# Patient Record
Sex: Female | Born: 1960 | Race: White | Hispanic: No | Marital: Married | Smoking: Never smoker
Health system: Southern US, Community
[De-identification: ages and names within clinical notes are randomized; demographics above are authoritative.]

## PROBLEM LIST (undated history)

## (undated) DIAGNOSIS — R112 Nausea with vomiting, unspecified: Secondary | ICD-10-CM

## (undated) DIAGNOSIS — Z8489 Family history of other specified conditions: Secondary | ICD-10-CM

## (undated) DIAGNOSIS — G4730 Sleep apnea, unspecified: Secondary | ICD-10-CM

## (undated) DIAGNOSIS — Z8639 Personal history of other endocrine, nutritional and metabolic disease: Secondary | ICD-10-CM

## (undated) DIAGNOSIS — F329 Major depressive disorder, single episode, unspecified: Secondary | ICD-10-CM

## (undated) DIAGNOSIS — M1990 Unspecified osteoarthritis, unspecified site: Secondary | ICD-10-CM

## (undated) DIAGNOSIS — K219 Gastro-esophageal reflux disease without esophagitis: Secondary | ICD-10-CM

## (undated) DIAGNOSIS — E119 Type 2 diabetes mellitus without complications: Secondary | ICD-10-CM

## (undated) DIAGNOSIS — S83209A Unspecified tear of unspecified meniscus, current injury, unspecified knee, initial encounter: Secondary | ICD-10-CM

## (undated) DIAGNOSIS — Z8669 Personal history of other diseases of the nervous system and sense organs: Secondary | ICD-10-CM

## (undated) DIAGNOSIS — M94262 Chondromalacia, left knee: Secondary | ICD-10-CM

## (undated) DIAGNOSIS — G709 Myoneural disorder, unspecified: Secondary | ICD-10-CM

## (undated) DIAGNOSIS — E039 Hypothyroidism, unspecified: Secondary | ICD-10-CM

## (undated) DIAGNOSIS — Z9889 Other specified postprocedural states: Secondary | ICD-10-CM

## (undated) DIAGNOSIS — F32A Depression, unspecified: Secondary | ICD-10-CM

## (undated) DIAGNOSIS — F419 Anxiety disorder, unspecified: Secondary | ICD-10-CM

## (undated) HISTORY — DX: Hypothyroidism, unspecified: E03.9

## (undated) HISTORY — PX: ABDOMINAL HYSTERECTOMY: SHX81

## (undated) HISTORY — DX: Anxiety disorder, unspecified: F41.9

## (undated) HISTORY — DX: Unspecified osteoarthritis, unspecified site: M19.90

## (undated) HISTORY — PX: HYSTEROSCOPY WITH D & C: SHX1775

## (undated) HISTORY — PX: NASAL SINUS SURGERY: SHX719

---
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
## 1982-12-08 HISTORY — PX: TUBAL LIGATION: SHX77

## 1985-12-08 HISTORY — PX: SEPTOPLASTY: SUR1290

## 1990-12-08 HISTORY — PX: OTHER SURGICAL HISTORY: SHX169

## 1992-12-08 HISTORY — PX: OTHER SURGICAL HISTORY: SHX169

## 1999-03-15 ENCOUNTER — Other Ambulatory Visit: Admission: RE | Admit: 1999-03-15 | Discharge: 1999-03-15 | Payer: Self-pay | Admitting: Obstetrics and Gynecology

## 2000-07-02 ENCOUNTER — Encounter: Payer: Self-pay | Admitting: Emergency Medicine

## 2000-07-02 ENCOUNTER — Emergency Department (HOSPITAL_COMMUNITY): Admission: EM | Admit: 2000-07-02 | Discharge: 2000-07-02 | Payer: Self-pay | Admitting: Emergency Medicine

## 2000-09-01 ENCOUNTER — Emergency Department (HOSPITAL_COMMUNITY): Admission: EM | Admit: 2000-09-01 | Discharge: 2000-09-02 | Payer: Self-pay | Admitting: Emergency Medicine

## 2000-09-22 ENCOUNTER — Encounter: Admission: RE | Admit: 2000-09-22 | Discharge: 2000-09-22 | Payer: Self-pay | Admitting: Gastroenterology

## 2000-09-22 ENCOUNTER — Encounter: Payer: Self-pay | Admitting: Gastroenterology

## 2000-12-08 HISTORY — PX: NEUROPLASTY / TRANSPOSITION MEDIAN NERVE AT CARPAL TUNNEL BILATERAL: SUR894

## 2001-09-06 ENCOUNTER — Encounter: Admission: RE | Admit: 2001-09-06 | Discharge: 2001-09-06 | Payer: Self-pay | Admitting: Family Medicine

## 2001-09-06 ENCOUNTER — Encounter: Payer: Self-pay | Admitting: Family Medicine

## 2004-02-14 ENCOUNTER — Encounter: Admission: RE | Admit: 2004-02-14 | Discharge: 2004-02-14 | Payer: Self-pay | Admitting: Family Medicine

## 2004-12-20 ENCOUNTER — Encounter: Admission: RE | Admit: 2004-12-20 | Discharge: 2004-12-20 | Payer: Self-pay | Admitting: Family Medicine

## 2005-04-11 ENCOUNTER — Observation Stay (HOSPITAL_COMMUNITY): Admission: EM | Admit: 2005-04-11 | Discharge: 2005-04-12 | Payer: Self-pay | Admitting: Emergency Medicine

## 2005-04-17 ENCOUNTER — Encounter: Admission: RE | Admit: 2005-04-17 | Discharge: 2005-04-17 | Payer: Self-pay | Admitting: Family Medicine

## 2005-04-21 ENCOUNTER — Ambulatory Visit (HOSPITAL_COMMUNITY): Admission: RE | Admit: 2005-04-21 | Discharge: 2005-04-21 | Payer: Self-pay | Admitting: Obstetrics and Gynecology

## 2005-05-16 ENCOUNTER — Encounter (INDEPENDENT_AMBULATORY_CARE_PROVIDER_SITE_OTHER): Payer: Self-pay | Admitting: *Deleted

## 2005-05-16 ENCOUNTER — Ambulatory Visit (HOSPITAL_COMMUNITY): Admission: RE | Admit: 2005-05-16 | Discharge: 2005-05-16 | Payer: Self-pay | Admitting: Obstetrics and Gynecology

## 2005-05-29 ENCOUNTER — Encounter: Admission: RE | Admit: 2005-05-29 | Discharge: 2005-05-29 | Payer: Self-pay | Admitting: Family Medicine

## 2005-06-12 ENCOUNTER — Encounter: Admission: RE | Admit: 2005-06-12 | Discharge: 2005-06-12 | Payer: Self-pay | Admitting: Gastroenterology

## 2005-07-02 ENCOUNTER — Ambulatory Visit (HOSPITAL_COMMUNITY): Admission: RE | Admit: 2005-07-02 | Discharge: 2005-07-02 | Payer: Self-pay | Admitting: Gastroenterology

## 2005-07-20 ENCOUNTER — Encounter: Admission: RE | Admit: 2005-07-20 | Discharge: 2005-07-20 | Payer: Self-pay | Admitting: Family Medicine

## 2005-11-06 ENCOUNTER — Inpatient Hospital Stay (HOSPITAL_COMMUNITY): Admission: RE | Admit: 2005-11-06 | Discharge: 2005-11-08 | Payer: Self-pay | Admitting: Obstetrics and Gynecology

## 2005-11-06 ENCOUNTER — Encounter (INDEPENDENT_AMBULATORY_CARE_PROVIDER_SITE_OTHER): Payer: Self-pay | Admitting: Specialist

## 2006-12-08 HISTORY — PX: GASTRIC BYPASS: SHX52

## 2007-06-07 ENCOUNTER — Encounter: Admission: RE | Admit: 2007-06-07 | Discharge: 2007-06-07 | Payer: Self-pay | Admitting: Family Medicine

## 2009-01-15 ENCOUNTER — Encounter
Admission: RE | Admit: 2009-01-15 | Discharge: 2009-01-15 | Payer: Self-pay | Admitting: Physical Medicine and Rehabilitation

## 2009-12-08 HISTORY — PX: OTHER SURGICAL HISTORY: SHX169

## 2010-12-29 ENCOUNTER — Encounter: Payer: Self-pay | Admitting: Gastroenterology

## 2011-04-25 NOTE — H&P (Signed)
Traci Bird, Traci Bird              ACCOUNT NO.:  [id number]   MEDICAL RECORD NO.:  [id number]          PATIENT TYPE:  INP   LOCATION:  1827                         FACILITY:  [facility]   PHYSICIAN:  Jonna L. Robb Matar, M.D.DATE OF BIRTH:  10/19/1961   DATE OF ADMISSION:  04/10/2005  DATE OF DISCHARGE:                                HISTORY & PHYSICAL   PHYSICIANS:  1.  Teena Irani. Arlyce Dice, M.D., primary care physician.  2.  [facility], cardiologist.   CHIEF COMPLAINT:  Chest pain.   HISTORY OF PRESENT ILLNESS:  This is a 50 year old diabetic white female who  has been having intermittent chest pain and pressure for the past month.  Usually it last for a half an hour and subsides; however, this episode  started around 1 o'clock in the morning, lasted for about five to six hours.  At 6 a.m., she began to get nauseous as well. She continued with this  midsternal chest pressure which she describes as a squeezing and then a  heaviness. It began to radiate down her arm. She had a shortness of breath  feeling and a feeling like she had a lump in her throat. When she tried to  exert herself she noted she got very short of breath. She finally came to  the emergency room at 5 p.m. about 16 hours after the pain started and it  improved with nitroglycerin.   PAST MEDICAL HISTORY:  1.  Type 2 diabetes. She has had this for about six years. Her glucoses      usually stay around 120.  2.  Hypothyroidism: In 1990, she had surgery for a tumor or enlargement. She      has been on Synthroid since then.  3.  Mild hearing loss.  4.  Probable early peripheral neuropathy:  She says her feet are not numb      but they feel like they are about to go numb and they just do not feel      quite right.   ALLERGIES:  None.   MEDICATIONS:  1.  Synthroid 88 mcg.  2.  Avandia 4 mg daily.  3.  Accupril 20 mg daily.  4.  Hydrochlorothiazide 25 mg daily.   PAST SURGICAL HISTORY:  1.  Tubal  ligation.  2.  Thyroid surgery.  3.  Bilateral breast reduction.  4.  Bilateral carpal tunnel releases.  5.  Surgery for deviated septum.   SOCIAL HISTORY:  Nonsmoker and nondrinker. She works as a Pensions consultant at  [employer]. She is married. She has two children and five  grandchildren.   FAMILY HISTORY:  Diabetes, coronary artery disease.   REVIEW OF SYMPTOMS:  She has problems with allergy and pollen. A couple of  weeks ago she had a lot of sinus headache and sinus drainage. She is having  problems with fibroids and metrorrhagia and is due to see Dr. Malachi Pro.  Henley for this. 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 She has had problems with fibrocystic disease in her  breast. She had  childhood asthma but she has not had any problems with it as  an adult. All other systems were reviewed and are negative.   PHYSICAL EXAMINATION:  VITAL SIGNS:  Initial temperature was read as 100.6  and has come down to 98.4, pulse 107, respirations 18, blood pressure  131/68. Saturating 96%.  GENERAL:  A well-developed, well-nourished morbidly overweight white female  who is complaining of a headache.  HEENT:  Conjunctivae and lids are normal. Pupils are reactive. Extraocular  movements are full. She has some slight decrease in hearing. Normal mucosa  and pharynx.  NECK:  Thyroidectomy scar. No masses or tenderness.  LUNGS:  Respiratory effort is normal. Lungs are clear to A&P without  wheezing, rales, or rhonchi, or dullness.  HEART:  Regular rate and rhythm. Normal S1 and S2 without murmurs, gallops,  or rubs. There is no cyanosis or clubbing. There may be some trace edema.  BREASTS:  Scars of reduction. There are no masses, tenderness, or discharge.  ABDOMEN:  Nontender. Normal bowel sounds. No hepatosplenomegaly or hernia.  GENITALIA:  Normal.  LYMPH NODES:  There is no cervical adenopathy.  MUSCULOSKELETAL:  Muscle strength is 5/5 with full range  of motion in all  four extremities.  SKIN:  Shows no rash, lesions or nodules.  NEUROLOGICAL:  Cranial nerves are intact. DTRs are 2+ in the knees. She can  feel touch in her feet but she says this does not feel quite right. Alert  and oriented. Normal memory, judgment, and affect.   LABORATORY DATA:  EKG shows sinus tachycardia. No ischemic changes. Cardiac  enzymes are negative x2.   Glucose is 239, BUN 15, creatinine 0.7, chest x-ray is negative. D-dimer is  pending.   IMPRESSION:  1.  Chest pain:  I will rule her out for pulmonary embolus and we will get a      stress test later. I spoke with Dr. Nicki Guadalajara this evening and he      agrees that [facility] will do her stress test      later today.  2.  Type 2 diabetes:  I will check her blood sugars and A1c. She may need      tighter control.  3.  Hypothyroidism postsurgery:  I will check her thyroid levels.  4.  Fever:  We will follow this along. I will check a urinalysis and culture      to try to see if we can find if there is a source for this low grade      fever.  5.  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      JLB/MEDQ  D:  04/11/2005  T:  04/11/2005  Job:  [id number]   cc:   Teena Irani. Arlyce Dice, M.D.  [address]  [place]  [place] [address]  Fax: [phone]

## 2011-04-25 NOTE — H&P (Signed)
Traci Bird, Traci Bird              ACCOUNT NO.:  [id number]   MEDICAL RECORD NO.:  [id number]          PATIENT TYPE:  INP   LOCATION:  NA                           FACILITY:  [facility]   PHYSICIAN:  Malachi Pro. Ambrose Mantle, M.D. DATE OF BIRTH:  09/15/1961   DATE OF ADMISSION:  11/06/2005  DATE OF DISCHARGE:                                HISTORY & PHYSICAL   HISTORY OF PRESENT ILLNESS:  This is a 50 year old white female, para 2-0-0-  2 who is admitted to the hospital for abdominal hysterectomy, bilateral  salpingo-oophorectomy because of severe menorrhagia and dysmenorrhea,  abnormal uterine bleeding and complex endometrial hyperplasia. Last  menstrual period October 23, 2005, bleeding last 9 or 10 days. The patient  has reported abnormal bleeding before in 2003. She reported that her periods  had been increased in flow and increased in pain for 3-5 years. She was  treated with Anaprox DS and was advised to call back in 30 days; however,  the next time I saw her was in May of 2006. At that time, she reported her  flow was still markedly increased using 14-15 tampons a day and 7-8 pads a  day. She would also miss work and she claimed dysmenorrhea was at a 7-8/10  level. She underwent an ultrasound of the pelvis and it showed a somewhat  asymmetric endometrial stripe with an echogenic area extending to the right  of the midline in the fundal region. Because the patient became sick with  venipuncture, she was scheduled to have a D&C hysteroscopy which she  underwent with pathological findings of benign endocervical tissue and  complex hyperplasia without atypia on the endometrial curetting's. She was  noted at hysteroscopy to have multiple endometrial polyps. The patient was  then placed on progesterone for 30 days on May 30, 2005. She called on July  12 complaining that she had been bleeding since the 24th of June. She came  for a hemoglobin. She was noted to have a hemoglobin of 10.8,  hematocrit of  33.2 with indices suggestive of iron deficiency. She was advised to take  iron sulfate. She then reported in October 01, 2005 that her menses were no  better. She continued to spot in between periods, have heavy flow, pass  clots the size of her hand, use 36 tampons in 2-3 days and use 9 pads per  day and still have bleeding around the pad. She wanted to proceed with  hysterectomy.   PAST MEDICAL HISTORY:  Revealed no known allergies.   MEDICATIONS:  Synthroid, Accupril, Avandia, hydrochlorothiazide, iron,  Prilosec.   PAST SURGICAL HISTORY:  Sinus surgery, T&A, thyroid surgery, carpal tunnel  syndrome, tubal ligation, D&C, breast reduction.   ILLNESSES:  The patient has had thyroid dysfunction, diabetes, acid reflux.  She works as a Pensions consultant at [employer]. She has no known heart  problems.   SOCIAL HISTORY:  Does not smoke or drink.   FAMILY HISTORY:  Mother is 85 with arthritis and high blood pressure. Father  37 with heart problems and diabetes. Two sisters, 1 is living and well and  1  has obesity. She has no brothers.   PHYSICAL EXAMINATION:  GENERAL:  Well-developed, obese, white female  weighing 264 pounds.  VITAL SIGNS:  Blood pressure 126/82 and pulse of 80.  HEENT:  Reveal no cranial abnormalities, extraocular movements intact. Nose  and pharynx are clear.  NECK:  Supple. No thyromegaly.  BREASTS:  Soft without masses sitting up or lying down. Bilateral reduction  scars are noted.  ABDOMEN:  Soft, markedly obese, not tender, no masses are palpable. Vulva  and vagina are clean. The cervix is clean. The uterus is hard to feel,  impossible to determine its size. The adnexa are free of masses.  RECTAL:  Negative in May of 2006.   IMPRESSION:  Persistent menorrhagia, dysmenorrhea, abnormal uterine  bleeding, history of complex hyperplasia without atypia.   PLAN:  The patient is admitted for abdominal hysterectomy, bilateral   salpingo-oophorectomy. She has chosen to have her ovaries removed after a  long discussion about the pros and cons of leaving the ovaries and she  prefers to have an abdominal incision. She has been informed of the risks of  surgery including but not limited to heart attack, stroke, pulmonary  embolus, wound disruption, hemorrhage with need for reoperation and/or  transfusion, fistula formation, nerve injury, intestinal obstruction. She  also has been informed that the surgery can have an unpredictable impact on  sex drive.      Malachi Pro. Ambrose Mantle, M.D.  Electronically Signed     TFH/MEDQ  D:  11/05/2005  T:  11/05/2005  Job:  [id number]

## 2011-04-25 NOTE — Op Note (Signed)
NAMEELLANORE, VANHOOK              ACCOUNT NO.:  [id number]   MEDICAL RECORD NO.:  [id number]          PATIENT TYPE:  AMB   LOCATION:  ENDO                         FACILITY:  [facility]   PHYSICIAN:  Danise Edge, M.D.   DATE OF BIRTH:  Oct 13, 1961   DATE OF PROCEDURE:  07/02/2005  DATE OF DISCHARGE:                                 OPERATIVE REPORT   PROCEDURE:  Esophagogastroduodenoscopy   PROCEDURE INDICATIONS:  Ms. Brittish Bolinger is a 50 year old female, born  [birth year].  Ms. Seliga underwent a barium tablet/liquid barium  swallow with upper GI x-ray series to evaluate intermittent solid food  dysphagia.  The barium tablet traversed the esophagus without obstruction.  The barium upper GI x-ray series was normal except for mucosal irregularity  in the cervical esophagus.   ENDOSCOPIST:  Reece Agar, M.D.   PREMEDICATION:  Versed 7.5 mg, Demerol 50 mg.   PROCEDURE:  After obtaining informed consent, Ms. Blizzard was placed in the  left lateral decubitus position.  I administered intravenous Demerol and  intravenous Versed to achieve conscious sedation for the procedure.  The  patient's blood pressure, oxygen saturation and cardiac rhythm were  monitored throughout the procedure and documented in the medical record.   The Olympus gastroscope was passed through the posterior hypopharynx into  the proximal esophagus without difficulty.  The hypopharynx, larynx and  vocal cords appeared normal.   ESOPHAGOSCOPY:  The proximal, mid and lower segments of the esophageal  mucosa appear completely normal.  There is no endoscopic evidence for  Barrett's esophagus, erosive esophagitis or esophageal mucosal  scarring/stricture formation.  The squamocolumnar junction and  esophagogastric junction are noted at 35 cm from the incisor teeth.   GASTROSCOPY:  Retroflexed view of the gastric cardia and fundus was normal.  The gastric body appears normal.  There are 3 superficial linear  erosions in  the proximal gastric antrum.  The pylorus appears normal.   DUODENOSCOPY:  The duodenal bulb and descending duodenum appeared normal.   ASSESSMENT:  Normal esophagogastroduodenoscopy except for 3 superficial  linear erosions in the gastric antrum.  The esophageal mucosa appears  completely normal throughout its length.  There is no mucosal inflammation,  stricture formation or signs of Barrett's esophagus.       MJ/MEDQ  D:  07/02/2005  T:  07/02/2005  Job:  [id number]   cc:   Teena Irani. Arlyce Dice, M.D.  [address]  [place]  [place] [address]  Fax: [phone]

## 2011-04-25 NOTE — Op Note (Signed)
Traci Bird, Traci Bird              ACCOUNT NO.:  [id number]   MEDICAL RECORD NO.:  [id number]          PATIENT TYPE:  AMB   LOCATION:  DAY                          FACILITY:  [facility]   PHYSICIAN:  Malachi Pro. Ambrose Mantle, M.D. DATE OF BIRTH:  Jul 14, 1961   DATE OF PROCEDURE:  05/16/2005  DATE OF DISCHARGE:                                 OPERATIVE REPORT   PREOPERATIVE DIAGNOSES:  Menorrhagia, dysmenorrhea, abnormal uterine  bleeding.   POSTOPERATIVE DIAGNOSES:  Menorrhagia, dysmenorrhea, abnormal uterine  bleeding with endometrial polyps.   OPERATION:  D&C hysteroscopy, removal of polyps.   OPERATOR:  Malachi Pro. Ambrose Mantle, M.D.   ANESTHESIA:  General anesthesia.   The patient was brought to the operating room and placed under satisfactory  general anesthesia and placed in lithotomy position. The patient's obesity  markedly compromised the exam. As far as could be determined, the uterus was  hard to determine its size, the adnexa were free of masses. The vulva,  vagina, perineum and medial thighs were prepped with Betadine solution. The  cervix was exposed, drawn into the operative field and sounded to 11 cm  slightly anteriorly. The cervical canal was dilated with Shawnie Pons dilators to a  #33 Shawnie Pons allowing introduction of the resectoscope if needed. I did an  endocervical followed by an endometrial curettage after looking with the  hysteroscope seeing no suggestion of fibroids with multiple polypoid  appearing structures. After the endocervical and endometrial curettage were  done, I used the polyp forceps to remove a significant amount of polypoid  tissue. I then relooked with the hysteroscope, there was no significant  tissue left and the procedure was terminated. The sorbitol deficit was 20  mL. The  procedure was terminated and the patient was returned to recovery  in satisfactory condition.       TFH/MEDQ  D:  05/16/2005  T:  05/16/2005  Job:  [id number]

## 2011-04-25 NOTE — Op Note (Signed)
NAMEPRECILLA, PURNELL              ACCOUNT NO.:  [id number]   MEDICAL RECORD NO.:  [id number]          PATIENT TYPE:  INP   LOCATION:  1606                         FACILITY:  [facility]   PHYSICIAN:  Malachi Pro. Ambrose Mantle, M.D. DATE OF BIRTH:  February 10, 1961   DATE OF PROCEDURE:  11/06/2005  DATE OF DISCHARGE:                                 OPERATIVE REPORT   PREOPERATIVE DIAGNOSES:  1.  Menorrhagia, dysmenorrhea, abnormal uterine bleeding and anemia.  2.  Complex endometrial hyperplasia without atypia.   POSTOPERATIVE DIAGNOSES:  1.  Menorrhagia, dysmenorrhea, abnormal uterine bleeding and anemia.  2.  Complex endometrial hyperplasia without atypia.   OPERATION:  Abdominal hysterectomy, bilateral salpingo-oophorectomy.   SURGEON:  Malachi Pro. Ambrose Mantle, M.D.   Threasa HeadsSenaida Ores.   ANESTHESIA:  General.   DESCRIPTION OF PROCEDURE:  The patient was brought to the operating room and  placed under satisfactory general anesthesia.  She was placed in all supine  on the table in frog-leg position.  The abdomen, vulva, vagina and perineum  were prepped with Betadine solution. The urethra was prepped, and a Foley  catheter was inserted to straight drain.  The patient was then taken out of  frog-leg position, placed supine.  The abdomen was draped as a sterile  field.  She had quite a panniculus.  We chose to make the incision above the  panniculus in a transverse manner and carried in layers through the skin and  subcutaneous tissue and the fascia.  The rectus muscle was already split in  the midline.  The peritoneum was opened vertically.  I could not do a good  job of examining the upper abdomen because of a fairly small incision and  did not feel the liver.  I did not feel any major abnormalities but I was  unable to palpate the kidneys for sure.  Exploration of the pelvis revealed  the uterus to have clear blebs on its surface. Initially I thought there was  some nodularity in the cul-de-sac  but I could not confirm this later.  There  was a possibility of endometriosis on the right ovary surface.  Both tubes  showed evidence of previous tubal ligation.  There were some adhesions  around the left tube and ovary attached to the sigmoid colon.  I used the  wound retractor and confirmed that there was no bowel adherent in the  retractor.  I then clamped across the upper pedicles, elevated the uterus  into the operative field, divided the right round ligament, the right  infundibulopelvic ligament and doubly suture ligated it.  The left side was  harder to access because of the adhesions.  These adhesions were divided.  The round left round ligament was still hard to get to.  I divided it  between clamps and then suture ligated it.  I then divided the left  infundibulopelvic ligament and doubly suture ligated it.  I clamped cut and  suture ligated the uterine vessels bilaterally and then worked my way down  to the uterosacral ligaments, clamped, cut and suture ligated them, and  entered the right vaginal  angle.  I then removed the uterus, by transecting  the upper vagina.  I had already removed the tubes and ovaries from the  operative field.  Vaginal angle sutures were placed in the central portion.  The cuff was closed with interrupted figure-of-eight sutures of 0 Vicryl.  Liberal irrigation confirmed hemostasis with some difficulty we were able to  palpate both ureters and felt that they were well free of the operative  field and normal in size.  I then sutured the uterosacral ligaments together  in the midline including the posterior cul-de-sac peritoneum in the suture.  Hemostasis was adequate.  Packs and retractors were removed. The peritoneum  was  closed with a running suture of 0 Vicryl. The fascia was closed with two  running sutures of 0 Vicryl, subcu with a running 3-0 Vicryl and staples  were used for the skin.  The patient seemed to tolerate the procedure well.  Blood  loss about 300 cc.  Sponge and needle counts were correct.  She was  returned to recovery in satisfactory condition.      Malachi Pro. Ambrose Mantle, M.D.  Electronically Signed     TFH/MEDQ  D:  11/06/2005  T:  11/07/2005  Job:  [id number]

## 2011-04-25 NOTE — Discharge Summary (Signed)
NAMEAROHI, SALVATIERRA              ACCOUNT NO.:  [id number]   MEDICAL RECORD NO.:  [id number]          PATIENT TYPE:  INP   LOCATION:  4714                         FACILITY:  [facility]   PHYSICIAN:  Elliot Cousin, M.D.    DATE OF BIRTH:  1961-05-19   DATE OF ADMISSION:  04/10/2005  DATE OF DISCHARGE:  04/12/2005                                 DISCHARGE SUMMARY   DISCHARGE DIAGNOSES:  1.  Chest pain, myocardial infarction ruled out. Cardiolite stress test      negative.  2.  Gastroesophageal reflux disease.  3.  Hyperlipidemia.  4.  Hypertension.  5.  Hypothyroidism.  6.  Type 2 diabetes mellitus.  7.  Obesity.  8.  Mild hearing loss.  9.  Status post tubal ligation in the past.  10. Status post thyroid surgery in the past.  11. Status post bilateral breast reduction in the past.  12. Status post bilateral carpal tunnel releases in the past.   DISCHARGE MEDICATIONS:  1.  Lipitor  20 mg q.h.s.  2.  Accupril 20 mg q.d.  3.  HCTZ 25 mg q.d.  4.  Avandia 4 mg q.d.  5.  Synthroid 88 mcg q.d.  6.  Aspirin 81 mg q.d.  7.  Prilosec OTC 20 mg q.d.   DISCHARGE DISPOSITION:  The patient was discharged home in improved and  stable condition on May6,2006. The patient was advised to follow up with her  primary care physician, Dr. Arlyce Dice, in one week.   CONSULTATIONS:  [facility] (only for Cardiolite stress  test).   PROCEDURES PERFORMED:  CARDIOLITE STRESS TEST. The results revealed probable  attenuation involving the anterior wall. No evidence for ischemia. Ejection  fraction is 66%.   HISTORY OF PRESENT ILLNESS:  The patient is a 50 year old lady with a past  medical history significant for type 2 diabetes mellitus, who presented to  the emergency department on [id number] with the chief complaint of chest  pain. Given the patient's risk factors, she was admitted for further  evaluation and management.   HOSPITAL COURSE:  PROBLEM #1 - CHEST PAIN.  The patient  was started  empirically on intravenous heparin and intravenous nitroglycerin for what  was thought to be angina. The patient was continued on her antihypertensive  medications: Accupril and HCTZ. She was treated with an aspirin 325 mg  daily. The patient's EKG on admission revealed sinus tachycardia with a  heart rate of 105 beats per minute and a low voltage QRS complex in the  lateral leads. There were no ST-T wave abnormalities. Cardiac enzymes were  ordered to rule out myocardial infarction. The cardiac enzymes were  completely negative. The patient's thyroid function tests were also  evaluated. The TSH was within normal limits at 1.366 and the total T4 was  within normal limits at 9.9. The patient's fasting lipid profile was  assessed and revealed a total cholesterol of 202, triglycerides of 82, HDL  cholesterol of 49, and LDL cholesterol of 137. The patient was therefore  started on Lipitor 20 mg q.h.s.  For further evaluation, a Cardiolite stress  test was ordered. [facility]ology was consulted  primarily for providing the stress test. The Cardiolite stress test was  negative for ischemia. The ejection fraction was 66%.   The patient's chest pain resolved during the hospital course. The patient  mentioned that she did have a history of heartburn, which has been  intermittent over the past few months. A possible explanation for the  patient's chest pain is gastroesophageal reflux disease symptoms. The  patient was therefore advised to start Prilosec OTC 20 mg daily. Of note,  the patient's D-dimer was mildly elevated at 0.73. The patient had no  evidence of hypoxemia on exam and she had no evidence of bilateral lower  extremity edema or unilateral lower extremity edema pain or erythema.  Therefore, a CT scan of the chest and venous Doppler dopplers were not  ordered.   PROBLEM #2 - TYPE 2 DIABETES MELLITUS . The  patient's capillary blood  sugars were  well-controlled during the hospital course on Avandia. Her  hemoglobin A1C was 6.8.   DISCHARGE LABORATORY DATA:  Sodium 142, potassium 4.1, chloride 105, CO2 28,  glucose 104, BUN 8, creatinine 0.8, calcium 9.1, WBC 7.3, hemoglobin 11.1,  hematocrit 33.6, platelets 229.      DF/MEDQ  D:  04/12/2005  T:  04/13/2005  Job:  [id number]   cc:   Teena Irani. Arlyce Dice, M.D.  [address]  [place]  [place] [address]  Fax: [phone]

## 2011-04-25 NOTE — Discharge Summary (Signed)
Traci Bird, Traci Bird              ACCOUNT NO.:  [id number]   MEDICAL RECORD NO.:  [id number]          PATIENT TYPE:  INP   LOCATION:  1606                         FACILITY:  [facility]   PHYSICIAN:  Malachi Pro. Ambrose Mantle, M.D. DATE OF BIRTH:  18-Aug-1961   DATE OF ADMISSION:  11/06/2005  DATE OF DISCHARGE:  11/08/2005                                 DISCHARGE SUMMARY   This is a 50 year old white female admitted for abdominal hysterectomy and  bilateral salpingo-oophorectomy because of persistent menorrhagia,  dysmenorrhea abnormal uterine bleeding and anemia after a D&C and  hysteroscopy and findings of complex endometrial hyperplasia without atypia  at the [facility]. The patient underwent abdominal hysterectomy, bilateral salpingo-  oophorectomy on November 06, 2005 and did well postoperatively.  She did  have a temperature elevation of 100.8 at 6:00 a.m. on the second  postoperative day. Also her pulse was charted as 26 which made me suspect  that they had switched her vital signs with another patient, so I kept her  throughout the day and took vital signs throughout the day, and she never  had a fever higher than 98.7. She has passed flatus, her abdomen is soft and  nontender. She is ambulating well without difficulty, voiding well and is  ready for discharge. Staples were left in at the time of discharge. Initial  hemoglobin 11.7, hematocrit 36.8, white count 6000, platelet count 288,000,  66 segs, 27 lymphs, 5 mono's, one eosinophil and one basophil.  Comprehensive metabolic profile normal except for a glucose of 149.  Pregnancy test was negative. Urinalysis was negative. Follow-up hematocrits  32.4 and 31.2. Chest x-ray showed low volumes without acute pulmonary  process. EKG showed a sinus tachycardia, low voltage QRS, borderline EKG. No  old tracings to compare.  Pathology report showed uterus, cervix, both tubes  and ovaries. Cervix had slight cervicitis and squamous metaplasia.  Endometrium with focal simple and complex hyperplasia without atypia. No  evidence of atypia or carcinoma, focal adenomyosis, leiomyomata, intramural  focal uterine serosal, endosalpingiosis, no evidence of malignancy right and  left ovaries. Focal serosal endosalpingiosis corpus luteum and benign  follicular cyst, no evidence of malignancy. Right and left fallopian tubes -  findings consistent with previous tubal ligation.   FINAL DIAGNOSES:  Menorrhagia, dysmenorrhea, abnormal uterine bleeding,  complex endometrial hyperplasia without atypia.  1.  Diabetes mellitus.  2.  Obesity.   OPERATION:  Abdominal hysterectomy, bilateral salpingo-oophorectomy.   FINAL CONDITION:  Improved.   INSTRUCTIONS:  No vaginal entrance, no heavy lifting or strenuous activity.  Call with any temperature elevation greater than 100.4 degrees. Call with  any heavy vaginal bleeding or any unusual problems. The patient is advised  to return to the office in 3 days for follow-up examination.   DISCHARGE MEDICATIONS:  She is given a prescription for Demerol 50  milligrams 24 tablets one every 4-6 hours as needed for pain and she is  advised to continue the medications that she came to the hospital taking.  These include Synthroid, hydrochlorothiazide, Avandia, quinapril, ferrous  sulfate, tizanidine and Prilosec.      Malachi Pro.  Ambrose Mantle, M.D.  Electronically Signed     TFH/MEDQ  D:  11/08/2005  T:  11/08/2005  Job:  [id number]

## 2011-07-14 ENCOUNTER — Other Ambulatory Visit: Payer: Self-pay | Admitting: Family Medicine

## 2011-07-14 DIAGNOSIS — Z1231 Encounter for screening mammogram for malignant neoplasm of breast: Secondary | ICD-10-CM

## 2011-08-01 ENCOUNTER — Ambulatory Visit
Admission: RE | Admit: 2011-08-01 | Discharge: 2011-08-01 | Disposition: A | Discharge status: Home / Self Care | Payer: 59 | Source: Ambulatory Visit | Attending: Family Medicine | Admitting: Family Medicine

## 2011-08-01 DIAGNOSIS — Z1231 Encounter for screening mammogram for malignant neoplasm of breast: Secondary | ICD-10-CM

## 2011-10-15 ENCOUNTER — Ambulatory Visit (AMBULATORY_SURGERY_CENTER): Payer: 59 | Admitting: *Deleted

## 2011-10-15 ENCOUNTER — Encounter: Payer: Self-pay | Admitting: Gastroenterology

## 2011-10-15 VITALS — Ht 61.0 in | Wt 176.9 lb

## 2011-10-15 DIAGNOSIS — Z1211 Encounter for screening for malignant neoplasm of colon: Secondary | ICD-10-CM

## 2011-10-15 MED ORDER — PEG-KCL-NACL-NASULF-NA ASC-C 100 G PO SOLR: ORAL | Status: DC

## 2011-10-29 ENCOUNTER — Ambulatory Visit (AMBULATORY_SURGERY_CENTER): Payer: 59 | Admitting: Gastroenterology

## 2011-10-29 ENCOUNTER — Encounter: Payer: Self-pay | Admitting: Gastroenterology

## 2011-10-29 VITALS — BP 131/83 | HR 89 | Temp 97.3°F | Resp 20 | Ht 61.0 in | Wt 176.0 lb

## 2011-10-29 DIAGNOSIS — D126 Benign neoplasm of colon, unspecified: Secondary | ICD-10-CM

## 2011-10-29 DIAGNOSIS — Z1211 Encounter for screening for malignant neoplasm of colon: Secondary | ICD-10-CM

## 2011-10-29 DIAGNOSIS — K5730 Diverticulosis of large intestine without perforation or abscess without bleeding: Secondary | ICD-10-CM

## 2011-10-29 MED ORDER — SODIUM CHLORIDE 0.9 % IV SOLN: 500.0000 mL | INTRAVENOUS | Status: DC

## 2011-10-29 NOTE — Progress Notes (Signed)
Patient did not experience any of the following events: a burn prior to discharge; a fall within the facility; wrong site/side/patient/procedure/implant event; or a hospital transfer or hospital admission upon discharge from the facility. (G8907) Patient did not have preoperative order for IV antibiotic SSI prophylaxis. (G8918)  

## 2011-10-29 NOTE — Patient Instructions (Signed)
Please refer to your blue and neon green sheets for instructions regarding diet and activity for the rest of today.  You may resume your medications as you would normally take them.   You will receive a letter in the mail in about two weeks regarding the results of the biopsies taken today.  Diverticulosis Diverticulosis is a common condition that develops when small pouches (diverticula) form in the wall of the colon. The risk of diverticulosis increases with age. It happens more often in people who eat a low-fiber diet. Most individuals with diverticulosis have no symptoms. Those individuals with symptoms usually experience abdominal pain, constipation, or loose stools (diarrhea). HOME CARE INSTRUCTIONS   Increase the amount of fiber in your diet as directed by your caregiver or dietician. This may reduce symptoms of diverticulosis.   Your caregiver may recommend taking a dietary fiber supplement.   Drink at least 6 to 8 glasses of water each day to prevent constipation.   Try not to strain when you have a bowel movement.   Your caregiver may recommend avoiding nuts and seeds to prevent complications, although this is still an uncertain benefit.   Only take over-the-counter or prescription medicines for pain, discomfort, or fever as directed by your caregiver.  FOODS WITH HIGH FIBER CONTENT INCLUDE:  Fruits. Apple, peach, pear, tangerine, raisins, prunes.   Vegetables. Brussels sprouts, asparagus, broccoli, cabbage, carrot, cauliflower, romaine lettuce, spinach, summer squash, tomato, winter squash, zucchini.   Starchy Vegetables. Baked beans, kidney beans, lima beans, split peas, lentils, potatoes (with skin).   Grains. Whole wheat bread, brown rice, bran flake cereal, plain oatmeal, white rice, shredded wheat, bran muffins.  SEEK IMMEDIATE MEDICAL CARE IF:   You develop increasing pain or severe bloating.   You have an oral temperature above 102 F (38.9 C), not controlled by  medicine.   You develop vomiting or bowel movements that are bloody or black.  Document Released: 08/21/2004 Document Revised: 08/06/2011 Document Reviewed: 04/24/2010 Exit[employer] Patient Information 2012 [employer].  Colon Polyps A polyp is extra tissue that grows inside your body. Colon polyps grow in the large intestine. The large intestine, also called the colon, is part of your digestive system. It is a long, hollow tube at the end of your digestive tract where your body makes and stores stool. Most polyps are not dangerous. They are benign. This means they are not cancerous. But over time, some types of polyps can turn into cancer. Polyps that are smaller than a pea are usually not harmful. But larger polyps could someday become or may already be cancerous. To be safe, doctors remove all polyps and test them.  WHO GETS POLYPS? Anyone can get polyps, but certain people are more likely than others. You may have a greater chance of getting polyps if:  You are over 50.   You have had polyps before.   Someone in your family has had polyps.   Someone in your family has had cancer of the large intestine.   Find out if someone in your family has had polyps. You may also be more likely to get polyps if you:   Eat a lot of fatty foods.   Smoke.   Drink alcohol.   Do not exercise.   Eat too much.  SYMPTOMS  Most small polyps do not cause symptoms. People often do not know they have one until their caregiver finds it during a regular checkup or while testing them for something else. Some people do   have symptoms like these:  Bleeding from the anus. You might notice blood on your underwear or on toilet paper after you have had a bowel movement.   Constipation or diarrhea that lasts more than a week.   Blood in the stool. Blood can make stool look black or it can show up as red streaks in the stool.  If you have any of these symptoms, see your caregiver. HOW DOES THE DOCTOR TEST FOR  POLYPS? The doctor can use four tests to check for polyps:  Digital rectal exam. The caregiver wears gloves and checks your rectum (the last part of the large intestine) to see if it feels normal. This test would find polyps only in the rectum. Your caregiver may need to do one of the other tests listed below to find polyps higher up in the intestine.   Barium enema. The caregiver puts a liquid called barium into your rectum before taking x-rays of your large intestine. Barium makes your intestine look white in the pictures. Polyps are dark, so they are easy to see.   Sigmoidoscopy. With this test, the caregiver can see inside your large intestine. A thin flexible tube is placed into your rectum. The device is called a sigmoidoscope, which has a light and a tiny video camera in it. The caregiver uses the sigmoidoscope to look at the last third of your large intestine.   Colonoscopy. This test is like sigmoidoscopy, but the caregiver looks at all of the large intestine. It usually requires sedation. This is the most common method for finding and removing polyps.  TREATMENT   The caregiver will remove the polyp during sigmoidoscopy or colonoscopy. The polyp is then tested for cancer.   If you have had polyps, your caregiver may want you to get tested regularly in the future.  PREVENTION  There is not one sure way to prevent polyps. You might be able to lower your risk of getting them if you:  Eat more fruits and vegetables and less fatty food.   Do not smoke.   Avoid alcohol.   Exercise every day.   Lose weight if you are overweight.   Eating more calcium and folate can also lower your risk of getting polyps. Some foods that are rich in calcium are milk, cheese, and broccoli. Some foods that are rich in folate are chickpeas, kidney beans, and spinach.   Aspirin might help prevent polyps. Studies are under way.  Document Released: 08/20/2004 Document Revised: 08/06/2011 Document Reviewed:  01/26/2008 Exit[employer] Patient Information 2012 [employer]. 

## 2011-11-03 ENCOUNTER — Telehealth: Payer: Self-pay | Admitting: *Deleted

## 2011-11-03 NOTE — Telephone Encounter (Signed)

## 2012-10-19 ENCOUNTER — Other Ambulatory Visit: Payer: Self-pay | Admitting: Specialist

## 2012-10-22 ENCOUNTER — Encounter (HOSPITAL_BASED_OUTPATIENT_CLINIC_OR_DEPARTMENT_OTHER): Payer: Self-pay | Admitting: *Deleted

## 2012-10-22 NOTE — Progress Notes (Signed)
NPO AFTER MN WITH EXCEPTION WATER/ GATORADE/ COFFEE WITH NO CREAM OR MILK PRODUCTS UNTIL 0730. ARRIVES AT 1130. NEEDS HG. WILL TAKE SYNTHROID AM OF  SURG W/ SIP OF WATER.

## 2012-10-29 ENCOUNTER — Encounter (HOSPITAL_BASED_OUTPATIENT_CLINIC_OR_DEPARTMENT_OTHER)
Admission: RE | Disposition: A | Discharge status: Home / Self Care | Payer: Self-pay | Source: Ambulatory Visit | Attending: Specialist

## 2012-10-29 ENCOUNTER — Encounter (HOSPITAL_BASED_OUTPATIENT_CLINIC_OR_DEPARTMENT_OTHER): Payer: Self-pay | Admitting: Anesthesiology

## 2012-10-29 ENCOUNTER — Ambulatory Visit (HOSPITAL_BASED_OUTPATIENT_CLINIC_OR_DEPARTMENT_OTHER)
Admission: RE | Admit: 2012-10-29 | Discharge: 2012-10-29 | Disposition: A | Discharge status: Home / Self Care | Payer: 59 | Source: Ambulatory Visit | Attending: Specialist | Admitting: Specialist

## 2012-10-29 ENCOUNTER — Ambulatory Visit (HOSPITAL_BASED_OUTPATIENT_CLINIC_OR_DEPARTMENT_OTHER): Payer: 59 | Admitting: Anesthesiology

## 2012-10-29 ENCOUNTER — Encounter (HOSPITAL_BASED_OUTPATIENT_CLINIC_OR_DEPARTMENT_OTHER): Payer: Self-pay

## 2012-10-29 DIAGNOSIS — M2240 Chondromalacia patellae, unspecified knee: Secondary | ICD-10-CM | POA: Insufficient documentation

## 2012-10-29 DIAGNOSIS — IMO0002 Reserved for concepts with insufficient information to code with codable children: Secondary | ICD-10-CM | POA: Insufficient documentation

## 2012-10-29 DIAGNOSIS — G4730 Sleep apnea, unspecified: Secondary | ICD-10-CM | POA: Insufficient documentation

## 2012-10-29 DIAGNOSIS — E039 Hypothyroidism, unspecified: Secondary | ICD-10-CM | POA: Insufficient documentation

## 2012-10-29 DIAGNOSIS — X58XXXA Exposure to other specified factors, initial encounter: Secondary | ICD-10-CM | POA: Insufficient documentation

## 2012-10-29 DIAGNOSIS — E119 Type 2 diabetes mellitus without complications: Secondary | ICD-10-CM | POA: Insufficient documentation

## 2012-10-29 DIAGNOSIS — Z79899 Other long term (current) drug therapy: Secondary | ICD-10-CM | POA: Insufficient documentation

## 2012-10-29 DIAGNOSIS — Z9884 Bariatric surgery status: Secondary | ICD-10-CM | POA: Insufficient documentation

## 2012-10-29 DIAGNOSIS — S83249A Other tear of medial meniscus, current injury, unspecified knee, initial encounter: Secondary | ICD-10-CM

## 2012-10-29 HISTORY — DX: Unspecified tear of unspecified meniscus, current injury, unspecified knee, initial encounter: S83.209A

## 2012-10-29 HISTORY — DX: Chondromalacia, left knee: M94.262

## 2012-10-29 HISTORY — DX: Personal history of other diseases of the nervous system and sense organs: Z86.69

## 2012-10-29 HISTORY — DX: Personal history of other endocrine, nutritional and metabolic disease: Z86.39

## 2012-10-29 HISTORY — PX: KNEE ARTHROSCOPY: SHX127

## 2012-10-29 HISTORY — PX: KNEE ARTHROSCOPY WITH LATERAL RELEASE: SHX5649

## 2012-10-29 SURGERY — ARTHROSCOPY, KNEE
Anesthesia: General | Site: Knee | Laterality: Left | Wound class: Clean

## 2012-10-29 MED ORDER — ONDANSETRON HCL 4 MG/2ML IJ SOLN
INTRAMUSCULAR | Status: DC | PRN
  Administered 2012-10-29: 4 mg via INTRAVENOUS

## 2012-10-29 MED ORDER — MIDAZOLAM HCL 5 MG/5ML IJ SOLN
INTRAMUSCULAR | Status: DC | PRN
  Administered 2012-10-29: 2 mg via INTRAVENOUS

## 2012-10-29 MED ORDER — LACTATED RINGERS IV SOLN
INTRAVENOUS | Status: DC
  Administered 2012-10-29: 13:00:00 via INTRAVENOUS
  Filled 2012-10-29: qty 1000

## 2012-10-29 MED ORDER — HYDROMORPHONE HCL PF 1 MG/ML IJ SOLN
0.2500 mg | INTRAMUSCULAR | Status: DC | PRN
  Administered 2012-10-29: 0.25 mg via INTRAVENOUS
  Filled 2012-10-29: qty 1

## 2012-10-29 MED ORDER — SODIUM CHLORIDE 0.9 % IR SOLN
Status: DC | PRN
  Administered 2012-10-29: 15:00:00

## 2012-10-29 MED ORDER — LACTATED RINGERS IV SOLN
INTRAVENOUS | Status: DC | PRN
  Administered 2012-10-29 (×2): via INTRAVENOUS

## 2012-10-29 MED ORDER — SCOPOLAMINE 1 MG/3DAYS TD PT72
1.0000 | MEDICATED_PATCH | TRANSDERMAL | Status: DC
  Administered 2012-10-29: 1.5 mg via TRANSDERMAL

## 2012-10-29 MED ORDER — FENTANYL CITRATE 0.05 MG/ML IJ SOLN
INTRAMUSCULAR | Status: DC | PRN
  Administered 2012-10-29: 100 ug via INTRAVENOUS
  Administered 2012-10-29: 50 ug via INTRAVENOUS
  Administered 2012-10-29 (×2): 25 ug via INTRAVENOUS

## 2012-10-29 MED ORDER — KETOROLAC TROMETHAMINE 30 MG/ML IJ SOLN
INTRAMUSCULAR | Status: DC | PRN
  Administered 2012-10-29: 30 mg via INTRAVENOUS

## 2012-10-29 MED ORDER — OXYCODONE HCL 5 MG/5ML PO SOLN
5.0000 mg | Freq: Once | ORAL | Status: AC | PRN
  Filled 2012-10-29: qty 5

## 2012-10-29 MED ORDER — ACETAMINOPHEN 10 MG/ML IV SOLN
1000.0000 mg | Freq: Once | INTRAVENOUS | Status: AC | PRN
  Administered 2012-10-29: 1000 mg via INTRAVENOUS

## 2012-10-29 MED ORDER — DEXAMETHASONE SODIUM PHOSPHATE 4 MG/ML IJ SOLN
INTRAMUSCULAR | Status: DC | PRN
  Administered 2012-10-29: 10 mg via INTRAVENOUS

## 2012-10-29 MED ORDER — BUPIVACAINE-EPINEPHRINE 0.5% -1:200000 IJ SOLN
INTRAMUSCULAR | Status: DC | PRN
  Administered 2012-10-29: 20 mL

## 2012-10-29 MED ORDER — CEFAZOLIN SODIUM-DEXTROSE 2-3 GM-% IV SOLR
2.0000 g | INTRAVENOUS | Status: DC
  Filled 2012-10-29: qty 50

## 2012-10-29 MED ORDER — MEPERIDINE HCL 25 MG/ML IJ SOLN
6.2500 mg | INTRAMUSCULAR | Status: DC | PRN
  Filled 2012-10-29: qty 1

## 2012-10-29 MED ORDER — PROPOFOL 10 MG/ML IV BOLUS
INTRAVENOUS | Status: DC | PRN
  Administered 2012-10-29: 200 mg via INTRAVENOUS

## 2012-10-29 MED ORDER — PROMETHAZINE HCL 25 MG/ML IJ SOLN
6.2500 mg | INTRAMUSCULAR | Status: DC | PRN
  Filled 2012-10-29: qty 1

## 2012-10-29 MED ORDER — SODIUM CHLORIDE 0.9 % IR SOLN
Status: DC | PRN
  Administered 2012-10-29: 6000 mL

## 2012-10-29 MED ORDER — OXYCODONE HCL 5 MG PO TABS
5.0000 mg | ORAL_TABLET | Freq: Once | ORAL | Status: AC | PRN
  Administered 2012-10-29: 5 mg via ORAL
  Filled 2012-10-29: qty 1

## 2012-10-29 MED ORDER — OXYCODONE-ACETAMINOPHEN 5-325 MG PO TABS: 1.0000 | ORAL_TABLET | ORAL | Status: DC | PRN

## 2012-10-29 MED ORDER — CHLORHEXIDINE GLUCONATE 4 % EX LIQD
60.0000 mL | Freq: Once | CUTANEOUS | Status: DC
  Filled 2012-10-29: qty 60

## 2012-10-29 MED ORDER — LACTATED RINGERS IV SOLN
INTRAVENOUS | Status: DC
  Filled 2012-10-29: qty 1000

## 2012-10-29 MED ORDER — LIDOCAINE HCL (CARDIAC) 20 MG/ML IV SOLN
INTRAVENOUS | Status: DC | PRN
  Administered 2012-10-29: 100 mg via INTRAVENOUS

## 2012-10-29 SURGICAL SUPPLY — 44 items
BANDAGE ELASTIC 6 VELCRO ST LF (GAUZE/BANDAGES/DRESSINGS) ×2 IMPLANT
BLADE 4.2CUDA (BLADE) IMPLANT
BLADE CUDA SHAVER 3.5 (BLADE) ×2 IMPLANT
BLADE GREAT WHITE 4.2 (BLADE) IMPLANT
BOOTIES KNEE HIGH SLOAN (MISCELLANEOUS) ×2 IMPLANT
CANISTER SUCT LVC 12 LTR MEDI- (MISCELLANEOUS) ×2 IMPLANT
CANISTER SUCTION 1200CC (MISCELLANEOUS) IMPLANT
CANISTER SUCTION 2500CC (MISCELLANEOUS) IMPLANT
CANNULA ACUFLEX KIT 5X76 (CANNULA) IMPLANT
CLOTH BEACON ORANGE TIMEOUT ST (SAFETY) ×2 IMPLANT
CUTTER MENISCUS  4.2MM (BLADE)
CUTTER MENISCUS 4.2MM (BLADE) IMPLANT
DRAPE ARTHROSCOPY W/POUCH 114 (DRAPES) ×2 IMPLANT
DURAPREP 26ML APPLICATOR (WOUND CARE) ×2 IMPLANT
ELECT MENISCUS 165MM 90D (ELECTRODE) ×2 IMPLANT
ELECT REM PT RETURN 9FT ADLT (ELECTROSURGICAL)
ELECTRODE REM PT RTRN 9FT ADLT (ELECTROSURGICAL) IMPLANT
GAUZE SPONGE 4X4 12PLY STRL LF (GAUZE/BANDAGES/DRESSINGS) ×2 IMPLANT
GLOVE BIO SURGEON STRL SZ 6.5 (GLOVE) ×2 IMPLANT
GLOVE ECLIPSE 6.0 STRL STRAW (GLOVE) ×2 IMPLANT
GLOVE SURG SS PI 8.0 STRL IVOR (GLOVE) ×2 IMPLANT
GOWN PREVENTION PLUS LG XLONG (DISPOSABLE) ×2 IMPLANT
GOWN STRL REIN XL XLG (GOWN DISPOSABLE) ×2 IMPLANT
IV NS IRRIG 3000ML ARTHROMATIC (IV SOLUTION) ×4 IMPLANT
KNEE WRAP E Z 3 GEL PACK (MISCELLANEOUS) ×2 IMPLANT
MINI VAC (SURGICAL WAND) IMPLANT
NDL SAFETY ECLIPSE 18X1.5 (NEEDLE) ×2 IMPLANT
NEEDLE FILTER BLUNT 18X 1/2SAF (NEEDLE) ×1
NEEDLE FILTER BLUNT 18X1 1/2 (NEEDLE) ×1 IMPLANT
NEEDLE HYPO 18GX1.5 SHARP (NEEDLE) ×2
PACK ARTHROSCOPY DSU (CUSTOM PROCEDURE TRAY) ×2 IMPLANT
PACK BASIN DAY SURGERY FS (CUSTOM PROCEDURE TRAY) ×2 IMPLANT
PADDING CAST COTTON 6X4 STRL (CAST SUPPLIES) ×2 IMPLANT
PENCIL BUTTON HOLSTER BLD 10FT (ELECTRODE) ×2 IMPLANT
RESECTOR FULL RADIUS 4.2MM (BLADE) ×2 IMPLANT
SET ARTHROSCOPY TUBING (MISCELLANEOUS) ×1
SET ARTHROSCOPY TUBING LN (MISCELLANEOUS) ×1 IMPLANT
SPONGE GAUZE 4X4 12PLY (GAUZE/BANDAGES/DRESSINGS) ×2 IMPLANT
SUT ETHILON 4 0 PS 2 18 (SUTURE) ×2 IMPLANT
SYR 30ML LL (SYRINGE) ×2 IMPLANT
SYRINGE 10CC LL (SYRINGE) ×2 IMPLANT
TOWEL OR 17X24 6PK STRL BLUE (TOWEL DISPOSABLE) ×2 IMPLANT
WAND 90 DEG TURBOVAC W/CORD (SURGICAL WAND) IMPLANT
WATER STERILE IRR 500ML POUR (IV SOLUTION) ×2 IMPLANT

## 2012-10-29 NOTE — Anesthesia Postprocedure Evaluation (Signed)
Anesthesia Post Note  Patient: Traci Bird  Procedure(s) Performed: Procedure(s) (LRB): ARTHROSCOPY KNEE (Left) KNEE ARTHROSCOPY WITH LATERAL RELEASE (Left)  Anesthesia type: General  Patient location: PACU  Post pain: Pain level controlled  Post assessment: Post-op Vital signs reviewed  Last Vitals: BP 118/68  Pulse 74  Temp 36.4 C (Oral)  Resp 16  Ht 5' 1.5" (1.562 m)  Wt 183 lb (83.008 kg)  BMI 34.02 kg/m2  SpO2 100%  Post vital signs: Reviewed  Level of consciousness: sedated  Complications: No apparent anesthesia complications

## 2012-10-29 NOTE — Transfer of Care (Signed)
Immediate Anesthesia Transfer of Care Note  Patient: Traci Bird  Procedure(s) Performed: Procedure(s) (LRB): ARTHROSCOPY KNEE (Left) KNEE ARTHROSCOPY WITH LATERAL RELEASE (Left)  Patient Location: PACU  Anesthesia Type: General  Level of Consciousness: awake, sedated, patient cooperative and responds to stimulation  Airway & Oxygen Therapy: Patient Spontanous Breathing and Patient connected to face mask oxygen  Post-op Assessment: Report given to PACU RN, Post -op Vital signs reviewed and stable and Patient moving all extremities  Post vital signs: Reviewed and stable  Complications: No apparent anesthesia complications

## 2012-10-29 NOTE — Anesthesia Procedure Notes (Signed)
Procedure Name: LMA Insertion Date/Time: 10/29/2012 2:48 PM Performed by: Jessica Priest Pre-anesthesia Checklist: Patient identified, Emergency Drugs available, Suction available and Patient being monitored Patient Re-evaluated:Patient Re-evaluated prior to inductionOxygen Delivery Method: Circle System Utilized Preoxygenation: Pre-oxygenation with 100% oxygen Intubation Type: IV induction Ventilation: Mask ventilation without difficulty LMA: LMA inserted LMA Size: 4.0 Number of attempts: 1 Airway Equipment and Method: bite block Placement Confirmation: positive ETCO2 Tube secured with: Tape Dental Injury: Teeth and Oropharynx as per pre-operative assessment

## 2012-10-29 NOTE — Brief Op Note (Signed)
10/29/2012  3:33 PM  PATIENT:  Riki Sheer  51 y.o. 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  PROCEDURE:  Procedure(s) (LRB) with comments: ARTHROSCOPY KNEE (Left) - WITH DEBRIDEMENT KNEE ARTHROSCOPY WITH LATERAL RELEASE (Left)  SURGEON:  Surgeon(s) and Role:    * Javier Docker, MD - Primary  PHYSICIAN ASSISTANT:   ASSISTANTS: none   ANESTHESIA:   general  EBL:  Total I/O In: 1000 [I.V.:1000] Out: -   BLOOD ADMINISTERED:none  DRAINS: none   LOCAL MEDICATIONS USED:  MARCAINE     SPECIMEN:  No Specimen  DISPOSITION OF SPECIMEN:  N/A  COUNTS:  YES  TOURNIQUET:  * No tourniquets in log *  DICTATION: .Other Dictation: Dictation Number [id number]  PLAN OF CARE: Discharge to home after PACU  PATIENT DISPOSITION:  PACU - hemodynamically stable.   Delay start of Pharmacological VTE agent (>24hrs) due to surgical blood loss or risk of bleeding: no

## 2012-10-29 NOTE — Anesthesia Preprocedure Evaluation (Signed)
Anesthesia Evaluation  Patient identified by MRN, date of birth, ID band Patient awake    Reviewed: Allergy & Precautions, H&P , NPO status , Patient's Chart, lab work & pertinent test results  History of Anesthesia Complications (+) PONV  Airway Mallampati: II TM Distance: >3 FB Neck ROM: Full    Dental  (+) Teeth Intact and Dental Advisory Given   Pulmonary neg pulmonary ROS, former smoker,  Prior hx of osa, but none post gastric bypass. breath sounds clear to auscultation  Pulmonary exam normal       Cardiovascular - CAD and - Past MI Rhythm:Regular Rate:Normal     Neuro/Psych negative neurological ROS  negative psych ROS   GI/Hepatic negative GI ROS, Neg liver ROS,   Endo/Other  Hypothyroidism Prior hx of DMII, but none after gastric bypass  Renal/GU negative Renal ROS     Musculoskeletal negative musculoskeletal ROS (+)   Abdominal (+) + obese,   Peds  Hematology negative hematology ROS (+)   Anesthesia Other Findings   Reproductive/Obstetrics                           Anesthesia Physical Anesthesia Plan  ASA: II  Anesthesia Plan: General   Post-op Pain Management:    Induction: Intravenous  Airway Management Planned: LMA  Additional Equipment:   Intra-op Plan:   Post-operative Plan:   Informed Consent: I have reviewed the patients History and Physical, chart, labs and discussed the procedure including the risks, benefits and alternatives for the proposed anesthesia with the patient or authorized representative who has indicated his/her understanding and acceptance.   Dental advisory given  Plan Discussed with: CRNA  Anesthesia Plan Comments:         Anesthesia Quick Evaluation

## 2012-10-29 NOTE — H&P (Addendum)
Traci Bird is an 51 y.o. female.   Chief Complaint: Left knee pain HPI: DJD Meniscus tear left knee refractory  Past Medical History  Diagnosis Date  . Hypothyroidism   . Chondromalacia of left knee   . Acute meniscal tear of knee LEFT  . Arthritis HIPS , KNEES, HANDS  . History of diabetes mellitus NO ISSUE SINCE GASTRIC BYPASS  . History of sleep apnea NO ISSUES SINCE GASTRIC BYPASS    Past Surgical History  Procedure Date  . Gastric bypass 2008  . Tonsillectomy and adenoidectomy 1966  . Septoplasty 1987  . Bilateral breast reduction 1992  . Neuroplasty / transposition median nerve at carpal tunnel bilateral 2002  . Tubal ligation 1984  . Tendon repair heel 2011    left  . Partial thyoidectomy 1994  . Abdominal hysterectomy 11-06-2005  DR HENLEY    W/ BILATERAL SALPINGO-OOPHORECTOMY  . Hysteroscopy w/d&c 05-16-2005  DR HENLEY    POLYPECTOMY    Family History  Problem Relation Age of Onset  . Colon cancer Neg Hx   . Stomach cancer Neg Hx    Social History:  reports that she has never smoked. She has never used smokeless tobacco. She reports that she does not drink alcohol or use illicit drugs.  Allergies:  Allergies  Allergen Reactions  . Morphine And Related Itching    Medications Prior to Admission  Medication Sig Dispense Refill  . Cholecalciferol (VITAMIN D3 PO) Take 1 capsule by mouth daily.      . Cyanocobalamin (B-12 PO) Take 1 tablet by mouth daily.      Marland Kitchen HYDROcodone-acetaminophen (VICODIN) 5-500 MG per tablet Take 1 tablet by mouth every 4 (four) hours as needed.       Marland Kitchen levothyroxine (SYNTHROID) 88 MCG tablet Take 88 mcg by mouth every morning.       . traMADol (ULTRAM) 50 MG tablet Take 50 mg by mouth every 6 (six) hours as needed.       . zolpidem (AMBIEN) 5 MG tablet Take 5 mg by mouth at bedtime as needed.        Results for orders placed during the hospital encounter of 10/29/12 (from the past 48 hour(s))  POCT HEMOGLOBIN-HEMACUE      Status: Normal   Collection Time   10/29/12 12:35 PM      Component Value Range Comment   Hemoglobin 13.4  12.0 - 15.0 g/dL    No results found.  Review of Systems  Musculoskeletal: Positive for joint pain.  All other systems reviewed and are negative.    Blood pressure 113/81, pulse 77, temperature 97.6 F (36.4 C), temperature source Oral, resp. rate 18, height 5' 1.5" (1.562 m), weight 83.008 kg (183 lb), SpO2 100.00%. Physical Exam  Vitals reviewed. Constitutional: She appears well-developed.  HENT:  Head: Normocephalic.  Eyes: Pupils are equal, round, and reactive to light.  Neck: Normal range of motion.  Cardiovascular: Normal rate.   Respiratory: Effort normal.  GI: Soft.  Musculoskeletal:       MJLT. +effusion.+Mcmurray. No DVT  Neurological: She is alert.  Skin: Skin is warm.  Psychiatric: She has a normal mood and affect.   MRI DJD PF and medial compartment. Chondromalacia  Assessment/Plan Left knee Medial meniscus tear, Chondromalacia patellofemoral joint, medial compartment refractory. Plan arthroscopy chondroplasty partial menisectomy. Risks discussed. Possible lateral release Zakirah Weingart C 10/29/2012, 1:51 PM

## 2012-11-01 ENCOUNTER — Encounter (HOSPITAL_BASED_OUTPATIENT_CLINIC_OR_DEPARTMENT_OTHER): Payer: Self-pay | Admitting: Specialist

## 2012-11-01 NOTE — Op Note (Signed)
NAMEBRAZIL, VOYTKO              ACCOUNT NO.:  [id number]  MEDICAL RECORD NO.:  [id number]  LOCATION:                                 FACILITY:  PHYSICIAN:  Jene Every, M.D.         DATE OF BIRTH:  DATE OF PROCEDURE:  10/29/2012 DATE OF DISCHARGE:                              OPERATIVE REPORT   PREOPERATIVE DIAGNOSIS:  Patellofemoral chondromalacia lateral tracking patella, meniscus tear, medial.  POSTOPERATIVE DIAGNOSIS:  Patellofemoral chondromalacia lateral tracking patella, meniscus tear, medial.  PROCEDURE PERFORMED: 1. Left knee arthroscopy. 2. Chondroplasty patellofemoral joint. 3. Partial medial meniscectomy. 4. Lateral retinacular release.  ANESTHESIA:  General.  ASSISTANT:  No assistant.  HISTORY:  This is a pleasant 51 year old female who is having severe patellofemoral pain, lateral tracking patella, patellofemoral pain with compression, medial lateral joint line tenderness, fusion, refractory to conservative treatment to include rest, activity modification, corticosteroid injection, and McMurray.  Is indicated for arthroscopic debridement, chondroplasty, meniscal shaving, possible lateral retinacular release.  Risk and benefits discussed including bleeding, infection, damage to vascular, DVT, PE, anesthetic complications, need for total knee in the future, etc.  TECHNIQUE:  With the patient in a supine position, after induction of adequate general anesthesia, 2 g of Kefzol, left lower extremity was prepped and draped in usual sterile fashion.  A lateral parapatellar portal was fashioned with a #11 blade after localization with 18-gauge needle sparing the medial meniscus.  Under direct visualization, a medial parapatellar portal was fashioned with a #11 blade after localization with 18-gauge needle sparing the medial meniscus.  Noted was an anterior horn tear and the medial meniscus shaver was introduced utilizing a shaver to a stable base.  The remnant  meniscus was stable to probe palpation.  Minor grade 2 changes in the medial compartment until we entered near the sulcus.  There was some grade 4 changes.  Some attenuation of the ACL however was intact.  Lateral compartment revealed minor grade 2 changes of the femoral condyle and tibial plateau. Meniscus stable to probe palpation without evidence of tear.  However, midportion of the femoral condyle there was eburnated bone in the sulcus and the patella, some grade 3 changes of patella.  Light chondroplasty performed here.  There was some lateral tracking of the patella.  Tight lateral band.  Used an arthroscopic electrocautery and cautery, released the lateral retinaculum.  Serving skin just though the lateral retinaculum only.  This is from the upper border of the patella to the lower border of the patella approximately 1 cm from its insertion into the patella.  We preserved the neurovascular supply to the patella. Following the release, I was able to slightly evert the patella and felt there was definite increase in space in the patellofemoral joint laterally.  We reduced the arthroscopic pressure, which was utilized as 75 downward to the below 20, there was no active bleeding noted, so lateral release was performed from both portals.  Copiously lavaged knee, reexamined all compartments.  No further pathology amenable to arthroscopic intervention.  Therefore, removed all instrumentation. Portals were closed with 4-0 nylon simple sutures.  0.25% Marcaine with epinephrine was infiltrated in the joint.  Wound was  dressed sterilely. Awoken without difficulty and transported to recovery in satisfactory condition.  The patient tolerated the procedure well.  No complications.  No assistant.  Minimal blood loss.     Jene Every, M.D.     Cordelia Pen  D:  10/29/2012  T:  10/30/2012  Job:  [id number]

## 2013-08-31 ENCOUNTER — Other Ambulatory Visit: Payer: Self-pay | Admitting: Family Medicine

## 2013-08-31 DIAGNOSIS — Z1231 Encounter for screening mammogram for malignant neoplasm of breast: Secondary | ICD-10-CM

## 2013-09-12 ENCOUNTER — Ambulatory Visit: Payer: 59

## 2013-09-22 ENCOUNTER — Other Ambulatory Visit: Payer: Self-pay | Admitting: Specialist

## 2013-09-22 DIAGNOSIS — M5126 Other intervertebral disc displacement, lumbar region: Secondary | ICD-10-CM

## 2013-09-26 ENCOUNTER — Other Ambulatory Visit: Payer: 59

## 2013-09-30 ENCOUNTER — Ambulatory Visit
Admission: RE | Admit: 2013-09-30 | Discharge: 2013-09-30 | Disposition: A | Discharge status: Home / Self Care | Payer: 59 | Source: Ambulatory Visit | Attending: Specialist | Admitting: Specialist

## 2013-09-30 VITALS — BP 91/55 | HR 72

## 2013-09-30 DIAGNOSIS — M5126 Other intervertebral disc displacement, lumbar region: Secondary | ICD-10-CM

## 2013-09-30 MED ORDER — ONDANSETRON HCL 4 MG/2ML IJ SOLN
4.0000 mg | Freq: Once | INTRAMUSCULAR | Status: AC
  Administered 2013-09-30: 4 mg via INTRAMUSCULAR

## 2013-09-30 MED ORDER — IOHEXOL 180 MG/ML  SOLN
17.0000 mL | Freq: Once | INTRAMUSCULAR | Status: AC | PRN
  Administered 2013-09-30: 17 mL via INTRATHECAL

## 2013-09-30 MED ORDER — MEPERIDINE HCL 100 MG/ML IJ SOLN
75.0000 mg | Freq: Once | INTRAMUSCULAR | Status: AC
  Administered 2013-09-30: 75 mg via INTRAMUSCULAR

## 2013-09-30 MED ORDER — DIAZEPAM 5 MG PO TABS
10.0000 mg | ORAL_TABLET | Freq: Once | ORAL | Status: AC
  Administered 2013-09-30: 10 mg via ORAL

## 2013-09-30 NOTE — Progress Notes (Signed)
Pt has been off the citalopram and tramadol for the past 2 days. Discharge instruction explained to pt and her husband.

## 2014-07-24 ENCOUNTER — Other Ambulatory Visit: Payer: Self-pay | Admitting: Family Medicine

## 2014-07-24 DIAGNOSIS — Z1231 Encounter for screening mammogram for malignant neoplasm of breast: Secondary | ICD-10-CM

## 2014-07-27 ENCOUNTER — Ambulatory Visit: Payer: 59

## 2014-11-13 ENCOUNTER — Other Ambulatory Visit: Payer: Self-pay | Admitting: Physician Assistant

## 2015-02-22 ENCOUNTER — Ambulatory Visit: Payer: Self-pay | Admitting: Orthopedic Surgery

## 2015-03-13 ENCOUNTER — Ambulatory Visit: Payer: Self-pay | Admitting: Orthopedic Surgery

## 2015-03-13 NOTE — Patient Instructions (Addendum)
Traci Bird  03/13/2015   Your procedure is scheduled on:  03/22/2015    Report to [facility] Main  Entrance and follow signs to               [place] at      [place].  Call this number if you have problems the morning of surgery [phone]   Remember:  Do not eat food or drink liquids :After Midnight.     Take these medicines the morning of surgery with A SIP OF WATER: Buspar, Zyrtec, Synthroid                                 You may not have any metal on your body including hair pins and              piercings  Do not wear jewelry, make-up, lotions, powders or perfumes., deodorant.               Do not wear nail polish.  Do not shave  48 hours prior to surgery.     Do not bring valuables to the hospital. [place].  Contacts, dentures or bridgework may not be worn into surgery.  Leave suitcase in the car. After surgery it may be brought to your room.       Special Instructions: coughing and deep breathing exercises, leg exercises               Please read over the following fact sheets you were given: _____________________________________________________________________             [facility] - Preparing for Surgery Before surgery, you can play an important role.  Because skin is not sterile, your skin needs to be as free of germs as possible.  You can reduce the number of germs on your skin by washing with CHG (chlorahexidine gluconate) soap before surgery.  CHG is an antiseptic cleaner which kills germs and bonds with the skin to continue killing germs even after washing. Please DO NOT use if you have an allergy to CHG or antibacterial soaps.  If your skin becomes reddened/irritated stop using the CHG and inform your nurse when you arrive at Short Stay. Do not shave (including legs and underarms) for at least 48 hours prior to the first CHG shower.  You may shave your face/neck. Please  follow these instructions carefully:  1.  Shower with CHG Soap the night before surgery and the  morning of Surgery.  2.  If you choose to wash your hair, wash your hair first as usual with your  normal  shampoo.  3.  After you shampoo, rinse your hair and body thoroughly to remove the  shampoo.                           4.  Use CHG as you would any other liquid soap.  You can apply chg directly  to the skin and wash                       Gently with a scrungie or clean washcloth.  5.  Apply the CHG Soap to your body  ONLY FROM THE NECK DOWN.   Do not use on face/ open                           Wound or open sores. Avoid contact with eyes, ears mouth and genitals (private parts).                       Wash face,  Genitals (private parts) with your normal soap.             6.  Wash thoroughly, paying special attention to the area where your surgery  will be performed.  7.  Thoroughly rinse your body with warm water from the neck down.  8.  DO NOT shower/wash with your normal soap after using and rinsing off  the CHG Soap.                9.  Pat yourself dry with a clean towel.            10.  Wear clean pajamas.            11.  Place clean sheets on your bed the night of your first shower and do not  sleep with pets. Day of Surgery : Do not apply any lotions/deodorants the morning of surgery.  Please wear clean clothes to the hospital/surgery center.  FAILURE TO FOLLOW THESE INSTRUCTIONS MAY RESULT IN THE CANCELLATION OF YOUR SURGERY PATIENT SIGNATURE_________________________________  NURSE SIGNATURE__________________________________  ________________________________________________________________________  WHAT IS A BLOOD TRANSFUSION? Blood Transfusion Information  A transfusion is the replacement of blood or some of its parts. Blood is made up of multiple cells which provide different functions.  Red blood cells carry oxygen and are used for blood loss replacement.  White blood cells  fight against infection.  Platelets control bleeding.  Plasma helps clot blood.  Other blood products are available for specialized needs, such as hemophilia or other clotting disorders. BEFORE THE TRANSFUSION  Who gives blood for transfusions?   Healthy volunteers who are fully evaluated to make sure their blood is safe. This is blood bank blood. Transfusion therapy is the safest it has ever been in the practice of medicine. Before blood is taken from a donor, a complete history is taken to make sure that person has no history of diseases nor engages in risky social behavior (examples are intravenous drug use or sexual activity with multiple partners). The donor's travel history is screened to minimize risk of transmitting infections, such as malaria. The donated blood is tested for signs of infectious diseases, such as HIV and hepatitis. The blood is then tested to be sure it is compatible with you in order to minimize the chance of a transfusion reaction. If you or a relative donates blood, this is often done in anticipation of surgery and is not appropriate for emergency situations. It takes many days to process the donated blood. RISKS AND COMPLICATIONS Although transfusion therapy is very safe and saves many lives, the main dangers of transfusion include:  1. Getting an infectious disease. 2. Developing a transfusion reaction. This is an allergic reaction to something in the blood you were given. Every precaution is taken to prevent this. The decision to have a blood transfusion has been considered carefully by your caregiver before blood is given. Blood is not given unless the benefits outweigh the risks. AFTER THE TRANSFUSION  Right after receiving a blood transfusion, you will usually feel much  better and more energetic. This is especially true if your red blood cells have gotten low (anemic). The transfusion raises the level of the red blood cells which carry oxygen, and this usually  causes an energy increase.  The nurse administering the transfusion will monitor you carefully for complications. HOME CARE INSTRUCTIONS  No special instructions are needed after a transfusion. You may find your energy is better. Speak with your caregiver about any limitations on activity for underlying diseases you may have. SEEK MEDICAL CARE IF:   Your condition is not improving after your transfusion.  You develop redness or irritation at the intravenous (IV) site. SEEK IMMEDIATE MEDICAL CARE IF:  Any of the following symptoms occur over the next 12 hours:  Shaking chills.  You have a temperature by mouth above 102 F (38.9 C), not controlled by medicine.  Chest, back, or muscle pain.  People around you feel you are not acting correctly or are confused.  Shortness of breath or difficulty breathing.  Dizziness and fainting.  You get a rash or develop hives.  You have a decrease in urine output.  Your urine turns a dark color or changes to pink, red, or brown. Any of the following symptoms occur over the next 10 days:  You have a temperature by mouth above 102 F (38.9 C), not controlled by medicine.  Shortness of breath.  Weakness after normal activity.  The white part of the eye turns yellow (jaundice).  You have a decrease in the amount of urine or are urinating less often.  Your urine turns a dark color or changes to pink, red, or brown. Document Released: 11/21/2000 Document Revised: 02/16/2012 Document Reviewed: 07/10/2008 ExitCare Patient Information 2014 [place].  _______________________________________________________________________  Incentive Spirometer  An incentive spirometer is a tool that can help keep your lungs clear and active. This tool measures how well you are filling your lungs with each breath. Taking long deep breaths may help reverse or decrease the chance of developing breathing (pulmonary) problems (especially infection)  following:  A long period of time when you are unable to move or be active. BEFORE THE PROCEDURE   If the spirometer includes an indicator to show your best effort, your nurse or respiratory therapist will set it to a desired goal.  If possible, sit up straight or lean slightly forward. Try not to slouch.  Hold the incentive spirometer in an upright position. INSTRUCTIONS FOR USE  3. Sit on the edge of your bed if possible, or sit up as far as you can in bed or on a chair. 4. Hold the incentive spirometer in an upright position. 5. Breathe out normally. 6. Place the mouthpiece in your mouth and seal your lips tightly around it. 7. Breathe in slowly and as deeply as possible, raising the piston or the ball toward the top of the column. 8. Hold your breath for 3-5 seconds or for as long as possible. Allow the piston or ball to fall to the bottom of the column. 9. Remove the mouthpiece from your mouth and breathe out normally. 10. Rest for a few seconds and repeat Steps 1 through 7 at least 10 times every 1-2 hours when you are awake. Take your time and take a few normal breaths between deep breaths. 11. The spirometer may include an indicator to show your best effort. Use the indicator as a goal to work toward during each repetition. 12. After each set of 10 deep breaths, practice coughing to be sure  your lungs are clear. If you have an incision (the cut made at the time of surgery), support your incision when coughing by placing a pillow or rolled up towels firmly against it. Once you are able to get out of bed, walk around indoors and cough well. You may stop using the incentive spirometer when instructed by your caregiver.  RISKS AND COMPLICATIONS  Take your time so you do not get dizzy or light-headed.  If you are in pain, you may need to take or ask for pain medication before doing incentive spirometry. It is harder to take a deep breath if you are having pain. AFTER USE  Rest and  breathe slowly and easily.  It can be helpful to keep track of a log of your progress. Your caregiver can provide you with a simple table to help with this. If you are using the spirometer at home, follow these instructions: Reader IF:   You are having difficultly using the spirometer.  You have trouble using the spirometer as often as instructed.  Your pain medication is not giving enough relief while using the spirometer.  You develop fever of 100.5 F (38.1 C) or higher. SEEK IMMEDIATE MEDICAL CARE IF:   You cough up bloody sputum that had not been present before.  You develop fever of 102 F (38.9 C) or greater.  You develop worsening pain at or near the incision site. MAKE SURE YOU:   Understand these instructions.  Will watch your condition.  Will get help right away if you are not doing well or get worse. Document Released: 04/06/2007 Document Revised: 02/16/2012 Document Reviewed: 06/07/2007 [facility] Patient Information 2014 [place], [place].   ________________________________________________________________________

## 2015-03-13 NOTE — H&P (Signed)
Traci Bird DOB: July 03, 1961 Married / Language: English / Race: White Female  H&P Date: 03/13/15  Chief complaint: Left knee pain  History of Present Illness  The patient is a 54 year old female who comes in today for a preoperative History and Physical. The patient is scheduled for a left total knee arthroplasty to be performed by Dr. Johnn Hai, MD at [facility] on 03/22/15. She is s/p L knee scope 3 months ago, chondroplasty PF jt, lateral release, grade 3 changes medial/lateral tibial plateau and grade 4 PF. She is still having a difficult time with steps, having to go one at a time leading with her other leg, up and down the stairs. She is no longer having any catching in the knee when she turns over at night, but still having pain with that. She still feels like the knee is giving way on her when she is walking for extended periods of time. She's noting increased pain with flexing the knee up recently and increased nighttime pain. She has done viscosupplementation injections and steroid injections in the past with only short term relief. She's in pain mgmt with Dr. Nelva Bush and on Norco long-term. Her pain is interfering with ADL's at this point.  Dr. Tonita Cong and the patient mutually agreed to proceed with a total knee replacement. Risks and benefits of the procedure were discussed including stiffness, suboptimal range of motion, persistent pain, infection requiring removal of prosthesis and reinsertion, need for prophylactic antibiotics in the future, for example, dental procedures, possible need for manipulation, revision in the future and also anesthetic complications including DVT, PE, etc. We discussed the perioperative course, time in the hospital, postoperative recovery and the need for elevation to control swelling. We also discussed the predicted range of motion and the probability that squatting and kneeling would be unobtainable in the future. In addition, postoperative  anticoagulation was discussed. We have obtained preoperative medical clearance as necessary from her PCP. Provided her illustrated handout and discussed it in detail. They will enroll in the total joint replacement educational forum at the hospital. Her [facility] pre-op appt is scheduled for tomorrow. She reports she does have the option to work from home and to work part-time.  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  Family History Hypertension First Degree Relatives. mother and sister Diabetes Mellitus mother and father Heart Disease father and grandmother mothers side Rheumatoid Arthritis mother, sister and grandmother mothers side Cerebrovascular Accident grandfather mothers side First Degree Relatives  Social History Tobacco use Never smoker. never smoker Living situation Lives with spouse. 2 steps to enter, one level home Post-Surgical Plans home with HHPT Alcohol use never consumed alcohol Drug/Alcohol Rehab (Previously) no Pain Contract no Illicit drug use no Current work status working full time Marital status married Number of flights of stairs before winded 2-3 Tobacco / smoke exposure no Children 2 Drug/Alcohol Rehab (Currently) no Exercise Exercises weekly; does running / walking  Medication History BusPIRone HCl (10MG  Tablet, Oral) Active. Cetirizine HCl (10MG  Tablet, Oral) Active. Levothyroxine Sodium (75MCG Tablet, Oral) Active. Citalopram Hydrobromide (40MG  Tablet, Oral) Active. Norco (5-325MG  Tablet,  1 (one) Oral every six to eight hours as needed,  Taken starting 08/17/2013) Inactive. (JCB/JMB/SSJ [place] @ [phone]) Atorvastatin Calcium (10MG  Tablet, Oral) Active. Zolpidem Tartrate (10MG  Tablet, Oral) Active. (prn) TraMADol HCl (50MG  Tablet, Oral) Active. (prn during the day) Promethazine HCl (25MG  Tablet, Oral) Active. Methocarbamol (500MG  Tablet, Oral) Active. Medications Reconciled  Past Surgical History Breast Reconstruction bilateral Carpal Tunnel Repair bilateral Mammoplasty; Reduction bilateral Hysterectomy complete (non-cancerous) Colon Polyp Removal - Colonoscopy Thyroidectomy; Subtotal right Ankle Surgery left Straighten Nasal Septum Gastric Bypass Arthroscopic Knee Surgery - Left x2, most recent 12/2014 Dr. Tonita Cong Pt does have hx of Post-op Nausea and Vomiting, did well after her most recent knee scope  Review of Systems General Not Present- Chills, Fatigue, Fever, Memory Loss, Night Sweats, Weight Gain and Weight Loss. Skin Not Present- Eczema, Hives, Itching, Lesions and Rash. HEENT Not Present- Dentures, Double Vision, Headache, Hearing Loss, Tinnitus and Visual Loss. Respiratory Not Present- Allergies, Chronic Cough, Coughing up blood, Shortness of breath at rest and Shortness of breath with exertion. Cardiovascular Not Present- Chest Pain, Difficulty Breathing Lying Down, Murmur, Palpitations, Racing/skipping heartbeats and Swelling. Gastrointestinal Not Present- Abdominal Pain, Bloody Stool, Constipation, Diarrhea, Difficulty Swallowing, Heartburn, Jaundice, Loss of appetitie, Nausea and Vomiting. Female Genitourinary Not Present- Blood in Urine, Discharge, Flank Pain, Incontinence, Painful Urination, Urgency, Urinary frequency, Urinary Retention, Urinating at Night and Weak urinary stream. Musculoskeletal Present- Back Pain, Joint Pain, Joint Stiffness, Joint Swelling, Morning Stiffness and Muscle Pain. Not Present- Muscle Weakness and  Spasms. Neurological Not Present- Blackout spells, Difficulty with balance, Dizziness, Paralysis, Tremor and Weakness. Psychiatric Not Present- Insomnia.  Physical Exam General Mental Status -Alert, cooperative and good historian. General Appearance-pleasant, Not in acute distress. Orientation-Oriented X3. Build & Nutrition-Well nourished and Well developed. Gait-Stiff and Antalgic.  Head and Neck Head-normocephalic, atraumatic . Neck Global Assessment - supple, no bruit auscultated on the right, no bruit auscultated on the left.  Eye Pupil - Bilateral-Regular and Round. Motion - Bilateral-EOMI.  Chest and Lung Exam Auscultation Breath sounds - clear at anterior chest wall and clear at posterior chest wall. Adventitious sounds - No Adventitious sounds.  Cardiovascular Auscultation Rhythm - Regular rate and rhythm. Heart Sounds - S1 WNL and S2 WNL. Murmurs & Other Heart Sounds - Auscultation of the heart reveals - No Murmurs.  Abdomen Palpation/Percussion Tenderness - Abdomen is non-tender to palpation. Rigidity (guarding) - Abdomen is soft. Auscultation Auscultation of the abdomen reveals - Bowel sounds normal.  Female Genitourinary Not done, not pertinent to present illness  Musculoskeletal Left Knee: Inspection and Palpation - Tenderness - medial joint line tender to palpation and lateral joint line tender to palpation, no tenderness to palpation of the superior calf, no tenderness to palpation of the quadriceps tendon, no tenderness to palpation of the patellar tendon, no tenderness to palpation of the fibular head, no tenderness to palpation of the peroneal nerve. Crepitus - mild patellofemoral crepitus. Patellar Tendon - no pain to palpation of the patellar tendon. Swelling - localized and mild. Tissue tension/texture is - soft. Pulses - 2+. Sensation - intact to light touch. Skin - Color - no ecchymosis, no erythema. Wound/Incision - Appearance - The  incision is well healed with no erythema or exudates. Swelling - there is no swelling around the incision. Healing - The incision is well healed and healing as expected. Strength and Tone - Quadriceps - 5/5. Hamstrings - 5/5. ROM: Flexion - AROM - 110 . Extension - AROM - 0 . Stability - Valgus Laxity at 30 - None. Valgus Laxity at 0 - None. Varus  Laxity at 30 - None. Varus Laxity at 0 - None. Lachman - Negative. Anterior Drawer Test - Negative. Posterior Drawer Test - Negative. Left Knee - Deformities/Malalignments/Discrepancies - no deformities noted. Special Tests - McMurray Test (lateral) - negative. McMurray Test (medial) - negative. Patellar Compression Pain - mild pain.  Imaging Standing xrays reviewed. Moderate medial and lateral narrowing noted, no significant collapse when compared with prior films from 06/2014. Prior lateral films show significant PF narrowing. Grade 4 changes noted arthroscopically 3 months ago.  Assessment & Plan Primary osteoarthritis of left knee (M17.12)  Pt with end-stage Left knee DJD, bone-on-bone, refractory to conservative tx, scheduled for Left total knee replacement by Dr. Tonita Cong on 03/22/15. We again discussed the procedure itself as well as risks, complications and alternatives, including but not limited to DVT, PE, infx, bleeding, failure of procedure, need for secondary procedure including manipulation, nerve injury, ongoing pain/symptoms, anesthesia risk, even stroke or death. Also discussed typical post-op protocols, activity restrictions, need for PT, flexion/extension exercises, time out of work. Discussed need for DVT ppx post-op with Xarelto then ASA per protocol. Discussed dental ppx. Also discussed limitations post-operatively such as kneeling and squatting. All questions were answered. Patient desires to proceed with surgery as scheduled. Will hold supplements accordingly, discussed her other medications as well. Will remain NPO after MN night before  surgery. Will present to [facility] for pre-op testing. Plan Xarelto 2 weeks post-op for DVT ppx then ASA. With her morphine allergy, will use dilaudid initially post-op. For D/C, plan Percocet for acute post-operative pain, Robaxin, Colace, may need Rx for Phenergan as well, she does occasionally take that now for nausea, she will eventually return to Dr. Nelva Bush for ongoing pain mgmt. Plan home with HHPT post-op with family members at home for assistance. Discussed returning to work from home, part time, with ability to ice and elevate, when ready, depending on her progress and amount of pain. Will follow up 10-14 days post-op for suture removal and xrays.  Plan left total knee replacement  Signed electronically by Lacie Draft, PA-C for Dr. Tonita Cong

## 2015-03-14 ENCOUNTER — Encounter (HOSPITAL_COMMUNITY): Payer: Self-pay

## 2015-03-14 ENCOUNTER — Ambulatory Visit: Payer: Self-pay | Admitting: Orthopedic Surgery

## 2015-03-14 ENCOUNTER — Ambulatory Visit (HOSPITAL_COMMUNITY)
Admission: RE | Admit: 2015-03-14 | Discharge: 2015-03-14 | Disposition: A | Discharge status: Home / Self Care | Payer: 59 | Source: Ambulatory Visit | Attending: Orthopedic Surgery | Admitting: Orthopedic Surgery

## 2015-03-14 ENCOUNTER — Encounter (HOSPITAL_COMMUNITY)
Admission: RE | Admit: 2015-03-14 | Discharge: 2015-03-14 | Disposition: A | Discharge status: Home / Self Care | Payer: 59 | Source: Ambulatory Visit | Attending: Specialist | Admitting: Specialist

## 2015-03-14 DIAGNOSIS — M1712 Unilateral primary osteoarthritis, left knee: Secondary | ICD-10-CM

## 2015-03-14 DIAGNOSIS — Z01818 Encounter for other preprocedural examination: Secondary | ICD-10-CM | POA: Diagnosis not present

## 2015-03-14 HISTORY — DX: Major depressive disorder, single episode, unspecified: F32.9

## 2015-03-14 HISTORY — DX: Depression, unspecified: F32.A

## 2015-03-14 LAB — URINALYSIS, ROUTINE W REFLEX MICROSCOPIC
Glucose, UA: NEGATIVE mg/dL
HGB URINE DIPSTICK: NEGATIVE
Ketones, ur: NEGATIVE mg/dL
Leukocytes, UA: NEGATIVE
Nitrite: NEGATIVE
PROTEIN: NEGATIVE mg/dL
Specific Gravity, Urine: 1.034 — ABNORMAL HIGH (ref 1.005–1.030)
UROBILINOGEN UA: 1 mg/dL (ref 0.0–1.0)
pH: 6 (ref 5.0–8.0)

## 2015-03-14 LAB — BASIC METABOLIC PANEL
Anion gap: 7 (ref 5–15)
BUN: 16 mg/dL (ref 6–23)
CHLORIDE: 107 mmol/L (ref 96–112)
CO2: 28 mmol/L (ref 19–32)
Calcium: 8.9 mg/dL (ref 8.4–10.5)
Creatinine, Ser: 0.59 mg/dL (ref 0.50–1.10)
GFR calc Af Amer: >90 mL/min (ref >90)
GFR calc non Af Amer: >90 mL/min (ref >90)
Glucose, Bld: 89 mg/dL (ref 70–99)
Potassium: 4 mmol/L (ref 3.5–5.1)
Sodium: 142 mmol/L (ref 135–145)

## 2015-03-14 LAB — CBC
HCT: 40.1 % (ref 36.0–46.0)
HEMOGLOBIN: 12.6 g/dL (ref 12.0–15.0)
MCH: 27.5 pg (ref 26.0–34.0)
MCHC: 31.4 g/dL (ref 30.0–36.0)
MCV: 87.4 fL (ref 78.0–100.0)
Platelets: 193 10*3/uL (ref 150–400)
RBC: 4.59 MIL/uL (ref 3.87–5.11)
RDW: 14 % (ref 11.5–15.5)
WBC: 4.9 10*3/uL (ref 4.0–10.5)

## 2015-03-14 LAB — SURGICAL PCR SCREEN
MRSA, PCR: POSITIVE — AB
Staphylococcus aureus: POSITIVE — AB

## 2015-03-14 LAB — PROTIME-INR
INR: 1 (ref 0.00–1.49)
PROTHROMBIN TIME: 13.3 s (ref 11.6–15.2)

## 2015-03-14 LAB — APTT: aPTT: 28 seconds (ref 24–37)

## 2015-03-14 NOTE — Progress Notes (Signed)
Placed LOV note on chart from PCP dated 01/17/2015 from [place] in [place] which states that diabetes mellitus is resolved.

## 2015-03-14 NOTE — Progress Notes (Signed)
Called [facility] in [place] to verify dose of Synthroid.  Dose per pharmacy staff is 75 mcg.  Corrected dose in EPIc.  Also called and requested LOV note from PCP of Tanna Furry, PAC .

## 2015-03-15 NOTE — Progress Notes (Signed)
Left second message on home phone of [phone] and cell phone of [phone] regarding pcr swab results.  Instructed patient that [facility] had [facility] and to please call RN and let them be aware she received. Message.

## 2015-03-16 NOTE — Progress Notes (Signed)
Left messages again on cell- [phone], home- [phone] and cell phone of [phone] to inform patient that positive results for both MRSA and Staph on pcr screen and Mupirocin ointment script [facility].  Asked patient to call nurse back to make sure she had received message and picked up Mupirocin ointment.

## 2015-03-20 NOTE — Progress Notes (Signed)
Called [facility] [place] [place] stated Mupirocin ointment is on hold- patient has never picked up prescription.  Called [phone] and [phone] and left messages again for patient to call nurse back to state she has picked up Mupirocin ointment.  Informed patient that I had called [facility] on the message and they stated she has never picked up the script for Mupirocin.  Also attempted to call house number at [phone] and was unable to leave a message today.  Left message for Orson Slick regarding above statements that patient is not calling me back to states she has received message nor has she picked up the Mupirocin ointment at [facility] [place].  I verified phone numbers at time of preop appointment.

## 2015-03-21 ENCOUNTER — Ambulatory Visit: Payer: Self-pay | Admitting: Orthopedic Surgery

## 2015-03-21 NOTE — Anesthesia Preprocedure Evaluation (Addendum)
Anesthesia Evaluation  Patient identified by MRN, date of birth, ID band Patient awake    Reviewed: Allergy & Precautions, NPO status   History of Anesthesia Complications Negative for: history of anesthetic complications  Airway Mallampati: I  TM Distance: >3 FB Neck ROM: Full    Dental  (+) Teeth Intact   Pulmonary  breath sounds clear to auscultation        Cardiovascular Rhythm:Regular Rate:Normal     Neuro/Psych    GI/Hepatic Hx of gastric bypass   Endo/Other  Hypothyroidism   Renal/GU      Musculoskeletal  (+) Arthritis -,   Abdominal   Peds  Hematology   Anesthesia Other Findings   Reproductive/Obstetrics                            Anesthesia Physical Anesthesia Plan  ASA: II  Anesthesia Plan: General   Post-op Pain Management:    Induction: Intravenous  Airway Management Planned: Oral ETT  Additional Equipment:   Intra-op Plan:   Post-operative Plan: Extubation in OR  Informed Consent: I have reviewed the patients History and Physical, chart, labs and discussed the procedure including the risks, benefits and alternatives for the proposed anesthesia with the patient or authorized representative who has indicated his/her understanding and acceptance.   Dental advisory given  Plan Discussed with: CRNA and Surgeon  Anesthesia Plan Comments:         Anesthesia Quick Evaluation

## 2015-03-21 NOTE — Progress Notes (Signed)
Patient called back and left message stating she had been using Mupirocin ointment since last week.

## 2015-03-22 ENCOUNTER — Encounter (HOSPITAL_COMMUNITY)
Admission: RE | Disposition: A | Discharge status: Home / Self Care | Payer: Self-pay | Source: Ambulatory Visit | Attending: Specialist

## 2015-03-22 ENCOUNTER — Inpatient Hospital Stay (HOSPITAL_COMMUNITY): Payer: 59 | Admitting: Anesthesiology

## 2015-03-22 ENCOUNTER — Inpatient Hospital Stay (HOSPITAL_COMMUNITY): Payer: 59

## 2015-03-22 ENCOUNTER — Inpatient Hospital Stay (HOSPITAL_COMMUNITY)
Admission: RE | Admit: 2015-03-22 | Discharge: 2015-03-25 | DRG: 470 | Disposition: A | Discharge status: Home / Self Care | Payer: 59 | Source: Ambulatory Visit | Attending: Specialist | Admitting: Specialist

## 2015-03-22 ENCOUNTER — Encounter (HOSPITAL_COMMUNITY): Payer: Self-pay | Admitting: *Deleted

## 2015-03-22 DIAGNOSIS — Z96652 Presence of left artificial knee joint: Secondary | ICD-10-CM

## 2015-03-22 DIAGNOSIS — E780 Pure hypercholesterolemia: Secondary | ICD-10-CM | POA: Diagnosis present

## 2015-03-22 DIAGNOSIS — F329 Major depressive disorder, single episode, unspecified: Secondary | ICD-10-CM | POA: Diagnosis present

## 2015-03-22 DIAGNOSIS — M25562 Pain in left knee: Secondary | ICD-10-CM | POA: Diagnosis present

## 2015-03-22 DIAGNOSIS — G894 Chronic pain syndrome: Secondary | ICD-10-CM | POA: Diagnosis present

## 2015-03-22 DIAGNOSIS — M1712 Unilateral primary osteoarthritis, left knee: Principal | ICD-10-CM | POA: Diagnosis present

## 2015-03-22 DIAGNOSIS — Z885 Allergy status to narcotic agent status: Secondary | ICD-10-CM | POA: Diagnosis not present

## 2015-03-22 DIAGNOSIS — Z9884 Bariatric surgery status: Secondary | ICD-10-CM

## 2015-03-22 DIAGNOSIS — F419 Anxiety disorder, unspecified: Secondary | ICD-10-CM | POA: Diagnosis present

## 2015-03-22 DIAGNOSIS — G4700 Insomnia, unspecified: Secondary | ICD-10-CM | POA: Diagnosis present

## 2015-03-22 DIAGNOSIS — E039 Hypothyroidism, unspecified: Secondary | ICD-10-CM | POA: Diagnosis present

## 2015-03-22 DIAGNOSIS — E119 Type 2 diabetes mellitus without complications: Secondary | ICD-10-CM | POA: Diagnosis present

## 2015-03-22 DIAGNOSIS — G8918 Other acute postprocedural pain: Secondary | ICD-10-CM | POA: Diagnosis present

## 2015-03-22 DIAGNOSIS — G4730 Sleep apnea, unspecified: Secondary | ICD-10-CM | POA: Diagnosis present

## 2015-03-22 DIAGNOSIS — D62 Acute posthemorrhagic anemia: Secondary | ICD-10-CM | POA: Diagnosis not present

## 2015-03-22 DIAGNOSIS — M069 Rheumatoid arthritis, unspecified: Secondary | ICD-10-CM | POA: Diagnosis present

## 2015-03-22 DIAGNOSIS — Z86010 Personal history of colonic polyps: Secondary | ICD-10-CM | POA: Diagnosis not present

## 2015-03-22 HISTORY — PX: TOTAL KNEE ARTHROPLASTY: SHX125

## 2015-03-22 HISTORY — PX: JOINT REPLACEMENT: SHX530

## 2015-03-22 LAB — ABO/RH: ABO/RH(D): A POS

## 2015-03-22 LAB — TYPE AND SCREEN
ABO/RH(D): A POS
ANTIBODY SCREEN: NEGATIVE

## 2015-03-22 SURGERY — ARTHROPLASTY, KNEE, TOTAL
Anesthesia: General | Site: Knee | Laterality: Left

## 2015-03-22 MED ORDER — VANCOMYCIN HCL IN DEXTROSE 1-5 GM/200ML-% IV SOLN
INTRAVENOUS | Status: AC
  Filled 2015-03-22: qty 200

## 2015-03-22 MED ORDER — EPHEDRINE SULFATE 50 MG/ML IJ SOLN
INTRAMUSCULAR | Status: DC | PRN
  Administered 2015-03-22: 10 mg via INTRAVENOUS

## 2015-03-22 MED ORDER — METOCLOPRAMIDE HCL 5 MG PO TABS
5.0000 mg | ORAL_TABLET | Freq: Three times a day (TID) | ORAL | Status: DC | PRN
  Administered 2015-03-24: 10 mg via ORAL
  Filled 2015-03-22 (×2): qty 2

## 2015-03-22 MED ORDER — ONDANSETRON HCL 4 MG PO TABS
4.0000 mg | ORAL_TABLET | Freq: Four times a day (QID) | ORAL | Status: DC | PRN
  Administered 2015-03-24 – 2015-03-25 (×2): 4 mg via ORAL
  Filled 2015-03-22 (×2): qty 1

## 2015-03-22 MED ORDER — ONDANSETRON HCL 4 MG/2ML IJ SOLN
4.0000 mg | Freq: Four times a day (QID) | INTRAMUSCULAR | Status: DC | PRN
  Administered 2015-03-23: 4 mg via INTRAVENOUS
  Filled 2015-03-22 (×2): qty 2

## 2015-03-22 MED ORDER — FENTANYL CITRATE 0.05 MG/ML IJ SOLN
INTRAMUSCULAR | Status: AC
  Filled 2015-03-22: qty 5

## 2015-03-22 MED ORDER — OXYCODONE HCL 5 MG/5ML PO SOLN: 5.0000 mg | Freq: Once | ORAL | Status: DC | PRN

## 2015-03-22 MED ORDER — PHENYLEPHRINE HCL 10 MG/ML IJ SOLN
INTRAMUSCULAR | Status: DC | PRN
  Administered 2015-03-22 (×2): 80 ug via INTRAVENOUS

## 2015-03-22 MED ORDER — DEXTROSE 5 % IV SOLN: 3.0000 g | Freq: Once | INTRAVENOUS | Status: DC

## 2015-03-22 MED ORDER — ZOLPIDEM TARTRATE 5 MG PO TABS
5.0000 mg | ORAL_TABLET | Freq: Every evening | ORAL | Status: DC | PRN
  Administered 2015-03-23: 5 mg via ORAL
  Filled 2015-03-22: qty 1

## 2015-03-22 MED ORDER — HYDROMORPHONE HCL 1 MG/ML IJ SOLN
0.2500 mg | INTRAMUSCULAR | Status: DC | PRN
  Administered 2015-03-22 (×6): 0.5 mg via INTRAVENOUS

## 2015-03-22 MED ORDER — HYDROMORPHONE HCL 1 MG/ML IJ SOLN
INTRAMUSCULAR | Status: AC
  Filled 2015-03-22: qty 1

## 2015-03-22 MED ORDER — ACETAMINOPHEN 10 MG/ML IV SOLN
1000.0000 mg | Freq: Once | INTRAVENOUS | Status: AC
  Administered 2015-03-22: 1000 mg via INTRAVENOUS
  Filled 2015-03-22: qty 100

## 2015-03-22 MED ORDER — LEVOTHYROXINE SODIUM 75 MCG PO TABS
75.0000 ug | ORAL_TABLET | Freq: Every day | ORAL | Status: DC
  Administered 2015-03-22: 75 ug via ORAL
  Filled 2015-03-22 (×3): qty 1

## 2015-03-22 MED ORDER — ACETAMINOPHEN 325 MG PO TABS
650.0000 mg | ORAL_TABLET | Freq: Four times a day (QID) | ORAL | Status: DC | PRN
  Administered 2015-03-23 – 2015-03-25 (×6): 650 mg via ORAL
  Filled 2015-03-22 (×6): qty 2

## 2015-03-22 MED ORDER — KETAMINE HCL 10 MG/ML IJ SOLN
INTRAMUSCULAR | Status: DC | PRN
  Administered 2015-03-22 (×2): 15 mg via INTRAVENOUS

## 2015-03-22 MED ORDER — RIVAROXABAN 10 MG PO TABS: 10.0000 mg | ORAL_TABLET | Freq: Every day | ORAL | Status: DC

## 2015-03-22 MED ORDER — METHOCARBAMOL 1000 MG/10ML IJ SOLN
500.0000 mg | Freq: Four times a day (QID) | INTRAMUSCULAR | Status: DC | PRN
  Administered 2015-03-22 – 2015-03-23 (×2): 500 mg via INTRAVENOUS
  Filled 2015-03-22 (×5): qty 5

## 2015-03-22 MED ORDER — GLYCOPYRROLATE 0.2 MG/ML IJ SOLN
INTRAMUSCULAR | Status: DC | PRN
  Administered 2015-03-22: 0.4 mg via INTRAVENOUS

## 2015-03-22 MED ORDER — VANCOMYCIN HCL IN DEXTROSE 1-5 GM/200ML-% IV SOLN
1000.0000 mg | Freq: Two times a day (BID) | INTRAVENOUS | Status: AC
  Administered 2015-03-22: 1000 mg via INTRAVENOUS
  Filled 2015-03-22: qty 200

## 2015-03-22 MED ORDER — PROMETHAZINE HCL 25 MG/ML IJ SOLN
INTRAMUSCULAR | Status: AC
  Filled 2015-03-22: qty 1

## 2015-03-22 MED ORDER — ACETAMINOPHEN 650 MG RE SUPP: 650.0000 mg | Freq: Four times a day (QID) | RECTAL | Status: DC | PRN

## 2015-03-22 MED ORDER — SUCCINYLCHOLINE CHLORIDE 20 MG/ML IJ SOLN
INTRAMUSCULAR | Status: DC | PRN
  Administered 2015-03-22: 100 mg via INTRAVENOUS

## 2015-03-22 MED ORDER — KETOROLAC TROMETHAMINE 30 MG/ML IJ SOLN
15.0000 mg | Freq: Once | INTRAMUSCULAR | Status: AC | PRN
  Administered 2015-03-22: 15 mg via INTRAVENOUS

## 2015-03-22 MED ORDER — BUPIVACAINE HCL (PF) 0.25 % IJ SOLN
INTRAMUSCULAR | Status: AC
  Filled 2015-03-22: qty 60

## 2015-03-22 MED ORDER — MENTHOL 3 MG MT LOZG: 1.0000 | LOZENGE | OROMUCOSAL | Status: DC | PRN

## 2015-03-22 MED ORDER — SODIUM CHLORIDE 0.9 % IR SOLN
Status: DC | PRN
  Administered 2015-03-22: 2000 mL

## 2015-03-22 MED ORDER — RISAQUAD PO CAPS
1.0000 | ORAL_CAPSULE | Freq: Every day | ORAL | Status: DC
  Administered 2015-03-23 – 2015-03-25 (×3): 1 via ORAL
  Filled 2015-03-22 (×3): qty 1

## 2015-03-22 MED ORDER — METOCLOPRAMIDE HCL 5 MG/ML IJ SOLN: 5.0000 mg | Freq: Three times a day (TID) | INTRAMUSCULAR | Status: DC | PRN

## 2015-03-22 MED ORDER — SODIUM CHLORIDE 0.9 % IR SOLN
Status: AC
  Filled 2015-03-22: qty 1

## 2015-03-22 MED ORDER — METHOCARBAMOL 500 MG PO TABS
500.0000 mg | ORAL_TABLET | Freq: Four times a day (QID) | ORAL | Status: DC | PRN
  Administered 2015-03-22 – 2015-03-25 (×8): 500 mg via ORAL
  Filled 2015-03-22 (×8): qty 1

## 2015-03-22 MED ORDER — METHOCARBAMOL 500 MG PO TABS: 500.0000 mg | ORAL_TABLET | Freq: Three times a day (TID) | ORAL | Status: DC | PRN

## 2015-03-22 MED ORDER — DOCUSATE SODIUM 100 MG PO CAPS
100.0000 mg | ORAL_CAPSULE | Freq: Two times a day (BID) | ORAL | Status: DC
  Administered 2015-03-22 – 2015-03-25 (×6): 100 mg via ORAL

## 2015-03-22 MED ORDER — MAGNESIUM CITRATE PO SOLN: 1.0000 | Freq: Once | ORAL | Status: AC | PRN

## 2015-03-22 MED ORDER — BUSPIRONE HCL 5 MG PO TABS
5.0000 mg | ORAL_TABLET | Freq: Two times a day (BID) | ORAL | Status: DC
  Administered 2015-03-22 – 2015-03-25 (×6): 5 mg via ORAL
  Filled 2015-03-22 (×7): qty 1

## 2015-03-22 MED ORDER — ROCURONIUM BROMIDE 100 MG/10ML IV SOLN
INTRAVENOUS | Status: DC | PRN
  Administered 2015-03-22: 25 mg via INTRAVENOUS

## 2015-03-22 MED ORDER — MIDAZOLAM HCL 5 MG/5ML IJ SOLN
INTRAMUSCULAR | Status: DC | PRN
  Administered 2015-03-22: 2 mg via INTRAVENOUS

## 2015-03-22 MED ORDER — ROCURONIUM BROMIDE 100 MG/10ML IV SOLN
INTRAVENOUS | Status: AC
  Filled 2015-03-22: qty 1

## 2015-03-22 MED ORDER — HYDROMORPHONE HCL 1 MG/ML IJ SOLN
1.0000 mg | INTRAMUSCULAR | Status: DC | PRN
Start: 2015-03-22 — End: 2015-03-25
  Administered 2015-03-22 – 2015-03-24 (×11): 1 mg via INTRAVENOUS
  Filled 2015-03-22 (×10): qty 1

## 2015-03-22 MED ORDER — PROMETHAZINE HCL 25 MG/ML IJ SOLN
6.2500 mg | INTRAMUSCULAR | Status: AC | PRN
  Administered 2015-03-22 (×2): 12.5 mg via INTRAVENOUS

## 2015-03-22 MED ORDER — DEXAMETHASONE SODIUM PHOSPHATE 10 MG/ML IJ SOLN
INTRAMUSCULAR | Status: AC
  Filled 2015-03-22: qty 1

## 2015-03-22 MED ORDER — PHENYLEPHRINE 40 MCG/ML (10ML) SYRINGE FOR IV PUSH (FOR BLOOD PRESSURE SUPPORT)
PREFILLED_SYRINGE | INTRAVENOUS | Status: AC
  Filled 2015-03-22: qty 10

## 2015-03-22 MED ORDER — HYDROMORPHONE HCL 1 MG/ML IJ SOLN: 0.2500 mg | INTRAMUSCULAR | Status: DC | PRN

## 2015-03-22 MED ORDER — OXYCODONE-ACETAMINOPHEN 5-325 MG PO TABS: 1.0000 | ORAL_TABLET | ORAL | Status: DC | PRN

## 2015-03-22 MED ORDER — BISACODYL 5 MG PO TBEC: 5.0000 mg | DELAYED_RELEASE_TABLET | Freq: Every day | ORAL | Status: DC | PRN

## 2015-03-22 MED ORDER — HYDROMORPHONE HCL 1 MG/ML IJ SOLN
INTRAMUSCULAR | Status: DC | PRN
  Administered 2015-03-22 (×4): 0.5 mg via INTRAVENOUS

## 2015-03-22 MED ORDER — ALUM & MAG HYDROXIDE-SIMETH 200-200-20 MG/5ML PO SUSP: 30.0000 mL | ORAL | Status: DC | PRN

## 2015-03-22 MED ORDER — KETOROLAC TROMETHAMINE 30 MG/ML IJ SOLN: 30.0000 mg | Freq: Once | INTRAMUSCULAR | Status: DC | PRN

## 2015-03-22 MED ORDER — MIDAZOLAM HCL 2 MG/2ML IJ SOLN
INTRAMUSCULAR | Status: AC
  Filled 2015-03-22: qty 2

## 2015-03-22 MED ORDER — GLYCOPYRROLATE 0.2 MG/ML IJ SOLN
INTRAMUSCULAR | Status: AC
  Filled 2015-03-22: qty 2

## 2015-03-22 MED ORDER — SENNOSIDES-DOCUSATE SODIUM 8.6-50 MG PO TABS: 1.0000 | ORAL_TABLET | Freq: Every evening | ORAL | Status: DC | PRN

## 2015-03-22 MED ORDER — LIDOCAINE HCL (CARDIAC) 20 MG/ML IV SOLN
INTRAVENOUS | Status: AC
  Filled 2015-03-22: qty 5

## 2015-03-22 MED ORDER — DOCUSATE SODIUM 100 MG PO CAPS: 100.0000 mg | ORAL_CAPSULE | Freq: Two times a day (BID) | ORAL | Status: DC | PRN

## 2015-03-22 MED ORDER — FENTANYL CITRATE 0.05 MG/ML IJ SOLN
INTRAMUSCULAR | Status: DC | PRN
  Administered 2015-03-22: 50 ug via INTRAVENOUS
  Administered 2015-03-22: 100 ug via INTRAVENOUS
  Administered 2015-03-22: 50 ug via INTRAVENOUS
  Administered 2015-03-22: 100 ug via INTRAVENOUS
  Administered 2015-03-22 (×4): 50 ug via INTRAVENOUS

## 2015-03-22 MED ORDER — BUPIVACAINE-EPINEPHRINE 0.25% -1:200000 IJ SOLN
INTRAMUSCULAR | Status: AC
  Filled 2015-03-22: qty 1

## 2015-03-22 MED ORDER — KCL IN DEXTROSE-NACL 20-5-0.45 MEQ/L-%-% IV SOLN
INTRAVENOUS | Status: AC
  Administered 2015-03-22 – 2015-03-23 (×2): via INTRAVENOUS
  Filled 2015-03-22 (×2): qty 1000

## 2015-03-22 MED ORDER — KETOROLAC TROMETHAMINE 15 MG/ML IJ SOLN
INTRAMUSCULAR | Status: AC
  Administered 2015-03-22: 15 mg
  Filled 2015-03-22: qty 1

## 2015-03-22 MED ORDER — RIVAROXABAN 10 MG PO TABS
10.0000 mg | ORAL_TABLET | Freq: Every day | ORAL | Status: DC
  Administered 2015-03-23 – 2015-03-25 (×3): 10 mg via ORAL
  Filled 2015-03-22 (×4): qty 1

## 2015-03-22 MED ORDER — PROPOFOL 10 MG/ML IV BOLUS
INTRAVENOUS | Status: AC
  Filled 2015-03-22: qty 20

## 2015-03-22 MED ORDER — B-12 250 MCG PO TABS
250.0000 ug | ORAL_TABLET | Freq: Every day | ORAL | Status: DC
  Filled 2015-03-22: qty 1

## 2015-03-22 MED ORDER — CEFAZOLIN SODIUM-DEXTROSE 2-3 GM-% IV SOLR
2.0000 g | Freq: Once | INTRAVENOUS | Status: AC
  Administered 2015-03-22: 2 g via INTRAVENOUS

## 2015-03-22 MED ORDER — NEOSTIGMINE METHYLSULFATE 10 MG/10ML IV SOLN
INTRAVENOUS | Status: DC | PRN
  Administered 2015-03-22: 3 mg via INTRAVENOUS

## 2015-03-22 MED ORDER — HYDROMORPHONE HCL 2 MG/ML IJ SOLN
INTRAMUSCULAR | Status: AC
  Filled 2015-03-22: qty 1

## 2015-03-22 MED ORDER — SODIUM CHLORIDE 0.9 % IR SOLN
Status: DC | PRN
  Administered 2015-03-22: 500 mL

## 2015-03-22 MED ORDER — ONDANSETRON HCL 4 MG/2ML IJ SOLN
INTRAMUSCULAR | Status: AC
  Filled 2015-03-22: qty 2

## 2015-03-22 MED ORDER — DIPHENHYDRAMINE HCL 12.5 MG/5ML PO ELIX: 12.5000 mg | ORAL_SOLUTION | ORAL | Status: DC | PRN

## 2015-03-22 MED ORDER — PROPOFOL 10 MG/ML IV BOLUS
INTRAVENOUS | Status: DC | PRN
Start: 2015-03-22 — End: 2015-03-22
  Administered 2015-03-22: 200 mg via INTRAVENOUS

## 2015-03-22 MED ORDER — LIDOCAINE HCL (CARDIAC) 20 MG/ML IV SOLN
INTRAVENOUS | Status: DC | PRN
  Administered 2015-03-22: 100 mg via INTRAVENOUS

## 2015-03-22 MED ORDER — CEFAZOLIN SODIUM-DEXTROSE 2-3 GM-% IV SOLR
INTRAVENOUS | Status: AC
Start: 2015-03-22 — End: 2015-03-22
  Filled 2015-03-22: qty 50

## 2015-03-22 MED ORDER — OXYCODONE HCL 5 MG PO TABS
5.0000 mg | ORAL_TABLET | ORAL | Status: DC | PRN
  Administered 2015-03-22 (×3): 10 mg via ORAL
  Administered 2015-03-22: 5 mg via ORAL
  Administered 2015-03-23 (×3): 10 mg via ORAL
  Filled 2015-03-22 (×6): qty 2
  Filled 2015-03-22: qty 1

## 2015-03-22 MED ORDER — PHENOL 1.4 % MT LIQD
1.0000 | OROMUCOSAL | Status: DC | PRN
Start: 2015-03-22 — End: 2015-03-25

## 2015-03-22 MED ORDER — SODIUM CHLORIDE 0.9 % IJ SOLN
INTRAMUSCULAR | Status: AC
  Filled 2015-03-22: qty 10

## 2015-03-22 MED ORDER — SCOPOLAMINE 1 MG/3DAYS TD PT72
MEDICATED_PATCH | TRANSDERMAL | Status: DC | PRN
  Administered 2015-03-22: 1 via TRANSDERMAL

## 2015-03-22 MED ORDER — ONDANSETRON HCL 4 MG/2ML IJ SOLN
INTRAMUSCULAR | Status: DC | PRN
  Administered 2015-03-22: 4 mg via INTRAVENOUS

## 2015-03-22 MED ORDER — BUPIVACAINE-EPINEPHRINE 0.25% -1:200000 IJ SOLN
INTRAMUSCULAR | Status: DC | PRN
  Administered 2015-03-22: 50 mL

## 2015-03-22 MED ORDER — NEOSTIGMINE METHYLSULFATE 10 MG/10ML IV SOLN
INTRAVENOUS | Status: AC
  Filled 2015-03-22: qty 1

## 2015-03-22 MED ORDER — DEXAMETHASONE SODIUM PHOSPHATE 10 MG/ML IJ SOLN
INTRAMUSCULAR | Status: DC | PRN
  Administered 2015-03-22: 10 mg via INTRAVENOUS

## 2015-03-22 MED ORDER — OXYCODONE HCL 5 MG PO TABS: 5.0000 mg | ORAL_TABLET | Freq: Once | ORAL | Status: DC | PRN

## 2015-03-22 MED ORDER — TRAMADOL HCL 50 MG PO TABS
50.0000 mg | ORAL_TABLET | Freq: Four times a day (QID) | ORAL | Status: DC | PRN
  Administered 2015-03-23 – 2015-03-24 (×3): 50 mg via ORAL
  Filled 2015-03-22 (×3): qty 1

## 2015-03-22 MED ORDER — LACTATED RINGERS IV SOLN
INTRAVENOUS | Status: DC
  Administered 2015-03-22 (×3): via INTRAVENOUS

## 2015-03-22 MED ORDER — LORATADINE 10 MG PO TABS
10.0000 mg | ORAL_TABLET | Freq: Every day | ORAL | Status: DC
  Administered 2015-03-22 – 2015-03-25 (×4): 10 mg via ORAL
  Filled 2015-03-22 (×4): qty 1

## 2015-03-22 MED ORDER — KETAMINE HCL 10 MG/ML IJ SOLN
INTRAMUSCULAR | Status: AC
  Filled 2015-03-22: qty 1

## 2015-03-22 MED ORDER — CITALOPRAM HYDROBROMIDE 20 MG PO TABS
20.0000 mg | ORAL_TABLET | Freq: Every day | ORAL | Status: DC
  Administered 2015-03-22 – 2015-03-24 (×3): 20 mg via ORAL
  Filled 2015-03-22 (×4): qty 1

## 2015-03-22 MED ORDER — EPHEDRINE SULFATE 50 MG/ML IJ SOLN
INTRAMUSCULAR | Status: AC
  Filled 2015-03-22: qty 1

## 2015-03-22 MED ORDER — VANCOMYCIN HCL IN DEXTROSE 1-5 GM/200ML-% IV SOLN
1000.0000 mg | INTRAVENOUS | Status: AC
  Administered 2015-03-22: 1000 mg via INTRAVENOUS

## 2015-03-22 SURGICAL SUPPLY — 71 items
BAG ZIPLOCK 12X15 (MISCELLANEOUS) IMPLANT
BANDAGE ELASTIC 4 VELCRO ST LF (GAUZE/BANDAGES/DRESSINGS) ×2 IMPLANT
BANDAGE ELASTIC 6 VELCRO ST LF (GAUZE/BANDAGES/DRESSINGS) ×2 IMPLANT
BANDAGE ESMARK 6X9 LF (GAUZE/BANDAGES/DRESSINGS) ×1 IMPLANT
BLADE SAG 18X100X1.27 (BLADE) ×2 IMPLANT
BLADE SAW SGTL 13.0X1.19X90.0M (BLADE) ×2 IMPLANT
BNDG ESMARK 6X9 LF (GAUZE/BANDAGES/DRESSINGS) ×2
CAP KNEE TOTAL 3 SIGMA ×2 IMPLANT
CEMENT HV SMART SET (Cement) ×4 IMPLANT
CHLORAPREP W/TINT 26ML (MISCELLANEOUS) IMPLANT
CLOTH 2% CHLOROHEXIDINE 3PK (PERSONAL CARE ITEMS) ×2 IMPLANT
CUFF TOURN SGL QUICK 34 (TOURNIQUET CUFF) ×1
CUFF TRNQT CYL 34X4X40X1 (TOURNIQUET CUFF) ×1 IMPLANT
DECANTER SPIKE VIAL GLASS SM (MISCELLANEOUS) ×2 IMPLANT
DRAPE INCISE IOBAN 66X45 STRL (DRAPES) IMPLANT
DRAPE ORTHO SPLIT 77X108 STRL (DRAPES) ×2
DRAPE POUCH INSTRU U-SHP 10X18 (DRAPES) ×2 IMPLANT
DRAPE SHEET LG 3/4 BI-LAMINATE (DRAPES) ×4 IMPLANT
DRAPE SURG ORHT 6 SPLT 77X108 (DRAPES) ×2 IMPLANT
DRAPE U-SHAPE 47X51 STRL (DRAPES) ×2 IMPLANT
DRSG ADAPTIC 3X8 NADH LF (GAUZE/BANDAGES/DRESSINGS) IMPLANT
DRSG AQUACEL AG ADV 3.5X10 (GAUZE/BANDAGES/DRESSINGS) ×2 IMPLANT
DRSG PAD ABDOMINAL 8X10 ST (GAUZE/BANDAGES/DRESSINGS) IMPLANT
DRSG TEGADERM 4X4.75 (GAUZE/BANDAGES/DRESSINGS) IMPLANT
DURAPREP 26ML APPLICATOR (WOUND CARE) ×2 IMPLANT
ELECT REM PT RETURN 9FT ADLT (ELECTROSURGICAL) ×2
ELECTRODE REM PT RTRN 9FT ADLT (ELECTROSURGICAL) ×1 IMPLANT
EVACUATOR 1/8 PVC DRAIN (DRAIN) ×2 IMPLANT
FACESHIELD WRAPAROUND (MASK) ×10 IMPLANT
GAUZE SPONGE 2X2 8PLY STRL LF (GAUZE/BANDAGES/DRESSINGS) IMPLANT
GAUZE SPONGE 4X4 12PLY STRL (GAUZE/BANDAGES/DRESSINGS) IMPLANT
GLOVE BIOGEL PI IND STRL 7.5 (GLOVE) ×1 IMPLANT
GLOVE BIOGEL PI IND STRL 8 (GLOVE) ×1 IMPLANT
GLOVE BIOGEL PI INDICATOR 7.5 (GLOVE) ×1
GLOVE BIOGEL PI INDICATOR 8 (GLOVE) ×1
GLOVE SURG SS PI 7.5 STRL IVOR (GLOVE) ×2 IMPLANT
GLOVE SURG SS PI 8.0 STRL IVOR (GLOVE) ×4 IMPLANT
GOWN STRL REUS W/TWL XL LVL3 (GOWN DISPOSABLE) ×4 IMPLANT
HANDPIECE INTERPULSE COAX TIP (DISPOSABLE) ×1
IMMOBILIZER KNEE 20 (SOFTGOODS) ×2
IMMOBILIZER KNEE 20 THIGH 36 (SOFTGOODS) ×1 IMPLANT
KIT BASIN OR (CUSTOM PROCEDURE TRAY) ×2 IMPLANT
MANIFOLD NEPTUNE II (INSTRUMENTS) ×2 IMPLANT
NDL SAFETY ECLIPSE 18X1.5 (NEEDLE) ×1 IMPLANT
NEEDLE HYPO 18GX1.5 SHARP (NEEDLE) ×1
NS IRRIG 1000ML POUR BTL (IV SOLUTION) IMPLANT
PACK TOTAL JOINT (CUSTOM PROCEDURE TRAY) ×2 IMPLANT
PADDING CAST COTTON 6X4 STRL (CAST SUPPLIES) IMPLANT
PEN SKIN MARKING BROAD (MISCELLANEOUS) ×2 IMPLANT
POSITIONER SURGICAL ARM (MISCELLANEOUS) ×2 IMPLANT
SET HNDPC FAN SPRY TIP SCT (DISPOSABLE) ×1 IMPLANT
SPONGE GAUZE 2X2 STER 10/PKG (GAUZE/BANDAGES/DRESSINGS)
SPONGE SURGIFOAM ABS GEL 100 (HEMOSTASIS) IMPLANT
STAPLER VISISTAT (STAPLE) IMPLANT
STRIP CLOSURE SKIN 1/2X4 (GAUZE/BANDAGES/DRESSINGS) ×2 IMPLANT
SUCTION FRAZIER 12FR DISP (SUCTIONS) ×2 IMPLANT
SUT BONE WAX W31G (SUTURE) IMPLANT
SUT MNCRL AB 4-0 PS2 18 (SUTURE) IMPLANT
SUT VIC AB 1 CT1 27 (SUTURE) ×2
SUT VIC AB 1 CT1 27XBRD ANTBC (SUTURE) ×2 IMPLANT
SUT VIC AB 2-0 CT1 27 (SUTURE) ×3
SUT VIC AB 2-0 CT1 TAPERPNT 27 (SUTURE) ×3 IMPLANT
SUT VLOC 180 0 24IN GS25 (SUTURE) ×2 IMPLANT
SYR 50ML LL SCALE MARK (SYRINGE) ×2 IMPLANT
TOWEL OR 17X26 10 PK STRL BLUE (TOWEL DISPOSABLE) ×2 IMPLANT
TOWEL OR NON WOVEN STRL DISP B (DISPOSABLE) ×2 IMPLANT
TOWER CARTRIDGE SMART MIX (DISPOSABLE) ×2 IMPLANT
TRAY FOLEY W/METER SILVER 14FR (SET/KITS/TRAYS/PACK) ×2 IMPLANT
WATER STERILE IRR 1500ML POUR (IV SOLUTION) ×2 IMPLANT
WRAP KNEE MAXI GEL POST OP (GAUZE/BANDAGES/DRESSINGS) ×2 IMPLANT
YANKAUER SUCT BULB TIP 10FT TU (MISCELLANEOUS) ×2 IMPLANT

## 2015-03-22 NOTE — Brief Op Note (Signed)
03/22/2015  9:07 AM  PATIENT:  Ahmed Prima  54 y.o. female  PRE-OPERATIVE DIAGNOSIS:  LEFT KNEE DJD   POST-OPERATIVE DIAGNOSIS:  LEFT KNEE DJD   PROCEDURE:  Procedure(s): LEFT TOTAL KNEE ARTHROPLASTY (Left)  SURGEON:  Surgeon(s) and Role:    * Susa Day, MD - Primary  PHYSICIAN ASSISTANT:   ASSISTANTS: Bissell   ANESTHESIA:   general  EBL:  Total I/O In: 1000 [I.V.:1000] Out: 100 [Urine:100]  BLOOD ADMINISTERED:none  DRAINS: none   LOCAL MEDICATIONS USED:  MARCAINE     SPECIMEN:  No Specimen  DISPOSITION OF SPECIMEN:  N/A  COUNTS:  YES  TOURNIQUET:   Total Tourniquet Time Documented: Thigh (Right) - 55 minutes Total: Thigh (Right) - 55 minutes   DICTATION: .Other Dictation: Dictation Number [id number]  PLAN OF CARE: Admit to inpatient   PATIENT DISPOSITION:  PACU - hemodynamically stable.   Delay start of Pharmacological VTE agent (>24hrs) due to surgical blood loss or risk of bleeding: no

## 2015-03-22 NOTE — Anesthesia Procedure Notes (Signed)
Procedure Name: Intubation Date/Time: 03/22/2015 7:35 AM Performed by: Maxwell Caul Pre-anesthesia Checklist: Patient identified, Emergency Drugs available, Suction available and Patient being monitored Patient Re-evaluated:Patient Re-evaluated prior to inductionOxygen Delivery Method: Circle System Utilized Preoxygenation: Pre-oxygenation with 100% oxygen Intubation Type: IV induction Ventilation: Mask ventilation without difficulty Laryngoscope Size: Mac and 4 Grade View: Grade I Tube type: Oral Tube size: 7.5 mm Number of attempts: 1 Airway Equipment and Method: Stylet and Oral airway Placement Confirmation: ETT inserted through vocal cords under direct vision,  positive ETCO2 and breath sounds checked- equal and bilateral Secured at: 21 cm Tube secured with: Tape Dental Injury: Teeth and Oropharynx as per pre-operative assessment

## 2015-03-22 NOTE — Anesthesia Postprocedure Evaluation (Signed)
  Anesthesia Post-op Note  Patient: Traci Bird  Procedure(s) Performed: Procedure(s): LEFT TOTAL KNEE ARTHROPLASTY (Left)  Patient Location: PACU  Anesthesia Type:General  Level of Consciousness: awake and alert   Airway and Oxygen Therapy: Patient Spontanous Breathing  Post-op Pain: moderate  Post-op Assessment: Post-op Vital signs reviewed  Post-op Vital Signs: stable  Last Vitals:  Filed Vitals:   03/22/15 1145  BP: 115/70  Pulse: 88  Temp: 37.1 C  Resp: 14    Complications: No apparent anesthesia complications

## 2015-03-22 NOTE — Interval H&P Note (Signed)
History and Physical Interval Note:  03/22/2015 7:26 AM  Traci Bird  has presented today for surgery, with the diagnosis of LEFT KNEE DJD   The various methods of treatment have been discussed with the patient and family. After consideration of risks, benefits and other options for treatment, the patient has consented to  Procedure(s): LEFT TOTAL KNEE ARTHROPLASTY (Left) as a surgical intervention .  The patient's history has been reviewed, patient examined, no change in status, stable for surgery.  I have reviewed the patient's chart and labs.  Questions were answered to the patient's satisfaction.     [name]

## 2015-03-22 NOTE — Op Note (Signed)
Traci Bird, BONIFAS              ACCOUNT NO.:  [id number]  MEDICAL RECORD NO.:  [id number]  LOCATION:  [facility]                         FACILITY:  [facility]  PHYSICIAN:  Susa Day, M.D.    DATE OF BIRTH:  04/16/1961  DATE OF PROCEDURE:  03/22/2015 DATE OF DISCHARGE:                              OPERATIVE REPORT   PREOPERATIVE DIAGNOSIS:  Degenerative joint disease, end-stage, left knee.  POSTOPERATIVE DIAGNOSIS:  Degenerative joint disease, end-stage, left knee.  PROCEDURE PERFORMED:  Left total knee arthroplasty utilizing DePuy rotating platform, 2.5 femur, 2.5 tibia, 10 mm insert, 35 patella.  SURGEON:  Susa Day, M.D.  ASSISTANT:  Cleophas Dunker, PA, was used for the patient positioning, closure, exposure.  HISTORY:  A 54 year old with bone-on-bone arthrosis, particularly patellofemoral joint, medial compartment, tibial plateau had been refractory to conservative treatment and negative affect on her activities of daily living.  She was indicated for replacement of the degenerated joint.  Risk and benefits discussed including bleeding, infection, damage to neurovascular structures, DVT, PE, anesthetic complications, etc.  TECHNIQUE:  With the patient in supine position after induction of adequate general anesthesia, 1 g of vancomycin and 2 g of Kefzol, left lower extremity was prepped, draped, and exsanguinated in usual sterile fashion.  Thigh tourniquet inflated to 250 mmHg.  Midline incision was made over the knee.  Full-thickness flaps developed.  Median parapatellar arthrotomy performed.  Patella everted and knee flexed. Grade 4 changes.  Sulcus, entire patella, medial and tibial plateau, and femoral condyle were noted.  Remnants of the medial lateral menisci were removed with Leksell rongeur and cauterized geniculates.  Elevated soft tissues medially preserving the MCL.  Step drill utilized to enter the femoral canal, was irrigated, 5-degree left, 10 off distal  femur was then utilized for femoral cut.  Cut was performed without difficulty. We then sized the femur off the anterior cortex to 2.5 pin.  We performed our anterior, posterior, and chamfer cuts protecting the soft tissues.  Attention turned towards the tibia.  Retractors were utilized. External alignment guide used 10 off the high side, which was lateral, parallel to the tibial shaft bisecting tibiotalar joint, slight slope. We performed our tibial cut.  We checked our flexion-extension gaps, they were equivalent.  Turned our attention towards completing the tibia was sized, maximized to a 2.5 just to the medial aspect of tibial tubercle.  It was pinned, drilled our central hole, punch guide performed.  Completed the femur, then with a box cut bisecting the canal.  Performed our cut without difficulty.  A trial femur and tibia were then placed and 10-mm insert was utilized, we had full extension and full flexion, negative anterior drawer.  Good stability with varus and valgus stressing from 0 to 30 degrees.  Then turned our attention towards the patella, measured at 22.  We planed it to a 15, utilizing external alignment guide and a patellar jig.  We then drilled our peg holes medializing them with a 35 guide.  Placed a trial patella with excellent patellofemoral tracking.  All the trials were removed.  We checked posteriorly, removed any residual osteophytes and cauterized the geniculate.  Popliteus was intact.  Flexed the knee, used  our retractors, subluxed the tibia.  Thoroughly dried all surfaces after using pulsatile lavage to clean all surfaces.  Cement was mixed on the back table in the appropriate fashion.  We injected into the tibial canal digitally pressurizing the canal, we impacted our tibial tray, 2.5, redundant cement removed.  We impacted the cemented femoral component, redundant cement removed.  Placed a trial 10 insert, reduced it and held that with an axial load  throughout the curing of the cement, cemented the patella as well.  A 0.25% Marcaine to inject in the periarticular tissues.  After setting of the cement, the knee was flexed.  Insert removed.  Meticulously removed redundant cement. Copiously irrigated the wound.  We had trialed and attempts appeared to maximize the stability, we placed a 10 permanent insert.  We had full extension, full flexion, good stability with varus and valgus stressing from 0 to 30 degrees and excellent patellofemoral tracking.  Then, deflated the tourniquet.  We cauterized any visualized bleeders.  We approximated the patellar arthrotomy with #1 Vicryl and then we used a running V-Loc.  Following this, we had full flexion and full extension and excellent stability.  Copiously irrigated the wound.  Multiple 2-0 layers and then 4-0 subcuticular Prolene.  Sterile dressing applied, flexion to gravity at 90 degrees.  This was secured with an Ace bandage. The patient was extubated without difficulty, and transported to the recovery room in satisfactory condition.  The patient tolerated the procedure well.  No complications.  Assistant is Cleophas Dunker, [place].  Minimal blood loss.     Susa Day, M.D.     Geralynn Rile  D:  03/22/2015  T:  03/22/2015  Job:  [id number]

## 2015-03-22 NOTE — H&P (View-Only) (Signed)
Traci Bird DOB: [birth year] Married / Language: English / Race: White Female  H&P Date: 03/13/15  Chief complaint: Left knee pain  History of Present Illness  The patient is a 54 year old female who comes in today for a preoperative History and Physical. The patient is scheduled for a left total knee arthroplasty to be performed by Dr. Johnn Hai, MD at [facility] on 03/22/15. She is s/p L knee scope 3 months ago, chondroplasty PF jt, lateral release, grade 3 changes medial/lateral tibial plateau and grade 4 PF. She is still having a difficult time with steps, having to go one at a time leading with her other leg, up and down the stairs. She is no longer having any catching in the knee when she turns over at night, but still having pain with that. She still feels like the knee is giving way on her when she is walking for extended periods of time. She's noting increased pain with flexing the knee up recently and increased nighttime pain. She has done viscosupplementation injections and steroid injections in the past with only short term relief. She's in pain mgmt with Dr. Nelva Bush and on Norco long-term. Her pain is interfering with ADL's at this point.  Dr. Tonita Cong and the patient mutually agreed to proceed with a total knee replacement. Risks and benefits of the procedure were discussed including stiffness, suboptimal range of motion, persistent pain, infection requiring removal of prosthesis and reinsertion, need for prophylactic antibiotics in the future, for example, dental procedures, possible need for manipulation, revision in the future and also anesthetic complications including DVT, PE, etc. We discussed the perioperative course, time in the hospital, postoperative recovery and the need for elevation to control swelling. We also discussed the predicted range of motion and the probability that squatting and kneeling would be unobtainable in the future. In addition, postoperative  anticoagulation was discussed. We have obtained preoperative medical clearance as necessary from her PCP. Provided her illustrated handout and discussed it in detail. They will enroll in the total joint replacement educational forum at the hospital. Her [facility] pre-op appt is scheduled for tomorrow. She reports she does have the option to work from home and to work part-time.  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  Family History Hypertension First Degree Relatives. mother and sister Diabetes Mellitus mother and father Heart Disease father and grandmother mothers side Rheumatoid Arthritis mother, sister and grandmother mothers side Cerebrovascular Accident grandfather mothers side First Degree Relatives  Social History Tobacco use Never smoker. never smoker Living situation Lives with spouse. 2 steps to enter, one level home Post-Surgical Plans home with HHPT Alcohol use never consumed alcohol Drug/Alcohol Rehab (Previously) no Pain Contract no Illicit drug use no Current work status working full time Marital status married Number of flights of stairs before winded 2-3 Tobacco / smoke exposure no Children 2 Drug/Alcohol Rehab (Currently) no Exercise Exercises weekly; does running / walking  Medication History BusPIRone HCl (10MG  Tablet, Oral) Active. Cetirizine HCl (10MG  Tablet, Oral) Active. Levothyroxine Sodium (75MCG Tablet, Oral) Active. Citalopram Hydrobromide (40MG  Tablet, Oral) Active. Norco (5-325MG  Tablet,  1 (one) Oral every six to eight hours as needed,  Taken starting 08/17/2013) Inactive. (JCB/JMB/SSJ Scammon @ [id number]) Atorvastatin Calcium (10MG  Tablet, Oral) Active. Zolpidem Tartrate (10MG  Tablet, Oral) Active. (prn) TraMADol HCl (50MG  Tablet, Oral) Active. (prn during the day) Promethazine HCl (25MG  Tablet, Oral) Active. Methocarbamol (500MG  Tablet, Oral) Active. Medications Reconciled  Past Surgical History Breast Reconstruction bilateral Carpal Tunnel Repair bilateral Mammoplasty; Reduction bilateral Hysterectomy complete (non-cancerous) Colon Polyp Removal - Colonoscopy Thyroidectomy; Subtotal right Ankle Surgery left Straighten Nasal Septum Gastric Bypass Arthroscopic Knee Surgery - Left x2, most recent 12/2014 Dr. Tonita Cong Pt does have hx of Post-op Nausea and Vomiting, did well after her most recent knee scope  Review of Systems General Not Present- Chills, Fatigue, Fever, Memory Loss, Night Sweats, Weight Gain and Weight Loss. Skin Not Present- Eczema, Hives, Itching, Lesions and Rash. HEENT Not Present- Dentures, Double Vision, Headache, Hearing Loss, Tinnitus and Visual Loss. Respiratory Not Present- Allergies, Chronic Cough, Coughing up blood, Shortness of breath at rest and Shortness of breath with exertion. Cardiovascular Not Present- Chest Pain, Difficulty Breathing Lying Down, Murmur, Palpitations, Racing/skipping heartbeats and Swelling. Gastrointestinal Not Present- Abdominal Pain, Bloody Stool, Constipation, Diarrhea, Difficulty Swallowing, Heartburn, Jaundice, Loss of appetitie, Nausea and Vomiting. Female Genitourinary Not Present- Blood in Urine, Discharge, Flank Pain, Incontinence, Painful Urination, Urgency, Urinary frequency, Urinary Retention, Urinating at Night and Weak urinary stream. Musculoskeletal Present- Back Pain, Joint Pain, Joint Stiffness, Joint Swelling, Morning Stiffness and Muscle Pain. Not Present- Muscle Weakness and  Spasms. Neurological Not Present- Blackout spells, Difficulty with balance, Dizziness, Paralysis, Tremor and Weakness. Psychiatric Not Present- Insomnia.  Physical Exam General Mental Status -Alert, cooperative and good historian. General Appearance-pleasant, Not in acute distress. Orientation-Oriented X3. Build & Nutrition-Well nourished and Well developed. Gait-Stiff and Antalgic.  Head and Neck Head-normocephalic, atraumatic . Neck Global Assessment - supple, no bruit auscultated on the right, no bruit auscultated on the left.  Eye Pupil - Bilateral-Regular and Round. Motion - Bilateral-EOMI.  Chest and Lung Exam Auscultation Breath sounds - clear at anterior chest wall and clear at posterior chest wall. Adventitious sounds - No Adventitious sounds.  Cardiovascular Auscultation Rhythm - Regular rate and rhythm. Heart Sounds - S1 WNL and S2 WNL. Murmurs & Other Heart Sounds - Auscultation of the heart reveals - No Murmurs.  Abdomen Palpation/Percussion Tenderness - Abdomen is non-tender to palpation. Rigidity (guarding) - Abdomen is soft. Auscultation Auscultation of the abdomen reveals - Bowel sounds normal.  Female Genitourinary Not done, not pertinent to present illness  Musculoskeletal Left Knee: Inspection and Palpation - Tenderness - medial joint line tender to palpation and lateral joint line tender to palpation, no tenderness to palpation of the superior calf, no tenderness to palpation of the quadriceps tendon, no tenderness to palpation of the patellar tendon, no tenderness to palpation of the fibular head, no tenderness to palpation of the peroneal nerve. Crepitus - mild patellofemoral crepitus. Patellar Tendon - no pain to palpation of the patellar tendon. Swelling - localized and mild. Tissue tension/texture is - soft. Pulses - 2+. Sensation - intact to light touch. Skin - Color - no ecchymosis, no erythema. Wound/Incision - Appearance - The  incision is well healed with no erythema or exudates. Swelling - there is no swelling around the incision. Healing - The incision is well healed and healing as expected. Strength and Tone - Quadriceps - 5/5. Hamstrings - 5/5. ROM: Flexion - AROM - 110 . Extension - AROM - 0 . Stability - Valgus Laxity at 30 - None. Valgus Laxity at 0 - None. Varus  Laxity at 30 - None. Varus Laxity at 0 - None. Lachman - Negative. Anterior Drawer Test - Negative. Posterior Drawer Test - Negative. Left Knee - Deformities/Malalignments/Discrepancies - no deformities noted. Special Tests - McMurray Test (lateral) - negative. McMurray Test (medial) - negative. Patellar Compression Pain - mild pain.  Imaging Standing xrays reviewed. Moderate medial and lateral narrowing noted, no significant collapse when compared with prior films from 06/2014. Prior lateral films show significant PF narrowing. Grade 4 changes noted arthroscopically 3 months ago.  Assessment & Plan Primary osteoarthritis of left knee (M17.12)  Pt with end-stage Left knee DJD, bone-on-bone, refractory to conservative tx, scheduled for Left total knee replacement by Dr. Tonita Cong on 03/22/15. We again discussed the procedure itself as well as risks, complications and alternatives, including but not limited to DVT, PE, infx, bleeding, failure of procedure, need for secondary procedure including manipulation, nerve injury, ongoing pain/symptoms, anesthesia risk, even stroke or death. Also discussed typical post-op protocols, activity restrictions, need for PT, flexion/extension exercises, time out of work. Discussed need for DVT ppx post-op with Xarelto then ASA per protocol. Discussed dental ppx. Also discussed limitations post-operatively such as kneeling and squatting. All questions were answered. Patient desires to proceed with surgery as scheduled. Will hold supplements accordingly, discussed her other medications as well. Will remain NPO after MN night before  surgery. Will present to [facility] for pre-op testing. Plan Xarelto 2 weeks post-op for DVT ppx then ASA. With her morphine allergy, will use dilaudid initially post-op. For D/C, plan Percocet for acute post-operative pain, Robaxin, Colace, may need Rx for Phenergan as well, she does occasionally take that now for nausea, she will eventually return to Dr. Nelva Bush for ongoing pain mgmt. Plan home with HHPT post-op with family members at home for assistance. Discussed returning to work from home, part time, with ability to ice and elevate, when ready, depending on her progress and amount of pain. Will follow up 10-14 days post-op for suture removal and xrays.  Plan left total knee replacement  Signed electronically by Lacie Draft, PA-C for Dr. Tonita Cong

## 2015-03-22 NOTE — Plan of Care (Signed)
Problem: Consults Goal: Diagnosis- Total Joint Replacement Primary Total Knee, Left     

## 2015-03-22 NOTE — Transfer of Care (Signed)
Immediate Anesthesia Transfer of Care Note  Patient: Traci Bird  Procedure(s) Performed: Procedure(s): LEFT TOTAL KNEE ARTHROPLASTY (Left)  Patient Location: PACU  Anesthesia Type:General  Level of Consciousness:  sedated, patient cooperative and responds to stimulation  Airway & Oxygen Therapy:Patient Spontanous Breathing and Patient connected to face mask oxgen  Post-op Assessment:  Report given to PACU RN and Post -op Vital signs reviewed and stable  Post vital signs:  Reviewed and stable  Last Vitals:  Filed Vitals:   03/22/15 0609  BP: 117/90  Pulse: 80  Temp: 36.7 C  Resp: 18    Complications: No apparent anesthesia complications

## 2015-03-23 LAB — BASIC METABOLIC PANEL
ANION GAP: 4 — AB (ref 5–15)
BUN: 8 mg/dL (ref 6–23)
CALCIUM: 8.7 mg/dL (ref 8.4–10.5)
CO2: 29 mmol/L (ref 19–32)
CREATININE: 0.56 mg/dL (ref 0.50–1.10)
Chloride: 106 mmol/L (ref 96–112)
GFR calc Af Amer: >90 mL/min (ref >90)
GFR calc non Af Amer: >90 mL/min (ref >90)
Glucose, Bld: 151 mg/dL — ABNORMAL HIGH (ref 70–99)
Potassium: 4.5 mmol/L (ref 3.5–5.1)
SODIUM: 139 mmol/L (ref 135–145)

## 2015-03-23 LAB — CBC
HEMATOCRIT: 29 % — AB (ref 36.0–46.0)
HEMOGLOBIN: 9.2 g/dL — AB (ref 12.0–15.0)
MCH: 27.8 pg (ref 26.0–34.0)
MCHC: 31.7 g/dL (ref 30.0–36.0)
MCV: 87.6 fL (ref 78.0–100.0)
Platelets: 171 10*3/uL (ref 150–400)
RBC: 3.31 MIL/uL — AB (ref 3.87–5.11)
RDW: 13.6 % (ref 11.5–15.5)
WBC: 9.4 10*3/uL (ref 4.0–10.5)

## 2015-03-23 MED ORDER — LEVOTHYROXINE SODIUM 75 MCG PO TABS
75.0000 ug | ORAL_TABLET | Freq: Every day | ORAL | Status: DC
  Administered 2015-03-23 – 2015-03-24 (×2): 75 ug via ORAL
  Filled 2015-03-23 (×2): qty 1

## 2015-03-23 MED ORDER — OXYCODONE HCL 5 MG PO TABS
5.0000 mg | ORAL_TABLET | ORAL | Status: DC | PRN
Start: 2015-03-23 — End: 2015-03-25
  Administered 2015-03-23 (×2): 15 mg via ORAL
  Administered 2015-03-23: 5 mg via ORAL
  Filled 2015-03-23: qty 3
  Filled 2015-03-23: qty 1
  Filled 2015-03-23: qty 3

## 2015-03-23 MED ORDER — SODIUM CHLORIDE 0.9 % IV BOLUS (SEPSIS)
500.0000 mL | Freq: Once | INTRAVENOUS | Status: AC
  Administered 2015-03-23: 500 mL via INTRAVENOUS

## 2015-03-23 MED ORDER — CYANOCOBALAMIN 250 MCG PO TABS
250.0000 ug | ORAL_TABLET | Freq: Every day | ORAL | Status: DC
  Administered 2015-03-23 – 2015-03-25 (×3): 250 ug via ORAL
  Filled 2015-03-23 (×2): qty 1

## 2015-03-23 MED ORDER — HYDROMORPHONE HCL 2 MG PO TABS
2.0000 mg | ORAL_TABLET | ORAL | Status: DC | PRN
  Administered 2015-03-23 – 2015-03-25 (×9): 2 mg via ORAL
  Filled 2015-03-23 (×9): qty 1

## 2015-03-23 NOTE — Progress Notes (Signed)
Patient ID: Ahmed Prima, female   DOB: 12-Feb-1961, 54 y.o.   MRN: [id number] Subjective: 1 Day Post-Op Procedure(s) (LRB): LEFT TOTAL KNEE ARTHROPLASTY (Left) Patient reports pain as moderate.    Patient has complaints of L knee and leg pain, muscle cramps/soreness. No other c/o this AM. Foley just removed.  We will start therapy today. Plan is to go home with HHPT after hospital stay.  Objective: Vital signs in last 24 hours: Temp:  [98.3 F (36.8 C)-98.9 F (37.2 C)] 98.8 F (37.1 C) (04/15 3818) Pulse Rate:  [81-104] 82 (04/15 0633) Resp:  [12-17] 16 (04/15 0633) BP: (99-124)/(54-75) 99/54 mmHg (04/15 0633) SpO2:  [97 %-100 %] 100 % (04/15 0633)  Intake/Output from previous day:  Intake/Output Summary (Last 24 hours) at 03/23/15 0730 Last data filed at 03/23/15 0609  Gross per 24 hour  Intake 4323.75 ml  Output   3575 ml  Net 748.75 ml    Intake/Output this shift:    Labs: Results for orders placed or performed during the hospital encounter of 03/22/15  CBC  Result Value Ref Range   WBC 9.4 4.0 - 10.5 K/uL   RBC 3.31 (L) 3.87 - 5.11 MIL/uL   Hemoglobin 9.2 (L) 12.0 - 15.0 g/dL   HCT 29.0 (L) 36.0 - 46.0 %   MCV 87.6 78.0 - 100.0 fL   MCH 27.8 26.0 - 34.0 pg   MCHC 31.7 30.0 - 36.0 g/dL   RDW 13.6 11.5 - 15.5 %   Platelets 171 150 - 400 K/uL  Basic metabolic panel  Result Value Ref Range   Sodium 139 135 - 145 mmol/L   Potassium 4.5 3.5 - 5.1 mmol/L   Chloride 106 96 - 112 mmol/L   CO2 29 19 - 32 mmol/L   Glucose, Bld 151 (H) 70 - 99 mg/dL   BUN 8 6 - 23 mg/dL   Creatinine, Ser 0.56 0.50 - 1.10 mg/dL   Calcium 8.7 8.4 - 10.5 mg/dL   GFR calc non Af Amer >90 >90 mL/min   GFR calc Af Amer >90 >90 mL/min   Anion gap 4 (L) 5 - 15  Type and screen  Result Value Ref Range   ABO/RH(D) A POS    Antibody Screen NEG    Sample Expiration 03/25/2015   ABO/Rh  Result Value Ref Range   ABO/RH(D) A POS     Exam - Neurologically intact ABD  soft Neurovascular intact Sensation intact distally Intact pulses distally Dorsiflexion/Plantar flexion intact Incision: dressing C/D/I and no drainage No cellulitis present Compartment soft diffuse lower leg tenderness, negative homans Dressing - clean, dry, no drainage Motor function intact - moving foot and toes well on exam.  ACE bandage removed from lower leg  Assessment/Plan: 1 Day Post-Op Procedure(s) (LRB): LEFT TOTAL KNEE ARTHROPLASTY (Left)  Advance diet Up with therapy D/C IV fluids Past Medical History  Diagnosis Date  . Hypothyroidism   . Chondromalacia of left knee   . Acute meniscal tear of knee LEFT  . Arthritis HIPS , KNEES, HANDS  . History of diabetes mellitus NO ISSUE SINCE GASTRIC BYPASS  . History of sleep apnea NO ISSUES SINCE GASTRIC BYPASS  . Depression   . Anxiety     DVT Prophylaxis - Xarelto Protocol Weight-Bearing as tolerated to Left leg No vaccines Up with PT today Plan eventual D/C home with HHPT when ready, tomorrow if pain well controlled and progressing well with PT, otherwise Sunday Will discuss with Dr. Mliss Fritz,  JACLYN M. 03/23/2015, 7:30 AM

## 2015-03-23 NOTE — Progress Notes (Signed)
CSW consulted for SNF placement. PN reviewed and spoke  with PA. Pt plans to return home with HHPT following hospital d/c. RNCM will assist with d/c planning.   Werner Lean LCSW [phone]

## 2015-03-23 NOTE — Progress Notes (Signed)
OT Cancellation Note  Patient Details Name: Traci Bird MRN: [id number] DOB: [birth year]   Cancelled Treatment:    Reason Eval/Treat Not Completed: Other (comment).  Pt limited by nausea, dizziness and decreased BP with PT.  Will check back tomorrow.  [name] 03/23/2015, 12:55 PM  Lesle Chris, OTR/L [phone] 03/23/2015

## 2015-03-23 NOTE — Evaluation (Signed)
Occupational Therapy Evaluation Patient Details Name: Traci Bird MRN: [id number] DOB: 11/17/1961 Today's Date: 03/23/2015    History of Present Illness L TKR   Clinical Impression   This 54 year old female was admitted for the above surgery.  She will benefit from skilled OT to continue to educate her and family on safe bathroom transfers.  Will also review AE, if pt is interested in this; husband is available to assist her.  Pt was independent with bathroom transfers prior to admission. She currently needs mod A for basic transfers.  Goals for toilet and shower are set for min A level.    Follow Up Recommendations    supervision, 24/7   Equipment Recommendations    3:1 commode   Recommendations for Other Services       Precautions / Restrictions Precautions Precautions: Fall;Knee Required Braces or Orthoses: Knee Immobilizer - Left Knee Immobilizer - Left: Discontinue once straight leg raise with < 10 degree lag Restrictions Weight Bearing Restrictions: No      Mobility Bed Mobility           Sit to supine: Min assist   General bed mobility comments: assist for LLE  Transfers   Equipment used: Rolling walker (2 wheeled) Transfers: Sit to/from Stand Sit to Stand: Mod assist         General transfer comment: cues for UE/LE placement    Balance                                            ADL Overall ADL's : Needs assistance/impaired     Grooming: Set up;Sitting   Upper Body Bathing: Set up;Sitting   Lower Body Bathing: Moderate assistance;Sit to/from stand   Upper Body Dressing : Minimal assistance;Sitting (iv)   Lower Body Dressing: Maximal assistance;Sit to/from stand   Toilet Transfer: Stand-pivot;Moderate assistance (to bed)             General ADL Comments: Pt will have husband's assist at home.  Asked if she would like to see AE, but she is undecided.  Will check back next visit.  Pt was limited by pain this  session, but it did decrease during session (from 9 1/2 to 8).       Vision     Perception     Praxis      Pertinent Vitals/Pain Pain Score: 8  Pain Location: L knee/calf Pain Descriptors / Indicators: Aching Pain Intervention(s): Limited activity within patient's tolerance;Monitored during session;Premedicated before session;Repositioned;Ice applied     Hand Dominance     Extremity/Trunk Assessment Upper Extremity Assessment Upper Extremity Assessment: Overall WFL for tasks assessed           Communication Communication Communication: No difficulties   Cognition Arousal/Alertness: Awake/alert Behavior During Therapy: WFL for tasks assessed/performed Overall Cognitive Status: Within Functional Limits for tasks assessed                     General Comments       Exercises       Shoulder Instructions      Home Living Family/patient expects to be discharged to:: Private residence Living Arrangements: Spouse/significant other Available Help at Discharge: Family               Bathroom Shower/Tub: Walk-in Psychologist, prison and probation services: Standard     Home Equipment:  (  shower stool)          Prior Functioning/Environment Level of Independence: Independent             OT Diagnosis: Acute pain;Generalized weakness   OT Problem List: Decreased strength;Decreased activity tolerance;Decreased knowledge of use of DME or AE;Pain   OT Treatment/Interventions: Self-care/ADL training;DME and/or AE instruction;Patient/family education    OT Goals(Current goals can be found in the care plan section) ADL Goals Pt Will Transfer to Toilet: with min assist;ambulating;bedside commode Pt Will Perform Toileting - Clothing Manipulation and hygiene: with min assist;sit to/from stand Pt Will Perform Tub/Shower Transfer: with min assist;Shower transfer;3 in 1;ambulating Additional ADL Goal #1: pt will verbalize use of AE (vs demonstrate with supervision if she is  interested in using this)  OT Frequency: Min 2X/week   Barriers to D/C:            Co-evaluation PT/OT/SLP Co-Evaluation/Treatment: Yes Reason for Co-Treatment: For patient/therapist safety (was orthostatic this am) PT goals addressed during session: Mobility/safety with mobility OT goals addressed during session: ADL's and self-care      End of Session    Activity Tolerance: Patient limited by fatigue Patient left: in bed;with call bell/phone within reach;with family/visitor present   Time:1520  - 1538  18 minutes Charges:   1 ?Visit 1 Eval 1 G-Codes:    Martine Trageser 04-02-15, 3:55 PM   Lesle Chris, OTR/L [phone] 04/02/2015

## 2015-03-23 NOTE — Progress Notes (Signed)
Physical Therapy Treatment Patient Details Name: Traci Bird MRN: [id number] DOB: 11-10-1961 Today's Date: 03/23/2015    History of Present Illness L TKR    PT Comments    Pt progressing slowly 2* c/o L LE pain.  Follow Up Recommendations  Home health PT     Equipment Recommendations  None recommended by PT    Recommendations for Other Services OT consult     Precautions / Restrictions Precautions Precautions: Fall;Knee Required Braces or Orthoses: Knee Immobilizer - Left Knee Immobilizer - Left: Discontinue once straight leg raise with < 10 degree lag Restrictions Weight Bearing Restrictions: No    Mobility  Bed Mobility Overal bed mobility: Needs Assistance Bed Mobility: Sit to Supine       Sit to supine: Min assist;Mod assist   General bed mobility comments: assist for LLE  Transfers Overall transfer level: Needs assistance Equipment used: Rolling walker (2 wheeled) Transfers: Sit to/from Stand Sit to Stand: Mod assist         General transfer comment: cues for UE/LE placement  Ambulation/Gait Ambulation/Gait assistance: Mod assist;+2 safety/equipment Ambulation Distance (Feet): 6 Feet Assistive device: Rolling walker (2 wheeled) Gait Pattern/deviations: Step-to pattern;Decreased step length - right;Decreased step length - left;Shuffle;Trunk flexed Gait velocity: decr   General Gait Details: cues for sequence, posture and position from RW - pt tolerating ltd WB on L 2* calf pain.   Stairs            Wheelchair Mobility    Modified Rankin (Stroke Patients Only)       Balance                                    Cognition Arousal/Alertness: Awake/alert Behavior During Therapy: WFL for tasks assessed/performed Overall Cognitive Status: Within Functional Limits for tasks assessed                      Exercises      General Comments        Pertinent Vitals/Pain Pain Assessment: 0-10 Pain Score: 9   Pain Location: L knee, thigh and calf Pain Descriptors / Indicators: Aching;Burning;Sore Pain Intervention(s): Premedicated before session;Monitored during session;Limited activity within patient's tolerance;Ice applied    Home Living Family/patient expects to be discharged to:: Private residence Living Arrangements: Spouse/significant other Available Help at Discharge: Family         Home Equipment:  (shower stool)      Prior Function Level of Independence: Independent          PT Goals (current goals can now be found in the care plan section) Acute Rehab PT Goals Patient Stated Goal: Resume previous lifestyle with decreased pain PT Goal Formulation: With patient Time For Goal Achievement: 03/30/15 Potential to Achieve Goals: Good Progress towards PT goals: Progressing toward goals    Frequency  7X/week    PT Plan Current plan remains appropriate    Co-evaluation PT/OT/SLP Co-Evaluation/Treatment: Yes Reason for Co-Treatment: For patient/therapist safety PT goals addressed during session: Mobility/safety with mobility OT goals addressed during session: ADL's and self-care     End of Session Equipment Utilized During Treatment: Gait belt;Left knee immobilizer Activity Tolerance: Patient limited by pain Patient left: in bed;with call bell/phone within reach;with family/visitor present     Time: 1524-1540 PT Time Calculation (min) (ACUTE ONLY): 16 min  Charges:  $Gait Training: 8-22 mins  G Codes:      Malvern Kadlec 04-01-2015, 4:59 PM

## 2015-03-23 NOTE — Evaluation (Signed)
Physical Therapy Evaluation Patient Details Name: Traci Bird MRN: [id number] DOB: 11-16-1961 Today's Date: 03/23/2015   History of Present Illness  L TKR  Clinical Impression  Pt s/p L TKR presents with decreased L LE strength/ROM and post op pain limiting functional mobility.  Pt should progress to dc home with family assist and HHPT follow up.    Follow Up Recommendations Home health PT    Equipment Recommendations  None recommended by PT    Recommendations for Other Services OT consult     Precautions / Restrictions Precautions Precautions: Fall;Knee Required Braces or Orthoses: Knee Immobilizer - Left Knee Immobilizer - Left: Discontinue once straight leg raise with < 10 degree lag Restrictions Weight Bearing Restrictions: No      Mobility  Bed Mobility Overal bed mobility: Needs Assistance Bed Mobility: Supine to Sit     Supine to sit: Min assist;Mod assist     General bed mobility comments: cues for sequence and use of R LE to self assist  Transfers Overall transfer level: Needs assistance Equipment used: Rolling walker (2 wheeled) Transfers: Sit to/from Stand Sit to Stand: Mod assist;+2 physical assistance;+2 safety/equipment         General transfer comment: cues for LE management and use of UEs to self assist  Ambulation/Gait Ambulation/Gait assistance: Mod assist;+2 physical assistance;+2 safety/equipment Ambulation Distance (Feet): 4 Feet Assistive device: Rolling walker (2 wheeled) Gait Pattern/deviations: Step-to pattern;Decreased step length - right;Decreased step length - left;Shuffle;Trunk flexed Gait velocity: decr   General Gait Details: cues for sequence, posture and position from RW - pt tolerating min WB on L 2* calf pain.  Stairs            Wheelchair Mobility    Modified Rankin (Stroke Patients Only)       Balance                                             Pertinent Vitals/Pain Pain  Assessment: 0-10 Pain Score: 8  Pain Location: L calf with WB - RN aware Pain Descriptors / Indicators: Grimacing;Sore Pain Intervention(s): Limited activity within patient's tolerance;Monitored during session;Premedicated before session;Ice applied    Home Living Family/patient expects to be discharged to:: Private residence Living Arrangements: Spouse/significant other Available Help at Discharge: Family Type of Home: House Home Access: Stairs to enter Entrance Stairs-Rails: None Entrance Stairs-Number of Steps: 2+1 Home Layout: One level Home Equipment: Environmental consultant - 2 wheels;Walker - standard;Crutches      Prior Function Level of Independence: Independent               Hand Dominance   Dominant Hand: Right    Extremity/Trunk Assessment   Upper Extremity Assessment: Overall WFL for tasks assessed           Lower Extremity Assessment: LLE deficits/detail   LLE Deficits / Details: 2/5 quads with AAROM at knee -10 - 35  Cervical / Trunk Assessment: Normal  Communication   Communication: No difficulties  Cognition Arousal/Alertness: Awake/alert Behavior During Therapy: WFL for tasks assessed/performed Overall Cognitive Status: Within Functional Limits for tasks assessed                      General Comments      Exercises Total Joint Exercises Ankle Circles/Pumps: AROM;Both;15 reps;Supine Quad Sets: AROM;Both;10 reps;Supine Heel Slides: AAROM;Left;10 reps;Supine Straight Leg Raises: AAROM;Left;10 reps;Supine  Assessment/Plan    PT Assessment Patient needs continued PT services  PT Diagnosis Difficulty walking   PT Problem List Decreased strength;Decreased range of motion;Decreased activity tolerance;Decreased mobility;Pain;Decreased knowledge of use of DME  PT Treatment Interventions DME instruction;Gait training;Stair training;Functional mobility training;Therapeutic activities;Therapeutic exercise;Patient/family education   PT Goals  (Current goals can be found in the Care Plan section) Acute Rehab PT Goals Patient Stated Goal: Resume previous lifestyle with decreased pain PT Goal Formulation: With patient Time For Goal Achievement: 03/30/15 Potential to Achieve Goals: Good    Frequency 7X/week   Barriers to discharge        Co-evaluation               End of Session Equipment Utilized During Treatment: Gait belt Activity Tolerance: Patient limited by fatigue;Patient limited by pain;Other (comment) (nausea and dizziness - BP 79/49) Patient left: in chair;with call bell/phone within reach;with family/visitor present Nurse Communication: Mobility status;Other (comment) (BP with standing)         Time: 1020-1052 PT Time Calculation (min) (ACUTE ONLY): 32 min   Charges:   PT Evaluation $Initial PT Evaluation Tier I: 1 Procedure PT Treatments $Therapeutic Exercise: 8-22 mins   PT G Codes:        Edyn Qazi 2015-03-31, 12:28 PM

## 2015-03-23 NOTE — Discharge Instructions (Signed)
Elevate leg above heart 6x a day for 20minutes each °Use knee immobilizer while walking until can SLR x 10 °Use knee immobilizer in bed to keep knee in extension °Aquacel dressing may remain in place until follow up. May shower with aquacel dressing in place. If the dressing becomes saturated or peels off, you may remove aquacel dressing. Do not remove steri-strips if they are present. Place new dressing with gauze and tape or ACE bandage which should be kept clean and dry and changed daily. ° °======================================================================================== ° °Information on my medicine - XARELTO® (Rivaroxaban) ° °This medication education was reviewed with me or my healthcare representative as part of my discharge preparation.  The pharmacist that spoke with me during my hospital stay was:  Keenya Matera K, RPH ° °Why was Xarelto® prescribed for you? °Xarelto® was prescribed for you to reduce the risk of blood clots forming after orthopedic surgery. The medical term for these abnormal blood clots is venous thromboembolism (VTE). ° °What do you need to know about xarelto® ? °Take your Xarelto® ONCE DAILY at the same time every day. °You may take it either with or without food. ° °If you have difficulty swallowing the tablet whole, you may crush it and mix in applesauce just prior to taking your dose. ° °Take Xarelto® exactly as prescribed by your doctor and DO NOT stop taking Xarelto® without talking to the doctor who prescribed the medication.  Stopping without other VTE prevention medication to take the place of Xarelto® may increase your risk of developing a clot. ° °After discharge, you should have regular check-up appointments with your healthcare provider that is prescribing your Xarelto®.   ° °What do you do if you miss a dose? °If you miss a dose, take it as soon as you remember on the same day then continue your regularly scheduled once daily regimen the next day. Do not take two  doses of Xarelto® on the same day.  ° °Important Safety Information °A possible side effect of Xarelto® is bleeding. You should call your healthcare provider right away if you experience any of the following: °? Bleeding from an injury or your nose that does not stop. °? Unusual colored urine (red or dark brown) or unusual colored stools (red or black). °? Unusual bruising for unknown reasons. °? A serious fall or if you hit your head (even if there is no bleeding). ° °Some medicines may interact with Xarelto® and might increase your risk of bleeding while on Xarelto®. To help avoid this, consult your healthcare provider or pharmacist prior to using any new prescription or non-prescription medications, including herbals, vitamins, non-steroidal anti-inflammatory drugs (NSAIDs) and supplements. ° °This website has more information on Xarelto®: www.xarelto.com. ° ° °

## 2015-03-24 DIAGNOSIS — Z96652 Presence of left artificial knee joint: Secondary | ICD-10-CM

## 2015-03-24 LAB — BASIC METABOLIC PANEL
Anion gap: 4 — ABNORMAL LOW (ref 5–15)
BUN: 8 mg/dL (ref 6–23)
CHLORIDE: 105 mmol/L (ref 96–112)
CO2: 30 mmol/L (ref 19–32)
Calcium: 8.2 mg/dL — ABNORMAL LOW (ref 8.4–10.5)
Creatinine, Ser: 0.56 mg/dL (ref 0.50–1.10)
GFR calc non Af Amer: >90 mL/min (ref >90)
Glucose, Bld: 121 mg/dL — ABNORMAL HIGH (ref 70–99)
Potassium: 3.8 mmol/L (ref 3.5–5.1)
Sodium: 139 mmol/L (ref 135–145)

## 2015-03-24 LAB — CBC
HCT: 26.1 % — ABNORMAL LOW (ref 36.0–46.0)
Hemoglobin: 8.1 g/dL — ABNORMAL LOW (ref 12.0–15.0)
MCH: 27.3 pg (ref 26.0–34.0)
MCHC: 31 g/dL (ref 30.0–36.0)
MCV: 87.9 fL (ref 78.0–100.0)
PLATELETS: 153 10*3/uL (ref 150–400)
RBC: 2.97 MIL/uL — ABNORMAL LOW (ref 3.87–5.11)
RDW: 13.7 % (ref 11.5–15.5)
WBC: 7.4 10*3/uL (ref 4.0–10.5)

## 2015-03-24 NOTE — Progress Notes (Signed)
Occupational Therapy Treatment Patient Details Name: FERRIS TALLY MRN: [id number] DOB: [birth year] Today's Date: 03/24/2015    History of present illness L TKR   OT comments  Patient with pain limiting ability to participate fully in OT today. Will follow up with patient tomorrow to practice shower and toilet transfers.   Follow Up Recommendations  No OT follow up;Supervision/Assistance - 24 hour    Equipment Recommendations  3 in 1 bedside comode    Recommendations for Other Services      Precautions / Restrictions Precautions Precautions: Fall;Knee Required Braces or Orthoses: Knee Immobilizer - Left Knee Immobilizer - Left: Discontinue once straight leg raise with < 10 degree lag Restrictions Weight Bearing Restrictions: No       Mobility Bed Mobility                  Transfers                      Balance                                   ADL                                         General ADL Comments: Patient up in recliner. Reports she has been up to [facility] a couple of times this morning with assistance from husband and nursing staff. Educated patient on role of OT and plan of care to address shower and toilet transfers. Patient reports she has increased pain in her L leg and wanted to hold off. Began verbal education of shower transfer technique. Patient and husband agreeable to practice tomorrow. Will follow up with them tomorrow.      Vision                     Perception     Praxis      Cognition   Behavior During Therapy: WFL for tasks assessed/performed Overall Cognitive Status: Within Functional Limits for tasks assessed                       Extremity/Trunk Assessment               Exercises     Shoulder Instructions       General Comments      Pertinent Vitals/ Pain       Pain Assessment: 0-10 Pain Score: 10-Worst pain ever Pain Location: L knee, thigh,  calf Pain Descriptors / Indicators: Aching;Sore Pain Intervention(s): Other (comment) (Patient has requested pain meds from RN)  Home Living                                          Prior Functioning/Environment              Frequency Min 2X/week     Progress Toward Goals  OT Goals(current goals can now be found in the care plan section)        Plan  No follow-up OT at discharge; Supervision/Assistance as needed by husband           Activity Tolerance   Pain limited treatment session  Patient Left  in recliner, husband present             Time: 1122-1130 OT Time Calculation (min): 8 min  Charges: OT General Charges $OT Visit: 1 Procedure OT Treatments $Self Care/Home Management : 8-22 mins  Tonea Leiphart A 03/24/2015, 11:58 AM

## 2015-03-24 NOTE — Progress Notes (Signed)
   Subjective: 2 Days Post-Op Procedure(s) (LRB): LEFT TOTAL KNEE ARTHROPLASTY (Left)  Pt c/o moderate soreness in the left knee Has problems activating the quads  Hopeful d/c tomorrow Denies any new symptoms or issues Patient reports pain as moderate.  Objective:   VITALS:   Filed Vitals:   03/23/15 1900  BP: 120/58  Pulse: 80  Temp: 99.6 F (37.6 C)  Resp: 16    Left knee aquacel dressing in place nv intact distally No rashes or edema  LABS  Recent Labs  03/23/15 0505 03/24/15 0507  HGB 9.2* 8.1*  HCT 29.0* 26.1*  WBC 9.4 7.4  PLT 171 153     Recent Labs  03/23/15 0505 03/24/15 0507  NA 139 139  K 4.5 3.8  BUN 8 8  CREATININE 0.56 0.56  GLUCOSE 151* 121*     Assessment/Plan: 2 Days Post-Op Procedure(s) (LRB): LEFT TOTAL KNEE ARTHROPLASTY (Left) Acute blood loss anemia - currently pt asymptomatic and will continue to monitor Discussed results with the pt and discussed transfusion if she drops any further Pt in agreement Continue PT/OT Pain management Hopeful d/c tomorrow   Merla Riches, MPAS, PA-C  03/24/2015, 7:49 AM

## 2015-03-24 NOTE — Progress Notes (Signed)
Physical Therapy Treatment Patient Details Name: Traci Bird MRN: [id number] DOB: 1961/07/17 Today's Date: 03/24/2015    History of Present Illness L TKR    PT Comments    Pt cooperative but requiring ++encouragement to WB on L LE.  Pt ambulated 25' with RW, assist and multiple short standing rests but c/o dizziness - BP 96/64 - RN aware.  Follow Up Recommendations  Home health PT     Equipment Recommendations  None recommended by PT    Recommendations for Other Services OT consult     Precautions / Restrictions Precautions Precautions: Fall;Knee Required Braces or Orthoses: Knee Immobilizer - Left Knee Immobilizer - Left: Discontinue once straight leg raise with < 10 degree lag Restrictions Weight Bearing Restrictions: No Other Position/Activity Restrictions: WBAT    Mobility  Bed Mobility Overal bed mobility: Needs Assistance Bed Mobility: Supine to Sit;Sit to Supine     Supine to sit: Min assist;Mod assist Sit to supine: Min assist   General bed mobility comments: assist for LLE  Transfers Overall transfer level: Needs assistance Equipment used: Rolling walker (2 wheeled) Transfers: Sit to/from Stand Sit to Stand: Min assist;Mod assist Stand pivot transfers: Min assist;Mod assist       General transfer comment: cues for UE/LE placement  Ambulation/Gait Ambulation/Gait assistance: +2 safety/equipment;Min assist;Mod assist Ambulation Distance (Feet): 25 Feet (and 5' from [facility] [address]) Assistive device: Rolling walker (2 wheeled) Gait Pattern/deviations: Step-to pattern;Decreased step length - right;Decreased step length - left;Shuffle;Trunk flexed Gait velocity: decr   General Gait Details: cues for sequence, stride length, increased heel contact and WB on L, posture and position from [employer]            Wheelchair Mobility    Modified Rankin (Stroke Patients Only)       Balance                                     Cognition Arousal/Alertness: Awake/alert Behavior During Therapy: WFL for tasks assessed/performed Overall Cognitive Status: Within Functional Limits for tasks assessed                      Exercises      General Comments        Pertinent Vitals/Pain Pain Assessment: 0-10 Pain Score: 8  Pain Location: L knee and calf Pain Descriptors / Indicators: Aching;Sore Pain Intervention(s): Limited activity within patient's tolerance;Monitored during session;Premedicated before session;Ice applied    Home Living                      Prior Function            PT Goals (current goals can now be found in the care plan section) Acute Rehab PT Goals Patient Stated Goal: Resume previous lifestyle with decreased pain PT Goal Formulation: With patient Time For Goal Achievement: 03/30/15 Potential to Achieve Goals: Good Progress towards PT goals: Progressing toward goals    Frequency  7X/week    PT Plan Current plan remains appropriate    Co-evaluation             End of Session Equipment Utilized During Treatment: Gait belt;Left knee immobilizer Activity Tolerance: Patient limited by pain Patient left: in bed;with call bell/phone within reach;with family/visitor present     Time: 5681-2751 PT Time Calculation (min) (ACUTE ONLY): 31 min  Charges:  $Gait Training: 8-22 mins $  Therapeutic Activity: 8-22 mins                    G Codes:      Dashanti Burr 04-22-15, 5:20 PM

## 2015-03-24 NOTE — Progress Notes (Signed)
VASCULAR LAB PRELIMINARY  PRELIMINARY  PRELIMINARY  PRELIMINARY  Left lower extremity venous duplex completed.    Preliminary report:  Left:  No evidence of DVT, superficial thrombosis, or Baker's cyst.  Jaimen Melone, RVS 03/24/2015, 3:23 PM

## 2015-03-24 NOTE — Progress Notes (Signed)
**Note De-Identified Traci Obfuscation** Physical Therapy Treatment Patient Details Name: Traci Bird MRN: [id number] DOB: 12/26/1960 Today's Date: 03/24/2015    History of Present Illness L TKR    PT Comments    Pt cooperative but ltd by inability to tolerate WB on L LE 2* calf pain.  RN aware  Follow Up Recommendations  Home health PT     Equipment Recommendations  None recommended by PT    Recommendations for Other Services OT consult     Precautions / Restrictions Precautions Precautions: Fall;Knee Required Braces or Orthoses: Knee Immobilizer - Left Knee Immobilizer - Left: Discontinue once straight leg raise with < 10 degree lag Restrictions Weight Bearing Restrictions: No    Mobility  Bed Mobility Overal bed mobility: Needs Assistance Bed Mobility: Sit to Supine       Sit to supine: Min assist   General bed mobility comments: assist for LLE  Transfers Overall transfer level: Needs assistance Equipment used: Rolling walker (2 wheeled) Transfers: Sit to/from Stand Sit to Stand: Mod assist         General transfer comment: cues for UE/LE placement  Ambulation/Gait Ambulation/Gait assistance: +2 physical assistance;+2 safety/equipment;Mod assist Ambulation Distance (Feet): 6 Feet Assistive device: Rolling walker (2 wheeled) Gait Pattern/deviations: Step-to pattern;Decreased step length - left;Decreased step length - right;Shuffle;Trunk flexed Gait velocity: decr   General Gait Details: cues for sequence, posture and position from RW - pt tolerating ltd WB on L 2* calf pain.   Stairs            Wheelchair Mobility    Modified Rankin (Stroke Patients Only)       Balance                                    Cognition Arousal/Alertness: Awake/alert Behavior During Therapy: WFL for tasks assessed/performed Overall Cognitive Status: Within Functional Limits for tasks assessed                      Exercises Total Joint Exercises Ankle  Circles/Pumps: AROM;Both;15 reps;Supine Quad Sets: AROM;Both;Supine;15 reps Heel Slides: AAROM;Left;Supine;15 reps Straight Leg Raises: AAROM;Left;Supine;15 reps Goniometric ROM: AAROM at L knee -12 - 40    General Comments        Pertinent Vitals/Pain Pain Assessment: 0-10 Pain Score: 8  Pain Location: L knee and calf Pain Descriptors / Indicators: Aching;Sore Pain Intervention(s): Limited activity within patient's tolerance;Monitored during session;Premedicated before session;Ice applied    Home Living                      Prior Function            PT Goals (current goals can now be found in the care plan section) Acute Rehab PT Goals Patient Stated Goal: Resume previous lifestyle with decreased pain PT Goal Formulation: With patient Time For Goal Achievement: 03/30/15 Potential to Achieve Goals: Good Progress towards PT goals: Not progressing toward goals - comment (WB on L ltd by calf pain - RN aware)    Frequency  7X/week    PT Plan Current plan remains appropriate    Co-evaluation             End of Session Equipment Utilized During Treatment: Gait belt;Left knee immobilizer Activity Tolerance: Patient limited by pain Patient left: in bed;with call bell/phone within reach;with family/visitor present     Time: 5361-4431 PT Time Calculation (min) (ACUTE ONLY):  26 min  Charges:  $Gait Training: 8-22 mins $Therapeutic Exercise: 8-22 mins                    G Codes:      Traci Bird 15-Apr-2015, 3:13 PM

## 2015-03-25 LAB — BASIC METABOLIC PANEL
Anion gap: 6 (ref 5–15)
BUN: 9 mg/dL (ref 6–23)
CALCIUM: 8.7 mg/dL (ref 8.4–10.5)
CO2: 28 mmol/L (ref 19–32)
CREATININE: 0.55 mg/dL (ref 0.50–1.10)
Chloride: 103 mmol/L (ref 96–112)
GFR calc Af Amer: >90 mL/min (ref >90)
GFR calc non Af Amer: >90 mL/min (ref >90)
Glucose, Bld: 120 mg/dL — ABNORMAL HIGH (ref 70–99)
Potassium: 4.2 mmol/L (ref 3.5–5.1)
Sodium: 137 mmol/L (ref 135–145)

## 2015-03-25 LAB — CBC
HCT: 27 % — ABNORMAL LOW (ref 36.0–46.0)
Hemoglobin: 8.5 g/dL — ABNORMAL LOW (ref 12.0–15.0)
MCH: 27.6 pg (ref 26.0–34.0)
MCHC: 31.5 g/dL (ref 30.0–36.0)
MCV: 87.7 fL (ref 78.0–100.0)
PLATELETS: 175 10*3/uL (ref 150–400)
RBC: 3.08 MIL/uL — AB (ref 3.87–5.11)
RDW: 13.3 % (ref 11.5–15.5)
WBC: 9 10*3/uL (ref 4.0–10.5)

## 2015-03-25 NOTE — Progress Notes (Signed)
   Subjective: 3 Days Post-Op Procedure(s) (LRB): LEFT TOTAL KNEE ARTHROPLASTY (Left)  Pt c/o pain to calf with weight bearing Therapy exercise have gone well Ready for d/c home later today Patient reports pain as moderate.  Objective:   VITALS:   Filed Vitals:   03/25/15 0541  BP:   Pulse:   Temp: 98.8 F (37.1 C)  Resp:     Left knee incision healing well nv intact distally No rashes or edema Good rom of left knee and ankle  LABS  Recent Labs  03/23/15 0505 03/24/15 0507 03/25/15 0543  HGB 9.2* 8.1* 8.5*  HCT 29.0* 26.1* 27.0*  WBC 9.4 7.4 9.0  PLT 171 153 175     Recent Labs  03/23/15 0505 03/24/15 0507 03/25/15 0543  NA 139 139 137  K 4.5 3.8 4.2  BUN 8 8 9   CREATININE 0.56 0.56 0.55  GLUCOSE 151* 121* 120*     Assessment/Plan: 3 Days Post-Op Procedure(s) (LRB): LEFT TOTAL KNEE ARTHROPLASTY (Left) D/c home today Continue with PT/OT Pain management F/u in 2 weeks    Merla Riches, MPAS, PA-C  03/25/2015, 7:45 AM

## 2015-03-25 NOTE — Progress Notes (Signed)
Physical Therapy Treatment Patient Details Name: ERMINIA MCNEW MRN: [id number] DOB: 1961/12/05 Today's Date: 03/25/2015    History of Present Illness L TKR    PT Comments    POD # 3 am session.  Applied KI and instructed on use for amb and stairs as pt was unable to perform active SLR.  Had spouse assist pt with transfers and amb with RW.  Instructed on safe handling and level od assist.  Performed one step forward and 2 steps backward due to nio rails with spouse.  Instructed on proper sequencing and handout given.  Attempted TE's however pain level was 8/10 with much grimacing.  RN called for pain meds and will return a little later to complete and educated on TKR TE's HEP.  ICE applied.   Follow Up Recommendations  Home health PT     Equipment Recommendations  None recommended by PT    Recommendations for Other Services       Precautions / Restrictions Precautions Precautions: Fall;Knee Precaution Comments: instructed on KI use for amb and stairs Required Braces or Orthoses: Knee Immobilizer - Left Knee Immobilizer - Left: Discontinue once straight leg raise with < 10 degree lag Restrictions Weight Bearing Restrictions: No Other Position/Activity Restrictions: WBAT    Mobility  Bed Mobility               General bed mobility comments: Pt OOB in recliner  Transfers Overall transfer level: Needs assistance Equipment used: Rolling walker (2 wheeled) Transfers: Sit to/from Stand Sit to Stand: Min guard         General transfer comment: 25% VC's on proper hand placement and increased time  Ambulation/Gait Ambulation/Gait assistance: Min guard Ambulation Distance (Feet): 28 Feet Assistive device: Rolling walker (2 wheeled) Gait Pattern/deviations: Step-to pattern;Decreased stance time - left Gait velocity: decreased   General Gait Details: cues for sequence, stride length, increased heel contact and WB on L, posture and position from [employer] Stairs: Yes Stairs assistance: Min assist Stair Management: No rails;Step to pattern Number of Stairs: 3 (1 + 2) General stair comments: performed one step forward with RW with 50% VC's on proper tech then performed 2 steps up backward with RW with 50% VC's on proper tech and safety.  Both performed with spoue and handout given.  Wheelchair Mobility    Modified Rankin (Stroke Patients Only)       Balance                                    Cognition Arousal/Alertness: Awake/alert Behavior During Therapy: WFL for tasks assessed/performed Overall Cognitive Status: Within Functional Limits for tasks assessed                      Exercises      General Comments        Pertinent Vitals/Pain Pain Assessment: 0-10 Pain Score: 5  Pain Location: L knee Pain Descriptors / Indicators: Aching;Sore Pain Intervention(s): Monitored during session;Premedicated before session;Repositioned;Ice applied    Home Living                      Prior Function            PT Goals (current goals can now be found in the care plan section) Progress towards PT goals: Progressing toward goals    Frequency  7X/week  PT Plan      Co-evaluation             End of Session Equipment Utilized During Treatment: Gait belt;Left knee immobilizer Activity Tolerance: Patient limited by pain Patient left: in chair;with call bell/phone within reach;with family/visitor present     Time: 0920-0943 PT Time Calculation (min) (ACUTE ONLY): 23 min  Charges:  $Gait Training: 8-22 mins $Therapeutic Activity: 8-22 mins                    G Codes:      Rica Koyanagi  PTA [facility]  Acute  Rehab Pager      [phone]

## 2015-03-25 NOTE — Progress Notes (Addendum)
Discharged from floor via w/c, belongings & spouse with pt. No changes in assessment.  Traci Bird, [employer]

## 2015-03-25 NOTE — Care Management Note (Signed)
    Page 1 of 2   03/25/2015     4:18:30 PM CARE MANAGEMENT NOTE 03/25/2015  Patient:  ANJU, SERENO   Account Number:  [id number]  Date Initiated:  03/25/2015  Documentation initiated by:  DAVIS,RHONDA  Subjective/Objective Assessment:   left total knee     Action/Plan:   home   Anticipated DC Date:  03/25/2015   Anticipated DC Plan:  [place] referral  NA      [place]  CM consult      [facility] Choice  NA   Choice offered to / List presented to:  C-1 Patient   DME arranged  [place]      DME agency  [place] arranged  [place]   Status of service:  Completed, signed off Medicare Important Message given?   (If response is "NO", the following Medicare IM given date fields will be blank) Date Medicare IM given:   Medicare IM given by:   Date Additional Medicare IM given:   Additional Medicare IM given by:    Discharge Disposition:  [place]  Per UR Regulation:  Reviewed for med. necessity/level of care/duration of stay  If discussed at [place] of Stay Meetings, dates discussed:    Comments:  March 25, 2015/Rhonda L. Rosana Hoes, RN, BSN, CCM. Case Management [place] [phone] No discharge needs present of time of review.

## 2015-03-25 NOTE — Progress Notes (Signed)
Physical Therapy Treatment Patient Details Name: TEMILOLUWA RECCHIA MRN: [id number] DOB: 07-22-1961 Today's Date: 03/30/15    History of Present Illness L TKR    PT Comments    POD # 3 second session.  Pt pre medicated so was able to educate and complete TKR TE's following HEP handout.  Educated on proper tech and freq followed by use of ICE. Pt ready for D/C to home.   Follow Up Recommendations  Home health PT     Equipment Recommendations  None recommended by PT    Recommendations for Other Services       Precautions / Restrictions Precautions Precautions: Fall;Knee Precaution Comments: instructed on KI use for amb and stairs Required Braces or Orthoses: Knee Immobilizer - Left Knee Immobilizer - Left: Discontinue once straight leg raise with < 10 degree lag Restrictions Weight Bearing Restrictions: No Other Position/Activity Restrictions: WBAT       Balance                                    Cognition Arousal/Alertness: Awake/alert Behavior During Therapy: WFL for tasks assessed/performed Overall Cognitive Status: Within Functional Limits for tasks assessed                      Exercises   Total Knee Replacement TE's 10 reps B LE ankle pumps 10 reps towel squeezes 10 reps knee presses 10 reps heel slides  10 reps SAQ's 10 reps SLR's 10 reps ABD Followed by ICE     General Comments        Pertinent Vitals/Pain Pain Assessment: 0-10 Pain Score: 5  Pain Location: L knee Pain Descriptors / Indicators: Aching;Sore Pain Intervention(s): Monitored during session;Premedicated before session;Repositioned;Ice applied    Home Living                      Prior Function            PT Goals (current goals can now be found in the care plan section) Progress towards PT goals: Progressing toward goals    Frequency  7X/week    PT Plan      Co-evaluation             End of Session Equipment Utilized During  Treatment: Gait belt;Left knee immobilizer Activity Tolerance: Patient limited by pain Patient left: in chair;with call bell/phone within reach;with family/visitor present     Time: 1050-1103 PT Time Calculation (min) (ACUTE ONLY): 13 min  Charges:  $Therapeutic Exercise: 8-22 mins                     G Codes:      Nathanial Rancher 03/30/2015, 12:11 PM

## 2015-03-25 NOTE — Progress Notes (Signed)
Occupational Therapy Treatment Patient Details Name: ODALIZ MCQUEARY MRN: [id number] DOB: [birth year] Today's Date: 03/25/2015    History of present illness L TKR   OT comments  All education completed and patient to discharge home today. No further OT needs at this time.  Follow Up Recommendations  No OT follow up;Supervision/Assistance - 24 hour    Equipment Recommendations  3 in 1 bedside comode    Recommendations for Other Services      Precautions / Restrictions Precautions Precautions: Fall;Knee Required Braces or Orthoses: Knee Immobilizer - Left Knee Immobilizer - Left: Discontinue once straight leg raise with < 10 degree lag Restrictions Weight Bearing Restrictions: No       Mobility Bed Mobility                  Transfers                      Balance                                   ADL                                         General ADL Comments: Patient received up in recliner. She reports toileting via BSC and husband's assistance 5 times during the night and did well with transfer, toilet hygiene/clothing management. She declines to practice at this time. Says she feels good about going home today. Asked if she wanted to practice shower transfer. Reviewed technique again, similar to stair education, which patient just practiced with PT. Patient states she felt good about how she did the stairs and she feels good about the shower transfer without practicing. Husband agrees. Educated patient that husband should be present for showers initially. They verbalized understanding. Husband assisted patient with donning pajamas; they deny need for AE/AE education. All OT education completed. No further OT needs and patient to discharge home today.      Vision                     Perception     Praxis      Cognition   Behavior During Therapy: WFL for tasks assessed/performed Overall Cognitive Status:  Within Functional Limits for tasks assessed                       Extremity/Trunk Assessment               Exercises     Shoulder Instructions       General Comments      Pertinent Vitals/ Pain       Pain Assessment: 0-10 Pain Score: 6  Pain Location: L knee, leg Pain Descriptors / Indicators: Aching;Sore Pain Intervention(s): Limited activity within patient's tolerance;Monitored during session  Home Living                                          Prior Functioning/Environment              Frequency       Progress Toward Goals  OT Goals(current goals can now be found in the care plan section)  Progress towards  OT goals: Goals met/education completed, patient discharged from [place] goals met and education completed, patient discharged from OT services    Co-evaluation                 End of Session     Activity Tolerance Patient tolerated treatment well   Patient Left in chair;with call bell/phone within reach;with family/visitor present   Nurse Communication          Time: 8871-9597 OT Time Calculation (min): 10 min  Charges: OT General Charges $OT Visit: 1 Procedure OT Treatments $Self Care/Home Management : 8-22 mins  [place] No A 03/25/2015, 10:24 AM

## 2015-03-25 NOTE — Plan of Care (Signed)
Problem: Discharge Progression Outcomes Goal: Anticoagulant follow-up in place Outcome: Not Applicable Date Met:  09/40/00 xarelto

## 2015-03-25 NOTE — Discharge Summary (Signed)
Physician Discharge Summary   Patient ID: Traci Bird MRN: [id number] DOB/AGE: 56/30/1962 56 y.o.  Admit date: 03/22/2015 Discharge date: 03/25/2015  Admission Diagnoses:  Active Problems:   Left knee DJD   Discharge Diagnoses:  Same   Surgeries: Procedure(s): LEFT TOTAL KNEE ARTHROPLASTY on 03/22/2015   Consultants: PT/OT  Discharged Condition: Stable  Hospital Course: Traci Bird is an 54 y.o. female who was admitted 03/22/2015 with a chief complaint of No chief complaint on file. , and found to have a diagnosis of <principal problem not specified>.  They were brought to the operating room on 03/22/2015 and underwent the above named procedures.    The patient had an uncomplicated hospital course and was stable for discharge.  Recent vital signs:  Filed Vitals:   03/25/15 0541  BP:   Pulse:   Temp: 98.8 F (37.1 C)  Resp:     Recent laboratory studies:  Results for orders placed or performed during the hospital encounter of 03/22/15  CBC  Result Value Ref Range   WBC 9.4 4.0 - 10.5 K/uL   RBC 3.31 (L) 3.87 - 5.11 MIL/uL   Hemoglobin 9.2 (L) 12.0 - 15.0 g/dL   HCT 29.0 (L) 36.0 - 46.0 %   MCV 87.6 78.0 - 100.0 fL   MCH 27.8 26.0 - 34.0 pg   MCHC 31.7 30.0 - 36.0 g/dL   RDW 13.6 11.5 - 15.5 %   Platelets 171 150 - 400 K/uL  Basic metabolic panel  Result Value Ref Range   Sodium 139 135 - 145 mmol/L   Potassium 4.5 3.5 - 5.1 mmol/L   Chloride 106 96 - 112 mmol/L   CO2 29 19 - 32 mmol/L   Glucose, Bld 151 (H) 70 - 99 mg/dL   BUN 8 6 - 23 mg/dL   Creatinine, Ser 0.56 0.50 - 1.10 mg/dL   Calcium 8.7 8.4 - 10.5 mg/dL   GFR calc non Af Amer >90 >90 mL/min   GFR calc Af Amer >90 >90 mL/min   Anion gap 4 (L) 5 - 15  CBC  Result Value Ref Range   WBC 7.4 4.0 - 10.5 K/uL   RBC 2.97 (L) 3.87 - 5.11 MIL/uL   Hemoglobin 8.1 (L) 12.0 - 15.0 g/dL   HCT 26.1 (L) 36.0 - 46.0 %   MCV 87.9 78.0 - 100.0 fL   MCH 27.3 26.0 - 34.0 pg   MCHC 31.0 30.0 - 36.0  g/dL   RDW 13.7 11.5 - 15.5 %   Platelets 153 150 - 400 K/uL  Basic metabolic panel  Result Value Ref Range   Sodium 139 135 - 145 mmol/L   Potassium 3.8 3.5 - 5.1 mmol/L   Chloride 105 96 - 112 mmol/L   CO2 30 19 - 32 mmol/L   Glucose, Bld 121 (H) 70 - 99 mg/dL   BUN 8 6 - 23 mg/dL   Creatinine, Ser 0.56 0.50 - 1.10 mg/dL   Calcium 8.2 (L) 8.4 - 10.5 mg/dL   GFR calc non Af Amer >90 >90 mL/min   GFR calc Af Amer >90 >90 mL/min   Anion gap 4 (L) 5 - 15  CBC  Result Value Ref Range   WBC 9.0 4.0 - 10.5 K/uL   RBC 3.08 (L) 3.87 - 5.11 MIL/uL   Hemoglobin 8.5 (L) 12.0 - 15.0 g/dL   HCT 27.0 (L) 36.0 - 46.0 %   MCV 87.7 78.0 - 100.0 fL   MCH 27.6 26.0 -  34.0 pg   MCHC 31.5 30.0 - 36.0 g/dL   RDW 13.3 11.5 - 15.5 %   Platelets 175 150 - 400 K/uL  Basic metabolic panel  Result Value Ref Range   Sodium 137 135 - 145 mmol/L   Potassium 4.2 3.5 - 5.1 mmol/L   Chloride 103 96 - 112 mmol/L   CO2 28 19 - 32 mmol/L   Glucose, Bld 120 (H) 70 - 99 mg/dL   BUN 9 6 - 23 mg/dL   Creatinine, Ser 0.55 0.50 - 1.10 mg/dL   Calcium 8.7 8.4 - 10.5 mg/dL   GFR calc non Af Amer >90 >90 mL/min   GFR calc Af Amer >90 >90 mL/min   Anion gap 6 5 - 15  Type and screen  Result Value Ref Range   ABO/RH(D) A POS    Antibody Screen NEG    Sample Expiration 03/25/2015   ABO/Rh  Result Value Ref Range   ABO/RH(D) A POS     Discharge Medications:     Medication List    STOP taking these medications        HYDROcodone-acetaminophen 10-325 MG per tablet  Commonly known as:  NORCO      TAKE these medications        B-12 PO  Take 1 tablet by mouth daily.     busPIRone 10 MG tablet  Commonly known as:  BUSPAR  Take 5 mg by mouth 2 (two) times daily.     cetirizine 10 MG tablet  Commonly known as:  ZYRTEC  Take 10 mg by mouth daily.     citalopram 20 MG tablet  Commonly known as:  CELEXA  Take 20 mg by mouth at bedtime.     docusate sodium 100 MG capsule  Commonly known as:  COLACE    Take 1 capsule (100 mg total) by mouth 2 (two) times daily as needed for mild constipation.     methocarbamol 500 MG tablet  Commonly known as:  ROBAXIN  Take 1 tablet (500 mg total) by mouth every 8 (eight) hours as needed for muscle spasms.     oxyCODONE-acetaminophen 5-325 MG per tablet  Commonly known as:  PERCOCET  Take 1-2 tablets by mouth every 4 (four) hours as needed.     prochlorperazine 10 MG tablet  Commonly known as:  COMPAZINE  Take 10 mg by mouth every 6 (six) hours as needed for nausea or vomiting.     rivaroxaban 10 MG Tabs tablet  Commonly known as:  XARELTO  Take 1 tablet (10 mg total) by mouth daily.     SYNTHROID 88 MCG tablet  Generic drug:  levothyroxine  Take 75 mcg by mouth every morning.     traMADol 50 MG tablet  Commonly known as:  ULTRAM  Take 50 mg by mouth every 6 (six) hours as needed for moderate pain.     zolpidem 5 MG tablet  Commonly known as:  AMBIEN  Take 5 mg by mouth at bedtime as needed for sleep.        Diagnostic Studies: Dg Knee 1-2 Views Left  2015-03-31   CLINICAL DATA:  Preoperative examination. Patient for left knee surgery 03/22/2015.  EXAM: LEFT KNEE - 1-2 VIEW  COMPARISON:  Plain films left knee 08/15/2009.  FINDINGS: No acute bony or joint abnormality is identified. Degenerative change is seen about the knee with small osteophytes about all 3 compartments. No joint effusion is identified. There is no focal bony lesion.  IMPRESSION: No acute abnormality.  Osteoarthritis.   Electronically Signed   By: Inge Rise M.D.   On: 03/14/2015 10:12   Dg Knee Left Port  03/22/2015   CLINICAL DATA:  Status post knee replacement.  EXAM: PORTABLE LEFT KNEE - 1-2 VIEW  COMPARISON:  Plain films of the left knee 03/14/2015.  FINDINGS: The patient has a new left total knee arthroplasty. Gas in the soft tissues and surgical drain are noted. The device is located. No fracture is identified. Hardware is intact.  IMPRESSION: Status post left  knee replacement without evidence complication.   Electronically Signed   By: Inge Rise M.D.   On: 03/22/2015 10:04    Disposition: 01-Home or Self Care        Follow-up Information    Follow up with BEANE,JEFFREY C, MD In 2 weeks.   Specialty:  Orthopedic Surgery   Why:  For suture removal   Contact information:   [address] [place] [address] [phone]        Signed: Ventura Bruns 03/25/2015, 7:46 AM

## 2015-03-28 ENCOUNTER — Other Ambulatory Visit (HOSPITAL_COMMUNITY): Payer: Self-pay | Admitting: Specialist

## 2015-03-28 DIAGNOSIS — M79662 Pain in left lower leg: Principal | ICD-10-CM

## 2015-03-28 DIAGNOSIS — M79661 Pain in right lower leg: Secondary | ICD-10-CM

## 2015-03-29 ENCOUNTER — Ambulatory Visit (HOSPITAL_COMMUNITY)
Admission: RE | Admit: 2015-03-29 | Discharge: 2015-03-29 | Disposition: A | Discharge status: Home / Self Care | Payer: 59 | Source: Ambulatory Visit | Attending: Cardiology | Admitting: Cardiology

## 2015-03-29 DIAGNOSIS — M79661 Pain in right lower leg: Secondary | ICD-10-CM | POA: Insufficient documentation

## 2015-03-29 DIAGNOSIS — M79662 Pain in left lower leg: Secondary | ICD-10-CM | POA: Diagnosis not present

## 2015-03-29 NOTE — Progress Notes (Signed)
Left lower extremity venous duplex completed. No evidence for DVT, SVT, or Baker's cyst. °Brianna L Mazza, RVT °

## 2015-04-04 ENCOUNTER — Telehealth (HOSPITAL_COMMUNITY): Payer: Self-pay | Admitting: *Deleted

## 2015-09-28 ENCOUNTER — Encounter: Payer: Self-pay | Admitting: Vascular Surgery

## 2015-10-03 ENCOUNTER — Ambulatory Visit (INDEPENDENT_AMBULATORY_CARE_PROVIDER_SITE_OTHER): Payer: 59 | Admitting: Vascular Surgery

## 2015-10-03 ENCOUNTER — Encounter: Payer: Self-pay | Admitting: Vascular Surgery

## 2015-10-03 ENCOUNTER — Other Ambulatory Visit (HOSPITAL_COMMUNITY): Payer: 59

## 2015-10-03 VITALS — BP 108/75 | HR 72 | Temp 98.6°F | Resp 16 | Ht 61.5 in | Wt 194.0 lb

## 2015-10-03 DIAGNOSIS — R2231 Localized swelling, mass and lump, right upper limb: Secondary | ICD-10-CM

## 2015-10-03 DIAGNOSIS — Q2730 Arteriovenous malformation, site unspecified: Secondary | ICD-10-CM | POA: Insufficient documentation

## 2015-10-03 DIAGNOSIS — I729 Aneurysm of unspecified site: Secondary | ICD-10-CM

## 2015-10-03 NOTE — Progress Notes (Signed)
Referred by:  Ysidro Evert, PA-C [address] [place] [address]   Reason for referral: R 2nd finger mass   History of Present Illness  Traci Bird is a 54 y.o. (05-26-1961) female who presents with chief complaint: painful mass overlying R 2nd finger.  Pt notes onset of mass on dorsal surface of R 2nd finger at proximal phalange/MCP joint after trip to beach.  No obvious injury or eliciting trigger.  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   Pain is vague in character with mild to moderate intensity, no obvious triggers other than use.   Reported, she had this mass aspirated in a Ortho office with the though that it was ganglion cyst.  It began bleeding and requiring extend compression to stop bleeding.    Past Medical History  Diagnosis Date  . Hypothyroidism   . Chondromalacia of left knee   . Acute meniscal tear of knee LEFT  . Arthritis HIPS , KNEES, HANDS  . History of diabetes mellitus NO ISSUE SINCE GASTRIC BYPASS  . History of sleep apnea NO ISSUES SINCE GASTRIC BYPASS  . Depression   . Anxiety     Past Surgical History  Procedure Laterality Date  . Gastric bypass  2008  . Tonsillectomy and adenoidectomy  1966  . Septoplasty  1987  . Bilateral breast reduction  1992  . Neuroplasty / transposition median nerve at carpal tunnel bilateral  2002  . Tubal ligation  1984  . Tendon repair heel  2011    left  . Partial thyoidectomy  1994  . Abdominal hysterectomy  11-06-2005  DR HENLEY    W/ BILATERAL SALPINGO-OOPHORECTOMY  . Hysteroscopy w/d&c  05-16-2005  DR HENLEY    POLYPECTOMY  . Knee arthroscopy  10/29/2012    Procedure: ARTHROSCOPY KNEE;  Surgeon: Johnn Hai, MD;  Location: [facility];  Service: Orthopedics;  Laterality: Left;  WITH DEBRIDEMENT  . Knee arthroscopy with lateral release  10/29/2012    Procedure: KNEE  ARTHROSCOPY WITH LATERAL RELEASE;  Surgeon: Johnn Hai, MD;  Location: [facility];  Service: Orthopedics;  Laterality: Left;  . Total knee arthroplasty Left 03/22/2015    Procedure: LEFT TOTAL KNEE ARTHROPLASTY;  Surgeon: Susa Day, MD;  Location: [facility];  Service: Orthopedics;  Laterality: Left;  . Joint replacement Left March 22, 2015    Knee    Social History   Social History  . Marital Status: Married    Spouse Name: N/A  . Number of Children: N/A  . Years of Education: N/A   Occupational History  . Not on file.   Social History Main Topics  . Smoking status: Never Smoker   . Smokeless tobacco: Never Used  . Alcohol Use: No  . Drug Use: No  . Sexual Activity: Not on file   Other Topics Concern  . Not on file   Social History Narrative    Family History  Problem Relation Age of Onset  . Colon cancer Neg Hx   . Stomach cancer Neg Hx   . Diabetes Mother   . Hyperlipidemia Mother   . Hypertension Mother   . Diabetes Father   . Hyperlipidemia Father   . Heart disease Father     CAD- Open Heart    Current Outpatient Prescriptions  Medication Sig Dispense Refill  . busPIRone (BUSPAR) 10  MG tablet Take 5 mg by mouth 2 (two) times daily.    . citalopram (CELEXA) 20 MG tablet Take 20 mg by mouth at bedtime.    . Cyanocobalamin (B-12 PO) Take 1 tablet by mouth daily.    Marland Kitchen levothyroxine (SYNTHROID) 88 MCG tablet Take 75 mcg by mouth every morning.     . methocarbamol (ROBAXIN) 500 MG tablet Take 1 tablet (500 mg total) by mouth every 8 (eight) hours as needed for muscle spasms. 60 tablet 1  . oxyCODONE-acetaminophen (PERCOCET) 10-325 MG tablet as needed.    . prochlorperazine (COMPAZINE) 10 MG tablet Take 10 mg by mouth every 6 (six) hours as needed for nausea or vomiting.    Marland Kitchen zolpidem (AMBIEN) 5 MG tablet Take 5 mg by mouth at bedtime as needed for sleep.     Marland Kitchen amoxicillin-clavulanate (AUGMENTIN) 875-125 MG tablet     . cephALEXin (KEFLEX) 500  MG capsule     . cetirizine (ZYRTEC) 10 MG tablet Take 10 mg by mouth daily.    Marland Kitchen docusate sodium (COLACE) 100 MG capsule Take 1 capsule (100 mg total) by mouth 2 (two) times daily as needed for mild constipation. (Patient not taking: Reported on 10/03/2015) 20 capsule 1  . gabapentin (NEURONTIN) 300 MG capsule     . levothyroxine (SYNTHROID, LEVOTHROID) 75 MCG tablet     . oxyCODONE-acetaminophen (PERCOCET) 5-325 MG per tablet Take 1-2 tablets by mouth every 4 (four) hours as needed. (Patient not taking: Reported on 10/03/2015) 60 tablet 0  . predniSONE (DELTASONE) 5 MG tablet     . promethazine (PHENERGAN) 25 MG tablet     . promethazine-dextromethorphan (PROMETHAZINE-DM) 6.25-15 MG/5ML syrup     . rivaroxaban (XARELTO) 10 MG TABS tablet Take 1 tablet (10 mg total) by mouth daily. (Patient not taking: Reported on 10/03/2015) 21 tablet 0  . traMADol (ULTRAM) 50 MG tablet Take 50 mg by mouth every 6 (six) hours as needed for moderate pain.     . Vitamin D, Ergocalciferol, (DRISDOL) 50000 UNITS CAPS capsule     . zolpidem (AMBIEN) 10 MG tablet      No current facility-administered medications for this visit.     Allergies  Allergen Reactions  . Morphine And Related Itching    Pump      REVIEW OF SYSTEMS:  (Positives checked otherwise negative)  CARDIOVASCULAR:   [ ]  chest pain,  [ ]  chest pressure,  [ ]  palpitations,  [ ]  shortness of breath when laying flat,  [ ]  shortness of breath with exertion,   [ ]  pain in feet when walking,  [ ]  pain in feet when laying flat, [ ]  history of blood clot in veins (DVT),  [ ]  history of phlebitis,  [ ]  swelling in legs,  [ ]  varicose veins  PULMONARY:   [ ]  productive cough,  [ ]  asthma,  [ ]  wheezing  NEUROLOGIC:   [ ]  weakness in arms or legs,  [ ]  numbness in arms or legs,  [ ]  difficulty speaking or slurred speech,  [ ]  temporary loss of vision in one eye,  [ ]  dizziness  HEMATOLOGIC:   [ ]  bleeding problems,  [ ]  problems  with blood clotting too easily  MUSCULOSKEL:   [x]  joint pain, [ ]  joint swelling  GASTROINTEST:   [ ]  vomiting blood,  [ ]  blood in stool     GENITOURINARY:   [ ]  burning with urination,  [ ]  blood in urine  PSYCHIATRIC:   [ ]  history of major depression  INTEGUMENTARY:   [ ]  rashes,  [ ]  ulcers  CONSTITUTIONAL:   [ ]  fever,  [ ]  chills   Physical Examination  Filed Vitals:   10/03/15 1009  BP: 108/75  Pulse: 72  Temp: 98.6 F (37 C)  TempSrc: Oral  Resp: 16  Height: 5' 1.5" (1.562 m)  Weight: 194 lb (87.998 kg)  SpO2: 96%   Body mass index is 36.07 kg/(m^2).  General: A&O x 3, WD, Obese,   Head: [place]/AT  Ear/Nose/Throat: Hearing grossly intact, nares w/o erythema or drainage, oropharynx w/o Erythema/Exudate, Mallampati score: 3  Eyes: PERRLA, EOMI  Neck: Supple, no nuchal rigidity, no palpable LAD  Pulmonary: Sym exp, good air movt, CTAB, no rales, rhonchi, & wheezing  Cardiac: RRR, Nl S1, S2, no Murmurs, rubs or gallops  Vascular: Vessel Right Left  Radial Palpable Palpable  Brachial Palpable Palpable  Carotid Palpable, without bruit Palpable, without bruit  Aorta Not palpable N/A  Femoral Palpable Palpable  Popliteal Not palpable Not palpable  PT Palpable Palpable  DP Palpable Palpable   Gastrointestinal: soft, NTND, no G/R, no HSM, no masses, no CVAT B  Musculoskeletal: M/S 5/5 throughout , Extremities without ischemic changes , somewhat cyanotic ballotable mass overlying the R 2nd proximal phalanage/MCP joint, +arterial signal in mass  Neurologic: CN 2-12 intact , Pain and light touch intact in extremities , Motor exam as listed above  Psychiatric: Judgment intact, Mood & affect appropriate for pt's clinical situation  Dermatologic: See M/S exam for extremity exam, no rashes otherwise noted  Lymph : No Cervical, Axillary, or Inguinal lymphadenopathy    Outside Studies/Documentation 4 pages of outside documents were reviewed  including: outpatient ortho chart.   Medical Decision Making  Traci Bird is a 54 y.o. female who presents with: R 2nd finger mass possible digital artery aneurysm vs AVM   Surprisingly, the flow signature in the mass is arterial.  From my exam, I thought this was venous in nature.  Subsequently, I think a R hand angiogram is going to be necessary.  I would approach this from a femoral approach to get full evaluation of the proximal arteries also.  Subsequent intervention will depend on the findings on angiography.  I discussed with the patient the nature of angiographic procedures, especially the limited patencies of any endovascular intervention.  The patient is aware of that the risks of an angiographic procedure include but are not limited to: bleeding, infection, access site complications, renal failure, embolization, rupture of vessel, dissection, possible need for emergent surgical intervention, possible need for surgical procedures to treat the patient's pathology, anaphylactic reaction to contrast, and stroke and death.    The patient is aware of the risks and agrees to proceed.  Thank you for allowing [place] to participate in this patient's care.   Adele Barthel, MD [facility] Office: [phone] Pager: [phone]  10/03/2015, 5:27 PM

## 2015-10-04 ENCOUNTER — Other Ambulatory Visit: Payer: Self-pay

## 2015-10-12 ENCOUNTER — Ambulatory Visit (HOSPITAL_COMMUNITY)
Admission: RE | Admit: 2015-10-12 | Discharge: 2015-10-12 | Disposition: A | Discharge status: Home / Self Care | Payer: 59 | Source: Ambulatory Visit | Attending: Vascular Surgery | Admitting: Vascular Surgery

## 2015-10-12 ENCOUNTER — Other Ambulatory Visit: Payer: Self-pay

## 2015-10-12 ENCOUNTER — Encounter (HOSPITAL_COMMUNITY)
Admission: RE | Disposition: A | Discharge status: Home / Self Care | Payer: Self-pay | Source: Ambulatory Visit | Attending: Vascular Surgery

## 2015-10-12 DIAGNOSIS — Q2739 Arteriovenous malformation, other site: Secondary | ICD-10-CM | POA: Diagnosis not present

## 2015-10-12 DIAGNOSIS — F329 Major depressive disorder, single episode, unspecified: Secondary | ICD-10-CM | POA: Insufficient documentation

## 2015-10-12 DIAGNOSIS — Q2731 Arteriovenous malformation of vessel of upper limb: Secondary | ICD-10-CM | POA: Diagnosis not present

## 2015-10-12 DIAGNOSIS — M1990 Unspecified osteoarthritis, unspecified site: Secondary | ICD-10-CM | POA: Diagnosis not present

## 2015-10-12 DIAGNOSIS — Z6836 Body mass index (BMI) 36.0-36.9, adult: Secondary | ICD-10-CM | POA: Insufficient documentation

## 2015-10-12 DIAGNOSIS — E039 Hypothyroidism, unspecified: Secondary | ICD-10-CM | POA: Insufficient documentation

## 2015-10-12 DIAGNOSIS — E669 Obesity, unspecified: Secondary | ICD-10-CM | POA: Insufficient documentation

## 2015-10-12 DIAGNOSIS — Z9884 Bariatric surgery status: Secondary | ICD-10-CM | POA: Insufficient documentation

## 2015-10-12 DIAGNOSIS — Z7901 Long term (current) use of anticoagulants: Secondary | ICD-10-CM | POA: Insufficient documentation

## 2015-10-12 DIAGNOSIS — F419 Anxiety disorder, unspecified: Secondary | ICD-10-CM | POA: Insufficient documentation

## 2015-10-12 HISTORY — PX: PERIPHERAL VASCULAR CATHETERIZATION: SHX172C

## 2015-10-12 LAB — POCT I-STAT, CHEM 8
BUN: 17 mg/dL (ref 6–20)
CHLORIDE: 104 mmol/L (ref 101–111)
Calcium, Ion: 1.21 mmol/L (ref 1.12–1.23)
Creatinine, Ser: 0.7 mg/dL (ref 0.44–1.00)
Glucose, Bld: 78 mg/dL (ref 65–99)
HEMATOCRIT: 37 % (ref 36.0–46.0)
Hemoglobin: 12.6 g/dL (ref 12.0–15.0)
Potassium: 3.5 mmol/L (ref 3.5–5.1)
SODIUM: 144 mmol/L (ref 135–145)
TCO2: 25 mmol/L (ref 0–100)

## 2015-10-12 SURGERY — UPPER EXTREMITY ANGIOGRAPHY
Anesthesia: LOCAL

## 2015-10-12 MED ORDER — MIDAZOLAM HCL 2 MG/2ML IJ SOLN
INTRAMUSCULAR | Status: DC | PRN
  Administered 2015-10-12: 1 mg via INTRAVENOUS

## 2015-10-12 MED ORDER — NITROGLYCERIN 1 MG/10 ML FOR IR/CATH LAB
INTRA_ARTERIAL | Status: AC
  Filled 2015-10-12: qty 10

## 2015-10-12 MED ORDER — LIDOCAINE HCL (PF) 1 % IJ SOLN
INTRAMUSCULAR | Status: AC
  Filled 2015-10-12: qty 30

## 2015-10-12 MED ORDER — NITROGLYCERIN 1 MG/10 ML FOR IR/CATH LAB
INTRA_ARTERIAL | Status: DC | PRN
  Administered 2015-10-12: 9 mL
  Administered 2015-10-12 (×2)

## 2015-10-12 MED ORDER — HYDRALAZINE HCL 20 MG/ML IJ SOLN: 5.0000 mg | INTRAMUSCULAR | Status: DC | PRN

## 2015-10-12 MED ORDER — ACETAMINOPHEN 325 MG RE SUPP
325.0000 mg | RECTAL | Status: DC | PRN
  Filled 2015-10-12: qty 2

## 2015-10-12 MED ORDER — HEPARIN (PORCINE) IN NACL 2-0.9 UNIT/ML-% IJ SOLN
INTRAMUSCULAR | Status: AC
  Filled 2015-10-12: qty 1000

## 2015-10-12 MED ORDER — LABETALOL HCL 5 MG/ML IV SOLN: 10.0000 mg | INTRAVENOUS | Status: DC | PRN

## 2015-10-12 MED ORDER — ACETAMINOPHEN 325 MG PO TABS
325.0000 mg | ORAL_TABLET | ORAL | Status: DC | PRN
  Filled 2015-10-12: qty 2

## 2015-10-12 MED ORDER — MIDAZOLAM HCL 2 MG/2ML IJ SOLN
INTRAMUSCULAR | Status: AC
  Filled 2015-10-12: qty 4

## 2015-10-12 MED ORDER — METOPROLOL TARTRATE 1 MG/ML IV SOLN
2.0000 mg | INTRAVENOUS | Status: DC | PRN
Start: 2015-10-12 — End: 2015-10-12

## 2015-10-12 MED ORDER — SODIUM CHLORIDE 0.45 % IV SOLN
INTRAVENOUS | Status: DC
  Administered 2015-10-12: 10:00:00 via INTRAVENOUS

## 2015-10-12 MED ORDER — SODIUM CHLORIDE 0.9 % IV SOLN
INTRAVENOUS | Status: DC
  Administered 2015-10-12: 1000 mL via INTRAVENOUS

## 2015-10-12 MED ORDER — ONDANSETRON HCL 4 MG/2ML IJ SOLN: 4.0000 mg | Freq: Four times a day (QID) | INTRAMUSCULAR | Status: DC | PRN

## 2015-10-12 SURGICAL SUPPLY — 12 items
CATH ANGIO 5F PIGTAIL 100CM (CATHETERS) ×2 IMPLANT
CATH HEADHUNTER H1 5F 100CM (CATHETERS) ×2 IMPLANT
COVER PRB 48X5XTLSCP FOLD TPE (BAG) ×1 IMPLANT
COVER PROBE 5X48 (BAG) ×1
KIT PV (KITS) ×2 IMPLANT
SHEATH PINNACLE 5F 10CM (SHEATH) ×2 IMPLANT
STOPCOCK MORSE 400PSI 3WAY (MISCELLANEOUS) ×2 IMPLANT
SYR MEDRAD MARK V 150ML (SYRINGE) ×2 IMPLANT
TRANSDUCER W/STOPCOCK (MISCELLANEOUS) ×2 IMPLANT
TRAY PV CATH (CUSTOM PROCEDURE TRAY) ×2 IMPLANT
TUBING CIL FLEX 10 FLL-RA (TUBING) ×2 IMPLANT
WIRE HI TORQ VERSACORE-J 145CM (WIRE) ×2 IMPLANT

## 2015-10-12 NOTE — H&P (View-Only) (Signed)
Referred by:  Ysidro Evert, PA-C [address], [place] [address]   Reason for referral: R 2nd finger mass   History of Present Illness  Traci Bird is a 54 y.o. ([birth year]) female who presents with chief complaint: painful mass overlying R 2nd finger.  Pt notes onset of mass on dorsal surface of R 2nd finger at proximal phalange/MCP joint after trip to beach.  No obvious injury or eliciting trigger.  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   Pain is vague in character with mild to moderate intensity, no obvious triggers other than use.   Reported, she had this mass aspirated in a Ortho office with the though that it was ganglion cyst.  It began bleeding and requiring extend compression to stop bleeding.    Past Medical History  Diagnosis Date  . Hypothyroidism   . Chondromalacia of left knee   . Acute meniscal tear of knee LEFT  . Arthritis HIPS , KNEES, HANDS  . History of diabetes mellitus NO ISSUE SINCE GASTRIC BYPASS  . History of sleep apnea NO ISSUES SINCE GASTRIC BYPASS  . Depression   . Anxiety     Past Surgical History  Procedure Laterality Date  . Gastric bypass  2008  . Tonsillectomy and adenoidectomy  1966  . Septoplasty  1987  . Bilateral breast reduction  1992  . Neuroplasty / transposition median nerve at carpal tunnel bilateral  2002  . Tubal ligation  1984  . Tendon repair heel  2011    left  . Partial thyoidectomy  1994  . Abdominal hysterectomy  11-06-2005  DR HENLEY    W/ BILATERAL SALPINGO-OOPHORECTOMY  . Hysteroscopy w/d&c  05-16-2005  DR HENLEY    POLYPECTOMY  . Knee arthroscopy  10/29/2012    Procedure: ARTHROSCOPY KNEE;  Surgeon: Johnn Hai, MD;  Location: [facility];  Service: Orthopedics;  Laterality: Left;  WITH DEBRIDEMENT  . Knee arthroscopy with lateral release  10/29/2012    Procedure: KNEE  ARTHROSCOPY WITH LATERAL RELEASE;  Surgeon: Johnn Hai, MD;  Location: [facility];  Service: Orthopedics;  Laterality: Left;  . Total knee arthroplasty Left 03/22/2015    Procedure: LEFT TOTAL KNEE ARTHROPLASTY;  Surgeon: Susa Day, MD;  Location: [facility];  Service: Orthopedics;  Laterality: Left;  . Joint replacement Left March 22, 2015    Knee    Social History   Social History  . Marital Status: Married    Spouse Name: N/A  . Number of Children: N/A  . Years of Education: N/A   Occupational History  . Not on file.   Social History Main Topics  . Smoking status: Never Smoker   . Smokeless tobacco: Never Used  . Alcohol Use: No  . Drug Use: No  . Sexual Activity: Not on file   Other Topics Concern  . Not on file   Social History Narrative    Family History  Problem Relation Age of Onset  . Colon cancer Neg Hx   . Stomach cancer Neg Hx   . Diabetes Mother   . Hyperlipidemia Mother   . Hypertension Mother   . Diabetes Father   . Hyperlipidemia Father   . Heart disease Father     CAD- Open Heart    Current Outpatient Prescriptions  Medication Sig Dispense Refill  . busPIRone (BUSPAR) 10  MG tablet Take 5 mg by mouth 2 (two) times daily.    . citalopram (CELEXA) 20 MG tablet Take 20 mg by mouth at bedtime.    . Cyanocobalamin (B-12 PO) Take 1 tablet by mouth daily.    Marland Kitchen levothyroxine (SYNTHROID) 88 MCG tablet Take 75 mcg by mouth every morning.     . methocarbamol (ROBAXIN) 500 MG tablet Take 1 tablet (500 mg total) by mouth every 8 (eight) hours as needed for muscle spasms. 60 tablet 1  . oxyCODONE-acetaminophen (PERCOCET) 10-325 MG tablet as needed.    . prochlorperazine (COMPAZINE) 10 MG tablet Take 10 mg by mouth every 6 (six) hours as needed for nausea or vomiting.    Marland Kitchen zolpidem (AMBIEN) 5 MG tablet Take 5 mg by mouth at bedtime as needed for sleep.     Marland Kitchen amoxicillin-clavulanate (AUGMENTIN) 875-125 MG tablet     . cephALEXin (KEFLEX) 500  MG capsule     . cetirizine (ZYRTEC) 10 MG tablet Take 10 mg by mouth daily.    Marland Kitchen docusate sodium (COLACE) 100 MG capsule Take 1 capsule (100 mg total) by mouth 2 (two) times daily as needed for mild constipation. (Patient not taking: Reported on 10/03/2015) 20 capsule 1  . gabapentin (NEURONTIN) 300 MG capsule     . levothyroxine (SYNTHROID, LEVOTHROID) 75 MCG tablet     . oxyCODONE-acetaminophen (PERCOCET) 5-325 MG per tablet Take 1-2 tablets by mouth every 4 (four) hours as needed. (Patient not taking: Reported on 10/03/2015) 60 tablet 0  . predniSONE (DELTASONE) 5 MG tablet     . promethazine (PHENERGAN) 25 MG tablet     . promethazine-dextromethorphan (PROMETHAZINE-DM) 6.25-15 MG/5ML syrup     . rivaroxaban (XARELTO) 10 MG TABS tablet Take 1 tablet (10 mg total) by mouth daily. (Patient not taking: Reported on 10/03/2015) 21 tablet 0  . traMADol (ULTRAM) 50 MG tablet Take 50 mg by mouth every 6 (six) hours as needed for moderate pain.     . Vitamin D, Ergocalciferol, (DRISDOL) 50000 UNITS CAPS capsule     . zolpidem (AMBIEN) 10 MG tablet      No current facility-administered medications for this visit.     Allergies  Allergen Reactions  . Morphine And Related Itching    Pump      REVIEW OF SYSTEMS:  (Positives checked otherwise negative)  CARDIOVASCULAR:   [ ]  chest pain,  [ ]  chest pressure,  [ ]  palpitations,  [ ]  shortness of breath when laying flat,  [ ]  shortness of breath with exertion,   [ ]  pain in feet when walking,  [ ]  pain in feet when laying flat, [ ]  history of blood clot in veins (DVT),  [ ]  history of phlebitis,  [ ]  swelling in legs,  [ ]  varicose veins  PULMONARY:   [ ]  productive cough,  [ ]  asthma,  [ ]  wheezing  NEUROLOGIC:   [ ]  weakness in arms or legs,  [ ]  numbness in arms or legs,  [ ]  difficulty speaking or slurred speech,  [ ]  temporary loss of vision in one eye,  [ ]  dizziness  HEMATOLOGIC:   [ ]  bleeding problems,  [ ]  problems  with blood clotting too easily  MUSCULOSKEL:   [x]  joint pain, [ ]  joint swelling  GASTROINTEST:   [ ]  vomiting blood,  [ ]  blood in stool     GENITOURINARY:   [ ]  burning with urination,  [ ]  blood in urine  PSYCHIATRIC:   [ ]  history of major depression  INTEGUMENTARY:   [ ]  rashes,  [ ]  ulcers  CONSTITUTIONAL:   [ ]  fever,  [ ]  chills   Physical Examination  Filed Vitals:   10/03/15 1009  BP: 108/75  Pulse: 72  Temp: 98.6 F (37 C)  TempSrc: Oral  Resp: 16  Height: 5' 1.5" (1.562 m)  Weight: 194 lb (87.998 kg)  SpO2: 96%   Body mass index is 36.07 kg/(m^2).  General: A&O x 3, WD, Obese,   Head: [place]/AT  Ear/Nose/Throat: Hearing grossly intact, nares w/o erythema or drainage, oropharynx w/o Erythema/Exudate, Mallampati score: 3  Eyes: PERRLA, EOMI  Neck: Supple, no nuchal rigidity, no palpable LAD  Pulmonary: Sym exp, good air movt, CTAB, no rales, rhonchi, & wheezing  Cardiac: RRR, Nl S1, S2, no Murmurs, rubs or gallops  Vascular: Vessel Right Left  Radial Palpable Palpable  Brachial Palpable Palpable  Carotid Palpable, without bruit Palpable, without bruit  Aorta Not palpable N/A  Femoral Palpable Palpable  Popliteal Not palpable Not palpable  PT Palpable Palpable  DP Palpable Palpable   Gastrointestinal: soft, NTND, no G/R, no HSM, no masses, no CVAT B  Musculoskeletal: M/S 5/5 throughout , Extremities without ischemic changes , somewhat cyanotic ballotable mass overlying the R 2nd proximal phalanage/MCP joint, +arterial signal in mass  Neurologic: CN 2-12 intact , Pain and light touch intact in extremities , Motor exam as listed above  Psychiatric: Judgment intact, Mood & affect appropriate for pt's clinical situation  Dermatologic: See M/S exam for extremity exam, no rashes otherwise noted  Lymph : No Cervical, Axillary, or Inguinal lymphadenopathy    Outside Studies/Documentation 4 pages of outside documents were reviewed  including: outpatient ortho chart.   Medical Decision Making  Traci Bird is a 54 y.o. female who presents with: R 2nd finger mass possible digital artery aneurysm vs AVM   Surprisingly, the flow signature in the mass is arterial.  From my exam, I thought this was venous in nature.  Subsequently, I think a R hand angiogram is going to be necessary.  I would approach this from a femoral approach to get full evaluation of the proximal arteries also.  Subsequent intervention will depend on the findings on angiography.  I discussed with the patient the nature of angiographic procedures, especially the limited patencies of any endovascular intervention.  The patient is aware of that the risks of an angiographic procedure include but are not limited to: bleeding, infection, access site complications, renal failure, embolization, rupture of vessel, dissection, possible need for emergent surgical intervention, possible need for surgical procedures to treat the patient's pathology, anaphylactic reaction to contrast, and stroke and death.    The patient is aware of the risks and agrees to proceed.  Thank you for allowing [place] to participate in this patient's care.   Adele Barthel, MD [facility] Office: [phone] Pager: [phone]  10/03/2015, 5:27 PM

## 2015-10-12 NOTE — Op Note (Signed)
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 The patient was placed in supine position on the Angio table. Both groins were prepped and draped in usual sterile fashion. Local anesthesia was initiated over the right common femoral artery. Ultrasound was used to identify the right common femoral artery in an introducer needle was used to cannulate the right common femoral artery without difficulty. An 035 versacore wire was threaded into the abdominal aorta under fluoroscopic guidance. A 5 French sheath was placed over the guidewire in the right common femoral artery. This was thoroughly flushed with heparinized saline. A 5 French pigtail catheter was then placed over the guidewire and these were advanced as a unit into the ascending aorta. An arch aortogram was obtained and 60 LAO projection. This shows normal arch configuration with the innominate left common carotid and left subclavian arteries widely patent. The vertebral arteries are both patent. The left and right common carotid arteries are widely patent. Left and right subclavian arteries are patent. There is significant tortuosity in the subclavian arteries bilaterally. At this point the pigtail catheter was removed over guidewire exchange for a 5 French H1 catheter. This was advanced over the guidewire to select the innominate artery followed by the right subclavian artery. Due to tortuosity of the subclavian artery and this required the patient to rotate her neck up into an AP position and this helped advance the catheter out into the right subclavian artery. A right upper extremity arteriogram was then obtained through the H1 catheter. The  right subclavian artery and axillary artery and brachial artery are widely patent. There is normal configuration of the radial and ulnar arteries. The interosseous artery is diminutive. Initially contrast opacification of the hand was difficult due to dilution of contrast. Therefore the patient's hand was warm with a moist blanket as well as administering 200 g of nitroglycerin intra-arterially. A repeat view of the hand then showed filling of all the digital vessels with early filling of the nodule on the right second finger with a plexus of small arterioles. 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 At this point the H1 catheter was removed over a guidewire and the guidewire was also removed.  The 5 French sheath was flushed thoroughly with heparinized saline and left in place to be pulled in the holding area. The patient tolerated the procedure well and there were no complications.  Operative management: The patient will be scheduled for a repeat office visit with Dr. Bridgett Larsson for further management of the patient's finger nodule.  Ruta Hinds, MD [facility] Office: [phone] Pager: [phone]

## 2015-10-12 NOTE — Interval H&P Note (Signed)
History and Physical Interval Note:  10/12/2015 8:29 AM  Traci Bird  has presented today for surgery, with the diagnosis of Right second finger mass  The various methods of treatment have been discussed with the patient and family. After consideration of risks, benefits and other options for treatment, the patient has consented to  Procedure(s): Upper Extremity Angiography (N/A) as a surgical intervention .  The patient's history has been reviewed, patient examined, no change in status, stable for surgery.  I have reviewed the patient's chart and labs.  Questions were answered to the patient's satisfaction.     Ruta Hinds

## 2015-10-12 NOTE — Progress Notes (Signed)
Site area: RFA Site Prior to Removal:  Level 0 Pressure Applied For:75min Manual: yes   Patient Status During Pull: stable  Post Pull Site:  Level 0 Post Pull Instructions Given: yes  Post Pull Pulses Present: palpable Dressing Applied:  clear Bedrest begins @ 1015 till 1515 Comments:

## 2015-10-12 NOTE — Discharge Instructions (Signed)
Angiogram, Care After °Refer to this sheet in the next few weeks. These instructions provide you with information about caring for yourself after your procedure. Your health care provider may also give you more specific instructions. Your treatment has been planned according to current medical practices, but problems sometimes occur. Call your health care provider if you have any problems or questions after your procedure. °WHAT TO EXPECT AFTER THE PROCEDURE °After your procedure, it is typical to have the following: °· Bruising at the catheter insertion site that usually fades within 1-2 weeks. °· Blood collecting in the tissue (hematoma) that may be painful to the touch. It should usually decrease in size and tenderness within 1-2 weeks. °HOME CARE INSTRUCTIONS °· Take medicines only as directed by your health care provider. °· You may shower 24-48 hours after the procedure or as directed by your health care provider. Remove the bandage (dressing) and gently wash the site with plain soap and water. Pat the area dry with a clean towel. Do not rub the site, because this may cause bleeding. °· Do not take baths, swim, or use a hot tub until your health care provider approves. °· Check your insertion site every day for redness, swelling, or drainage. °· Do not apply powder or lotion to the site. °· Do not lift over 10 lb (4.5 kg) for 5 days after your procedure or as directed by your health care provider. °· Ask your health care provider when it is okay to: °¨ Return to work or school. °¨ Resume usual physical activities or sports. °¨ Resume sexual activity. °· Do not drive home if you are discharged the same day as the procedure. Have someone else drive you. °· You may drive 24 hours after the procedure unless otherwise instructed by your health care provider. °· Do not operate machinery or power tools for 24 hours after the procedure or as directed by your health care provider. °· If your procedure was done as an  outpatient procedure, which means that you went home the same day as your procedure, a responsible adult should be with you for the first 24 hours after you arrive home. °· Keep all follow-up visits as directed by your health care provider. This is important. °SEEK MEDICAL CARE IF: °· You have a fever. °· You have chills. °· You have increased bleeding from the catheter insertion site. Hold pressure on the site. °SEEK IMMEDIATE MEDICAL CARE IF: °· You have unusual pain at the catheter insertion site. °· You have redness, warmth, or swelling at the catheter insertion site. °· You have drainage (other than a small amount of blood on the dressing) from the catheter insertion site. °· The catheter insertion site is bleeding, and the bleeding does not stop after 30 minutes of holding steady pressure on the site. °· The area near or just beyond the catheter insertion site becomes pale, cool, tingly, or numb. °  °This information is not intended to replace advice given to you by your health care provider. Make sure you discuss any questions you have with your health care provider. °  °Document Released: 06/12/2005 Document Revised: 12/15/2014 Document Reviewed: 04/27/2013 °Elsevier Interactive Patient Education ©2016 [employer] ° °

## 2015-10-15 ENCOUNTER — Encounter (HOSPITAL_COMMUNITY): Payer: Self-pay | Admitting: Vascular Surgery

## 2015-10-15 ENCOUNTER — Telehealth: Payer: Self-pay | Admitting: Vascular Surgery

## 2015-10-15 NOTE — Telephone Encounter (Signed)
-----   Message from Denman George, RN sent at 10/12/2015 10:15 AM EDT ----- Regarding: Zigmund Daniel log; also needs appt. next with with [facility] to discuss results   ----- Message -----    From: Elam Dutch, MD    Sent: 10/12/2015   9:44 AM      To: [facility] Charge Pool  Arch aortogram right upper extremity agram 2nd order cath right subclavian, [place] of groin  Pt needs appt with Dr Bridgett Larsson next week to discuss results  Ruta Hinds

## 2015-10-15 NOTE — Telephone Encounter (Signed)
Spoke with pts spouse- he will give her the appt info and if she can't come, he will have her call, dpm

## 2015-10-16 ENCOUNTER — Encounter: Payer: Self-pay | Admitting: Vascular Surgery

## 2015-10-19 ENCOUNTER — Ambulatory Visit (INDEPENDENT_AMBULATORY_CARE_PROVIDER_SITE_OTHER): Payer: 59 | Admitting: Vascular Surgery

## 2015-10-19 ENCOUNTER — Encounter: Payer: Self-pay | Admitting: Vascular Surgery

## 2015-10-19 VITALS — BP 125/87 | HR 86 | Ht 61.0 in | Wt 193.3 lb

## 2015-10-19 DIAGNOSIS — Q2730 Arteriovenous malformation, site unspecified: Secondary | ICD-10-CM

## 2015-10-19 NOTE — Addendum Note (Signed)
Addended by: Dorthula Rue L on: 10/19/2015 02:19 PM   Modules accepted: Orders

## 2015-10-19 NOTE — Progress Notes (Signed)
    Postoperative Visit   History of Present Illness  Traci Bird is a 54 y.o. female who presents for postoperative follow-up from procedure on Date: 10/12/15 R arm angiogram.  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  The patient is able to complete their activities of daily living.  The patient's current symptoms are: compressive sx overlying that joint when typing.  Pt denies any bleeding..  Past Medical History, Past Surgical History, Social History, Family History, Medications, Allergies, and Review of Systems are unchanged from previous evaluation on 10/12/15  On ROS: no hand numbness, no bleeding from AVM.  For VQI Use Only  PRE-ADM LIVING: Home  AMB STATUS: Ambulatory  Physical Examination  Filed Vitals:   10/19/15 1037  BP: 125/87  Pulse: 86  Height: 5\' 1"  (1.549 m)  Weight: 193 lb 4.8 oz (87.68 kg)  SpO2: 95%   Body mass index is 36.54 kg/(m^2).  General: A&O x 3, WDWN  Pulmonary: Sym exp, good air movt, CTAB, no rales, rhonchi, & wheezing  Cardiac: RRR, Nl S1, S2, no Murmurs, rubs or gallops  Vascular:  Palpable right radial pulse, faintly ulnar pulse, palpable pulse in AVM  Gastrointestinal: soft, NTND, -G/R, - HSM, - masses, - CVAT B  Musculoskeletal: M/S 5/5 throughout , Extremities without ischemic changes , R groin without hematoma, small amount of echymosis present at cannulation site  Neurologic:  Pain and light touch intact in extremities , Motor exam as listed above  Medical Decision Making  Traci Bird is a 54 y.o. female who presents s/p R arm and hand angiogram suggestive of R hand AVM.Marland Kitchen  Based on his angiographic findings, this patient needs: referral to Hand[facility]Microsurgery at [facility] for further evaluation. Clearly the AVM is feeding 1 out 2 digital arteries to R 2nd finger.  I have concerns that embolization or ligating the AVM might lead to ischemia and eventual amputation of  that finger, despite having another digital artery supplied from the palmar arch.  My office will arrange an appointment with Dr. Roberts Gaudy at [facility] for further evaluation  Thank you for allowing [place] to participate in this patient's care.  Adele Barthel, MD [facility] Office: [phone] Pager: [phone]

## 2016-07-28 ENCOUNTER — Encounter: Payer: Self-pay | Admitting: Gastroenterology

## 2017-07-04 ENCOUNTER — Encounter (HOSPITAL_COMMUNITY): Payer: Self-pay | Admitting: Emergency Medicine

## 2017-07-04 ENCOUNTER — Emergency Department (HOSPITAL_COMMUNITY)
Admission: EM | Admit: 2017-07-04 | Discharge: 2017-07-04 | Disposition: A | Discharge status: Home / Self Care | Payer: 59 | Attending: Emergency Medicine | Admitting: Emergency Medicine

## 2017-07-04 ENCOUNTER — Emergency Department (HOSPITAL_COMMUNITY): Payer: 59

## 2017-07-04 DIAGNOSIS — S0003XA Contusion of scalp, initial encounter: Secondary | ICD-10-CM | POA: Diagnosis not present

## 2017-07-04 DIAGNOSIS — W1830XA Fall on same level, unspecified, initial encounter: Secondary | ICD-10-CM | POA: Insufficient documentation

## 2017-07-04 DIAGNOSIS — M79661 Pain in right lower leg: Secondary | ICD-10-CM | POA: Diagnosis not present

## 2017-07-04 DIAGNOSIS — Y9301 Activity, walking, marching and hiking: Secondary | ICD-10-CM | POA: Diagnosis not present

## 2017-07-04 DIAGNOSIS — Y999 Unspecified external cause status: Secondary | ICD-10-CM | POA: Diagnosis not present

## 2017-07-04 DIAGNOSIS — Y92007 Garden or yard of unspecified non-institutional (private) residence as the place of occurrence of the external cause: Secondary | ICD-10-CM | POA: Insufficient documentation

## 2017-07-04 DIAGNOSIS — M545 Low back pain: Secondary | ICD-10-CM | POA: Diagnosis not present

## 2017-07-04 DIAGNOSIS — R51 Headache: Secondary | ICD-10-CM | POA: Diagnosis not present

## 2017-07-04 DIAGNOSIS — Z79899 Other long term (current) drug therapy: Secondary | ICD-10-CM | POA: Diagnosis not present

## 2017-07-04 DIAGNOSIS — E119 Type 2 diabetes mellitus without complications: Secondary | ICD-10-CM | POA: Diagnosis not present

## 2017-07-04 DIAGNOSIS — M7918 Myalgia, other site: Secondary | ICD-10-CM

## 2017-07-04 DIAGNOSIS — S0990XA Unspecified injury of head, initial encounter: Secondary | ICD-10-CM | POA: Diagnosis present

## 2017-07-04 IMAGING — CT CT CERVICAL SPINE W/O CM
4 of 8 series · 12 of 33 positions shown, 13 images · non-contrast
Comparison: None.

CLINICAL DATA: Post fall with trauma to the back of the head.

EXAM:
CT HEAD WITHOUT CONTRAST
CT CERVICAL SPINE WITHOUT CONTRAST
TECHNIQUE: Multidetector CT imaging of the head and cervical spine was
performed following the standard protocol without intravenous
contrast. Multiplanar CT image reconstructions of the cervical spine
were also generated.

[Series 8: c-spine st · axial · 0.23mm/px · z∈[+1566,+1654]mm · 3 of 90 slices shown]
[im 23/90  bone]
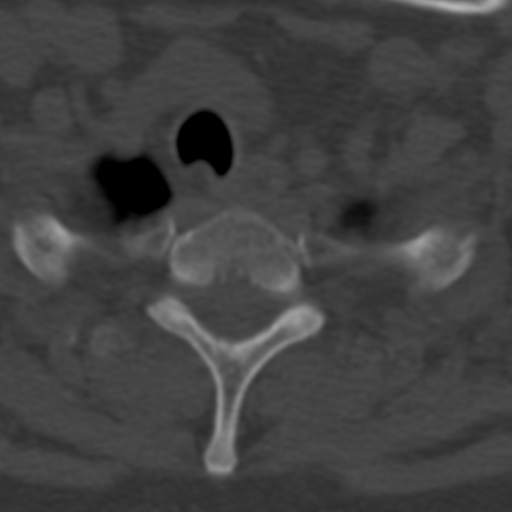
[im 45/90  bone]
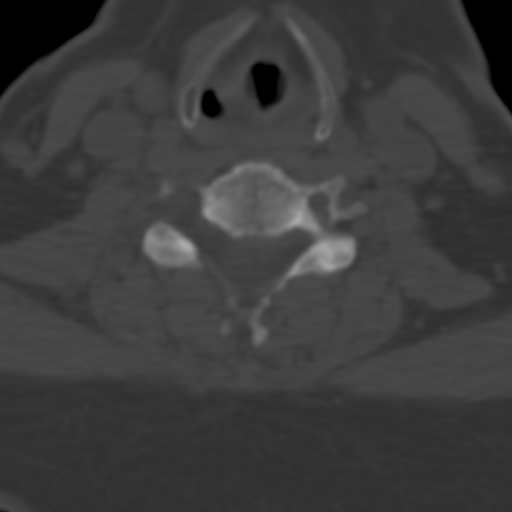
[im 67/90  bone]
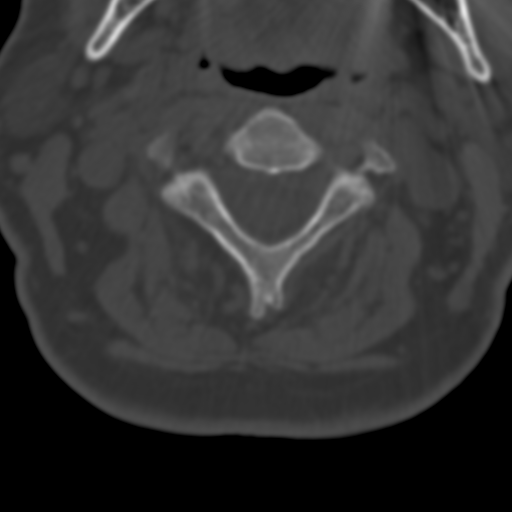

[Series 11: axial recon · axial · 0.23mm/px · z∈[+1545,+1648]mm · 3 of 107 slices shown, 4 images]
[im 27/107  soft-tissue]
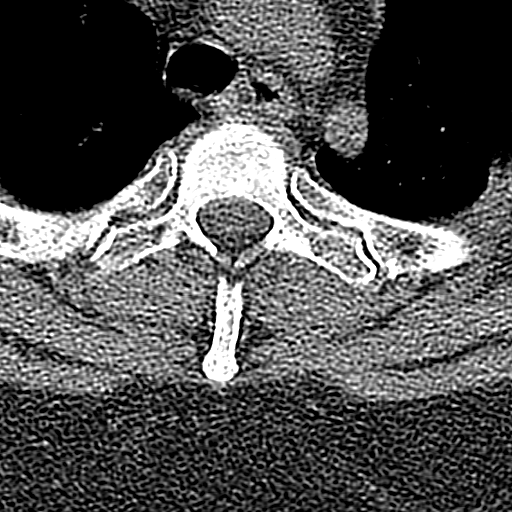
[im 27/107  bone]
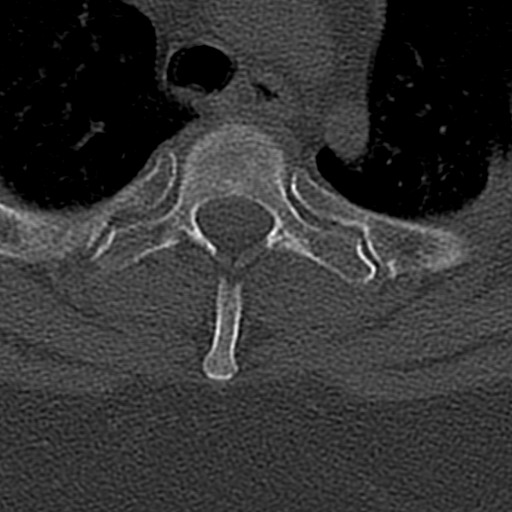
[im 54/107  bone]
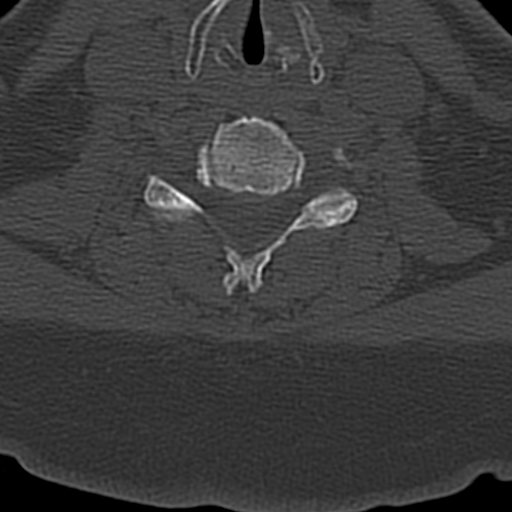
[im 80/107  bone]
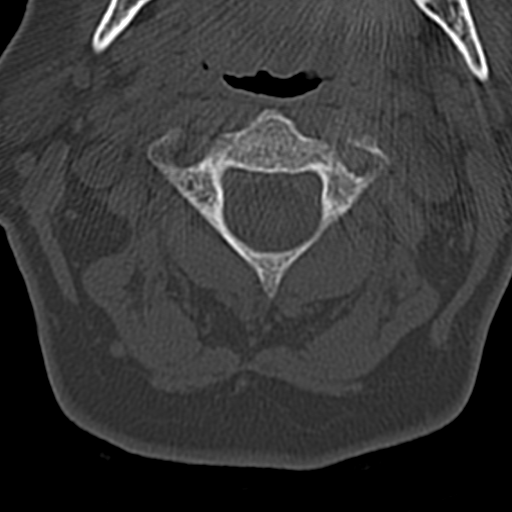

[Series 12: coronal · coronal · 0.23mm/px · 1 of 61 slices shown]
[im 31/61  bone]
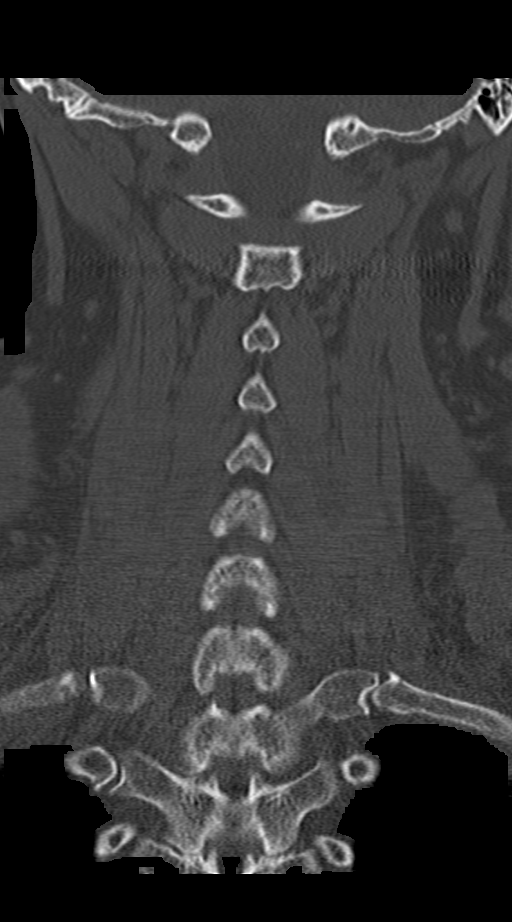

[Series 13: sagittal · sagittal · 0.23mm/px · 5 of 61 slices shown]
[im 11/61  bone]
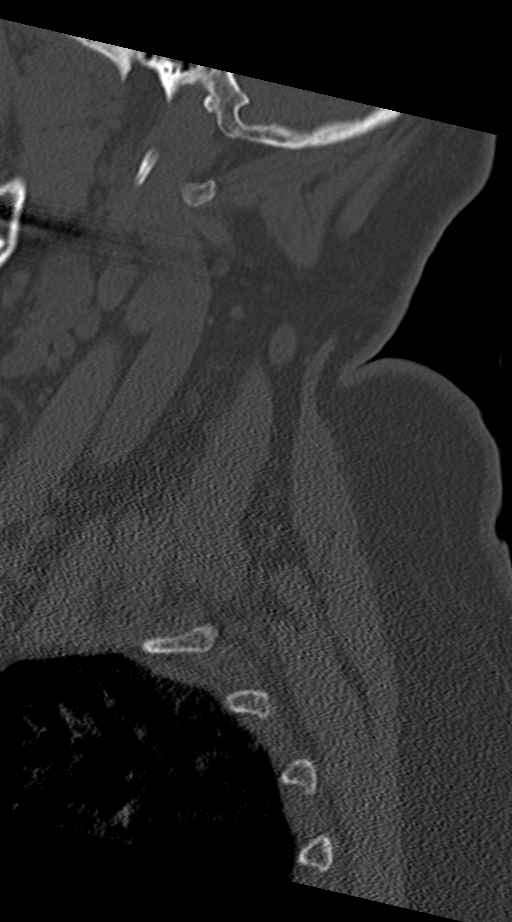
[im 21/61  bone]
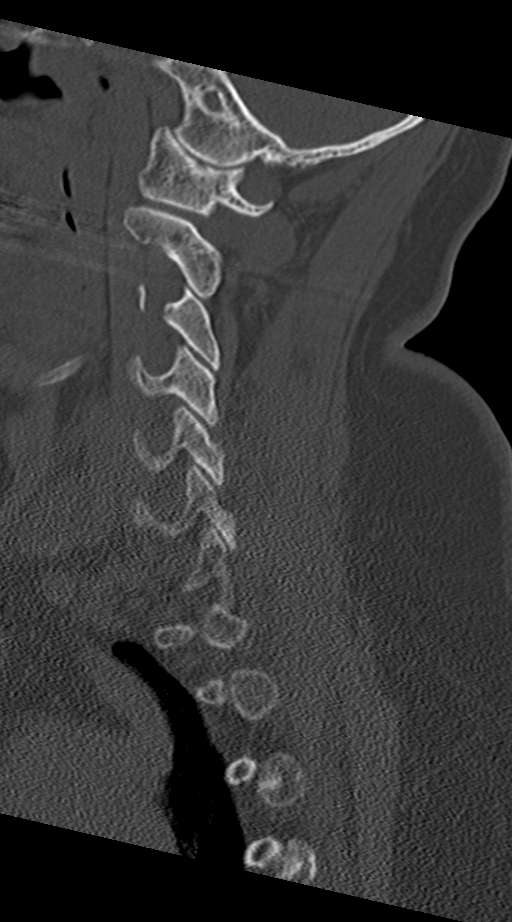
[im 31/61  bone]
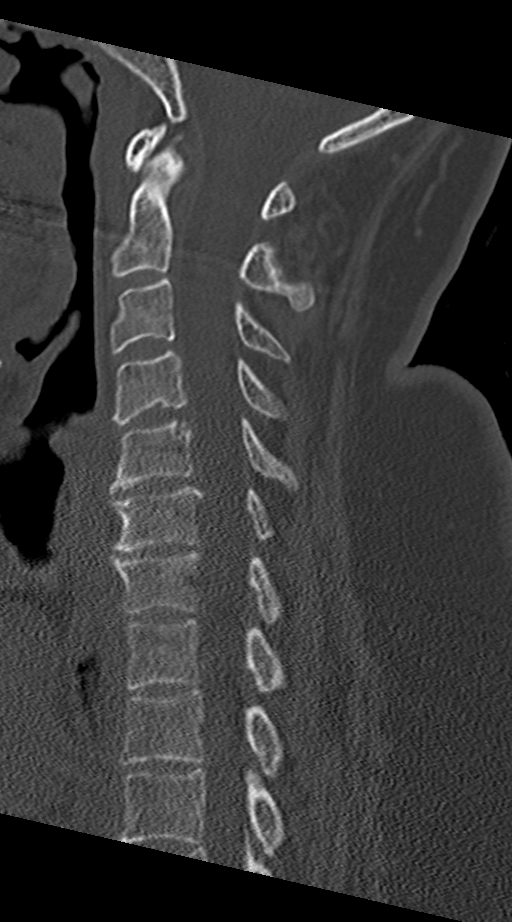
[im 41/61  bone]
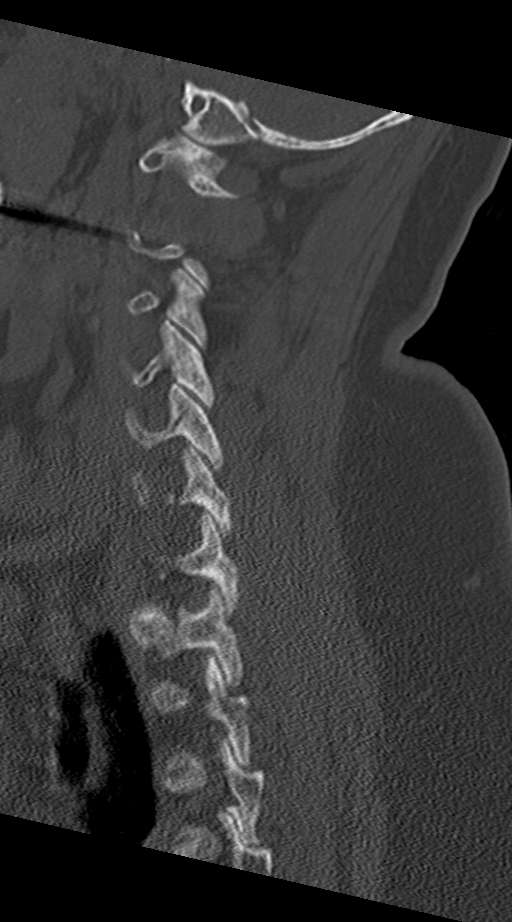
[im 51/61  bone]
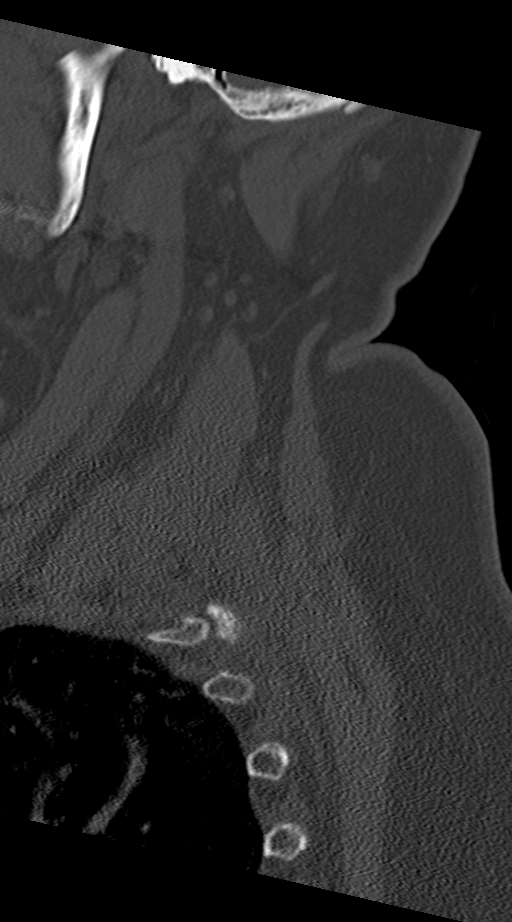

[12 of 33 positions shown; findings below may reference images not displayed]

FINDINGS: CT HEAD FINDINGS

Brain: No evidence of acute infarction, hemorrhage, hydrocephalus,
extra-axial collection or mass lesion/mass effect.

Vascular: No hyperdense vessel or unexpected calcification.

Skull: Normal. Negative for fracture or focal lesion.

Sinuses/Orbits: No acute finding.

Other: Right parietal scalp hematoma.

CT CERVICAL SPINE FINDINGS

Alignment: Straightening of cervical lordosis.

Skull base and vertebrae: No acute fracture. No primary bone lesion
or focal pathologic process.

Soft tissues and spinal canal: No prevertebral fluid or swelling. No
visible canal hematoma.

Disc levels: Multilevel osteoarthritic changes, worse at C4-C5,
C5-C6 and C6-C7.

Upper chest: Negative.

Other: None.
IMPRESSION: No acute intracranial abnormality.

Right parietal scalp hematoma.

No evidence of acute traumatic injury to the cervical spine.

Multilevel osteoarthritic changes of the cervical spine.

## 2017-07-04 MED ORDER — METHOCARBAMOL 500 MG PO TABS: 500.0000 mg | ORAL_TABLET | Freq: Three times a day (TID) | ORAL | 0 refills | Status: DC | PRN

## 2017-07-04 MED ORDER — ONDANSETRON 4 MG PO TBDP
4.0000 mg | ORAL_TABLET | Freq: Once | ORAL | Status: AC
  Administered 2017-07-04: 4 mg via ORAL
  Filled 2017-07-04: qty 1

## 2017-07-04 MED ORDER — HYDROCODONE-ACETAMINOPHEN 5-325 MG PO TABS
2.0000 | ORAL_TABLET | Freq: Once | ORAL | Status: AC
  Administered 2017-07-04: 2 via ORAL
  Filled 2017-07-04: qty 2

## 2017-07-04 NOTE — ED Triage Notes (Signed)
Patient here with complaints of fall today. Reports lower back pain, right leg pain radiating down into knee. Also reports hitting back of head during fall. Denies LOC. Nausea, no vomiting.

## 2017-07-05 NOTE — ED Provider Notes (Signed)
[facility] DEPT Provider Note   CSN: [id number] Arrival date & time: 07/04/17  1746     History   Chief Complaint Chief Complaint  Patient presents with  . Fall  . Back Pain  . Head Injury    HPI Traci Bird is a 56 y.o. female.  HPI   56 yo F with PMHx as below here with fall. Pt was walking outside her house today when she slipped on the ground, falling. She struck the back of her head against a brick wall, then fell onto her knees. She reports associated moderate aching, throbbing headache, mild lower back pain along the left and right sides, as well as her bilateral knees. No numbness or weakness. NO loss of consciousness. No headache. No vision changes. She is not on blood thinners. She was in her usual state of health before the fall today.  Past Medical History:  Diagnosis Date  . Acute meniscal tear of knee LEFT  . Anxiety   . Arthritis HIPS , KNEES, HANDS  . Chondromalacia of left knee   . Depression   . History of diabetes mellitus NO ISSUE SINCE GASTRIC BYPASS  . History of sleep apnea NO ISSUES SINCE GASTRIC BYPASS  . Hypothyroidism     Patient Active Problem List   Diagnosis Date Noted  . Mass of right finger 10/03/2015  . AVM (arteriovenous malformation) 10/03/2015  . 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 ABDOMINAL HYSTERECTOMY  11-06-2005  DR HENLEY   W/ BILATERAL SALPINGO-OOPHORECTOMY  . bilateral breast reduction  1992  . GASTRIC BYPASS  2008  . HYSTEROSCOPY W/D&C  05-16-2005  DR HENLEY   POLYPECTOMY  . JOINT REPLACEMENT Left March 22, 2015   Knee  . KNEE ARTHROSCOPY  10/29/2012   Procedure: ARTHROSCOPY KNEE;  Surgeon: Johnn Hai, MD;  Location: [facility];  Service: Orthopedics;  Laterality: Left;  WITH DEBRIDEMENT  . KNEE ARTHROSCOPY WITH LATERAL RELEASE  10/29/2012   Procedure: KNEE ARTHROSCOPY WITH LATERAL RELEASE;  Surgeon: Johnn Hai, MD;  Location: [facility];  Service: Orthopedics;  Laterality: Left;  . NEUROPLASTY / TRANSPOSITION MEDIAN NERVE AT CARPAL TUNNEL BILATERAL  2002  . partial thyoidectomy  1994  . PERIPHERAL VASCULAR CATHETERIZATION N/A 10/12/2015   Procedure: Upper Extremity Angiography;  Surgeon: Elam Dutch, MD;  Location: [facility];  Service: Cardiovascular;  Laterality: N/A;  . SEPTOPLASTY  1987  . tendon repair heel  2011   left  . TONSILLECTOMY AND ADENOIDECTOMY  1966  . TOTAL KNEE ARTHROPLASTY Left 03/22/2015   Procedure: LEFT TOTAL KNEE ARTHROPLASTY;  Surgeon: Susa Day, MD;  Location: [facility];  Service: Orthopedics;  Laterality: Left;  . TUBAL LIGATION  1984    OB History    No data available       Home Medications    Prior to Admission medications   Medication Sig Start Date End Date Taking? Authorizing Provider  acetaminophen (TYLENOL) 500 MG tablet Take 500 mg by mouth every 6 (six) hours as needed for mild pain.   Yes [provider]  ALPRAZolam (XANAX) 0.25 MG tablet Take 0.25 mg by mouth at bedtime as needed for anxiety.   Yes [provider]  busPIRone (BUSPAR) 15 MG tablet Take 15 mg by mouth daily.   Yes [provider]  citalopram (CELEXA) 20 MG tablet Take 30 mg by mouth at bedtime.  Yes [provider]  levothyroxine (SYNTHROID, LEVOTHROID) 75 MCG tablet Take 75 mcg by mouth every evening.  09/09/15  Yes [provider]  oxyCODONE-acetaminophen (PERCOCET) 10-325 MG tablet Take 1 tablet by mouth every 6 (six) hours as needed for pain.  09/11/15  Yes [provider]  prochlorperazine (COMPAZINE) 10 MG tablet Take 10 mg by mouth every 6 (six) hours as needed for nausea or vomiting.   Yes [provider]  zolpidem (AMBIEN) 5 MG tablet Take 5 mg by mouth at bedtime as needed for sleep.    Yes [provider]  methocarbamol (ROBAXIN) 500 MG tablet Take 1 tablet (500 mg total) by mouth every 8 (eight) hours as  needed for muscle spasms. 07/04/17   Duffy Bruce, MD    Family History Family History  Problem Relation Age of Onset  . Diabetes Mother   . Hyperlipidemia Mother   . Hypertension Mother   . Diabetes Father   . Hyperlipidemia Father   . Heart disease Father        CAD- Open Heart  . Colon cancer Neg Hx   . Stomach cancer Neg Hx     Social History Social History  Substance Use Topics  . Smoking status: Never Smoker  . Smokeless tobacco: Never Used  . Alcohol use No     Allergies   Morphine and related and Nsaids   Review of Systems Review of Systems  Constitutional: Negative for chills and fever.  HENT: Negative for congestion, rhinorrhea and sore throat.   Eyes: Negative for visual disturbance.  Respiratory: Negative for cough, shortness of breath and wheezing.   Cardiovascular: Negative for chest pain and leg swelling.  Gastrointestinal: Negative for abdominal pain, diarrhea, nausea and vomiting.  Genitourinary: Negative for dysuria, flank pain, vaginal bleeding and vaginal discharge.  Musculoskeletal: Positive for arthralgias and gait problem. Negative for neck pain.  Skin: Negative for rash.  Allergic/Immunologic: Negative for immunocompromised state.  Neurological: Positive for headaches. Negative for syncope.  Hematological: Does not bruise/bleed easily.  All other systems reviewed and are negative.    Physical Exam Updated Vital Signs BP 131/79 (BP Location: Left Arm)   Pulse 77   Temp 98.2 F (36.8 C) (Oral)   Resp 14   SpO2 100%   Physical Exam  Constitutional: She is oriented to person, place, and time. She appears well-developed and well-nourished. No distress.  HENT:  Head: Normocephalic and atraumatic.  Moderate hematoma to right parietal scalp. No overlying skin abrasions or lacerations. Mild TTP over hematoma. No palpable deformity or crepitance.  Eyes: Conjunctivae are normal.  Neck: Neck supple.  Cardiovascular: Normal rate, regular  rhythm and normal heart sounds.  Exam reveals no friction rub.   No murmur heard. Pulmonary/Chest: Effort normal and breath sounds normal. No respiratory distress. She has no wheezes. She has no rales.  Abdominal: She exhibits no distension.  Musculoskeletal: She exhibits tenderness (moderate bilateral knee pain and TTP. No deformity or bruising. Bruising to right anterior shin, no bony deformity.). She exhibits no edema.  Neurological: She is alert and oriented to person, place, and time. She exhibits normal muscle tone.  Skin: Skin is warm. Capillary refill takes less than 2 seconds.  Psychiatric: She has a normal mood and affect.  Nursing note and vitals reviewed.    ED Treatments / Results  Labs (all labs ordered are listed, but only abnormal results are displayed) Labs Reviewed - No data to display  EKG  EKG Interpretation None  Radiology Dg Chest 2 View  Result Date: 07/04/2017 CLINICAL DATA:  Patient fell today striking head on brick. Low back pain and bilateral knee pain radiating down the right leg. History of left knee surgery. Diabetes. Nonsmoker. EXAM: CHEST  2 VIEW COMPARISON:  01/15/2009 FINDINGS: Shallow inspiration. Normal heart size and pulmonary vascularity. No focal airspace disease or consolidation in the lungs. No blunting of costophrenic angles. No pneumothorax. Mediastinal contours appear intact. IMPRESSION: No active cardiopulmonary disease. Electronically Signed   By: Lucienne Capers M.D.   On: 07/04/2017 19:39   Dg Lumbar Spine Complete  Result Date: 07/04/2017 CLINICAL DATA:  Pt states fall today and hit head on a brick. Pt c/o lower back pain and bilateral knee pain with pain radiating down right leg. Hx left knee surgery. Diabetic, non-smoker. EXAM: LUMBAR SPINE - COMPLETE 4+ VIEW COMPARISON:  CT myelogram lumbar spine 09/30/2013. FINDINGS: Normal alignment of the lumbar spine. Degenerative changes throughout with narrowed interspaces and endplate  hypertrophic changes. No vertebral compression deformities. No focal bone lesion or bone destruction. Degenerative changes in the lower lumbar facet joints. Postoperative changes in the left upper quadrant. IMPRESSION: Mild degenerative changes in the lumbar spine. No acute displaced fractures identified. Electronically Signed   By: Lucienne Capers M.D.   On: 07/04/2017 19:40   Dg Tibia/fibula Right  Result Date: 07/04/2017 CLINICAL DATA:  Pt states fall today and hit head on a brick. Pt c/o lower back pain and bilateral knee pain with pain radiating down right leg. Hx left knee surgery. Diabetic, non-smoker. EXAM: RIGHT TIBIA AND FIBULA - 2 VIEW COMPARISON:  None. FINDINGS: There is no evidence of fracture or other focal bone lesions. Soft tissues are unremarkable. IMPRESSION: Negative. Electronically Signed   By: Lucienne Capers M.D.   On: 07/04/2017 19:52   Ct Head Wo Contrast  Result Date: 07/04/2017 CLINICAL DATA:  Post fall with trauma to the back of the head. EXAM: CT HEAD WITHOUT CONTRAST CT CERVICAL SPINE WITHOUT CONTRAST TECHNIQUE: Multidetector CT imaging of the head and cervical spine was performed following the standard protocol without intravenous contrast. Multiplanar CT image reconstructions of the cervical spine were also generated. COMPARISON:  None. FINDINGS: CT HEAD FINDINGS Brain: No evidence of acute infarction, hemorrhage, hydrocephalus, extra-axial collection or mass lesion/mass effect. Vascular: No hyperdense vessel or unexpected calcification. Skull: Normal. Negative for fracture or focal lesion. Sinuses/Orbits: No acute finding. Other: Right parietal scalp hematoma. CT CERVICAL SPINE FINDINGS Alignment: Straightening of cervical lordosis. Skull base and vertebrae: No acute fracture. No primary bone lesion or focal pathologic process. Soft tissues and spinal canal: No prevertebral fluid or swelling. No visible canal hematoma. Disc levels: Multilevel osteoarthritic changes, worse  at C4-C5, C5-C6 and C6-C7. Upper chest: Negative. Other: None. IMPRESSION: No acute intracranial abnormality. Right parietal scalp hematoma. No evidence of acute traumatic injury to the cervical spine. Multilevel osteoarthritic changes of the cervical spine. Electronically Signed   By: Fidela Salisbury M.D.   On: 07/04/2017 19:38   Ct Cervical Spine Wo Contrast  Result Date: 07/04/2017 CLINICAL DATA:  Post fall with trauma to the back of the head. EXAM: CT HEAD WITHOUT CONTRAST CT CERVICAL SPINE WITHOUT CONTRAST TECHNIQUE: Multidetector CT imaging of the head and cervical spine was performed following the standard protocol without intravenous contrast. Multiplanar CT image reconstructions of the cervical spine were also generated. COMPARISON:  None. FINDINGS: CT HEAD FINDINGS Brain: No evidence of acute infarction, hemorrhage, hydrocephalus, extra-axial collection or mass lesion/mass effect. Vascular: No hyperdense  vessel or unexpected calcification. Skull: Normal. Negative for fracture or focal lesion. Sinuses/Orbits: No acute finding. Other: Right parietal scalp hematoma. CT CERVICAL SPINE FINDINGS Alignment: Straightening of cervical lordosis. Skull base and vertebrae: No acute fracture. No primary bone lesion or focal pathologic process. Soft tissues and spinal canal: No prevertebral fluid or swelling. No visible canal hematoma. Disc levels: Multilevel osteoarthritic changes, worse at C4-C5, C5-C6 and C6-C7. Upper chest: Negative. Other: None. IMPRESSION: No acute intracranial abnormality. Right parietal scalp hematoma. No evidence of acute traumatic injury to the cervical spine. Multilevel osteoarthritic changes of the cervical spine. Electronically Signed   By: Fidela Salisbury M.D.   On: 07/04/2017 19:38   Dg Knee Complete 4 Views Left  Result Date: 07/04/2017 CLINICAL DATA:  Pt states fall today and hit head on a brick. Pt c/o lower back pain and bilateral knee pain with pain radiating down  right leg. Hx left knee surgery. Diabetic, non-smoker. EXAM: LEFT KNEE - COMPLETE 4+ VIEW COMPARISON:  03/22/2015 FINDINGS: Previous left total knee arthroplasty including patellar femoral component. Since the prior study, there is increased lucency around the posterior aspect of the femoral component at the bone-cement interface which could indicate early loosening. No evidence of acute fracture or dislocation. No focal bone lesion or bone destruction. No significant effusion. IMPRESSION: No acute fracture or dislocation identified. Left total knee arthroplasty. Increased lucency at the bone cement interface of the posterior aspect of the femoral component may indicate early loosening. Electronically Signed   By: Lucienne Capers M.D.   On: 07/04/2017 19:55   Dg Knee Complete 4 Views Right  Result Date: 07/04/2017 CLINICAL DATA:  Pt states fall today and hit head on a brick. Pt c/o lower back pain and bilateral knee pain with pain radiating down right leg. Hx left knee surgery. Diabetic, non-smoker. EXAM: RIGHT KNEE - COMPLETE 4+ VIEW COMPARISON:  None. FINDINGS: Degenerative changes in the right knee with mild compartment narrowing and osteophyte formation. No evidence of acute fracture or dislocation. No focal bone lesion or bone destruction. No significant effusion. IMPRESSION: Mild degenerative changes in the right knee. No acute bony abnormalities. Electronically Signed   By: Lucienne Capers M.D.   On: 07/04/2017 19:41    Procedures Procedures (including critical care time)  Medications Ordered in ED Medications  HYDROcodone-acetaminophen (NORCO/VICODIN) 5-325 MG per tablet 2 tablet (2 tablets Oral Given 07/04/17 1936)  ondansetron (ZOFRAN-ODT) disintegrating tablet 4 mg (4 mg Oral Given 07/04/17 1936)     Initial Impression / Assessment and Plan / ED Course  I have reviewed the triage vital signs and the nursing notes.  Pertinent labs & imaging results that were available during my care of  the patient were reviewed by me and considered in my medical decision making (see chart for details).     56 yo F with chronic pain here with headache, knee pain s/p mechanical fall. Pt was o/w at baseline state of health until fall. No LOC. No blood thinner use. Imaging is negative for acute abnormality and pt can ambulate, with mild pain only. Will d/c with supportive care. No signs of significant spinal injury. No LE weakness or numbness, no evidence of cauda equina.  Final Clinical Impressions(s) / ED Diagnoses   Final diagnoses:  Contusion of scalp, initial encounter  Musculoskeletal pain    New Prescriptions Discharge Medication List as of 07/04/2017  9:08 PM       Duffy Bruce, MD 07/05/17 1257

## 2020-03-19 ENCOUNTER — Other Ambulatory Visit: Payer: Self-pay | Admitting: Orthopedic Surgery

## 2020-03-19 DIAGNOSIS — T8484XA Pain due to internal orthopedic prosthetic devices, implants and grafts, initial encounter: Secondary | ICD-10-CM

## 2020-03-22 ENCOUNTER — Other Ambulatory Visit: Payer: Self-pay | Admitting: Orthopedic Surgery

## 2020-03-22 DIAGNOSIS — T8484XA Pain due to internal orthopedic prosthetic devices, implants and grafts, initial encounter: Secondary | ICD-10-CM

## 2020-04-04 ENCOUNTER — Ambulatory Visit
Admission: RE | Admit: 2020-04-04 | Discharge: 2020-04-04 | Disposition: A | Discharge status: Home / Self Care | Payer: 59 | Source: Ambulatory Visit | Attending: Orthopedic Surgery | Admitting: Orthopedic Surgery

## 2020-04-04 DIAGNOSIS — T8484XA Pain due to internal orthopedic prosthetic devices, implants and grafts, initial encounter: Secondary | ICD-10-CM

## 2020-04-16 ENCOUNTER — Other Ambulatory Visit: Payer: Self-pay

## 2020-04-16 ENCOUNTER — Encounter: Payer: Self-pay | Admitting: Neurology

## 2020-04-16 ENCOUNTER — Ambulatory Visit: Payer: 59 | Admitting: Neurology

## 2020-04-16 VITALS — BP 105/72 | HR 81 | Temp 96.8°F | Ht 61.5 in | Wt 173.0 lb

## 2020-04-16 DIAGNOSIS — F450 Somatization disorder: Secondary | ICD-10-CM

## 2020-04-16 DIAGNOSIS — F32A Depression, unspecified: Secondary | ICD-10-CM | POA: Insufficient documentation

## 2020-04-16 DIAGNOSIS — R251 Tremor, unspecified: Secondary | ICD-10-CM | POA: Insufficient documentation

## 2020-04-16 DIAGNOSIS — R269 Unspecified abnormalities of gait and mobility: Secondary | ICD-10-CM | POA: Diagnosis not present

## 2020-04-16 DIAGNOSIS — F329 Major depressive disorder, single episode, unspecified: Secondary | ICD-10-CM | POA: Diagnosis not present

## 2020-04-16 DIAGNOSIS — Z96652 Presence of left artificial knee joint: Secondary | ICD-10-CM | POA: Insufficient documentation

## 2020-04-16 DIAGNOSIS — Z8659 Personal history of other mental and behavioral disorders: Secondary | ICD-10-CM | POA: Insufficient documentation

## 2020-04-16 DIAGNOSIS — Z9889 Other specified postprocedural states: Secondary | ICD-10-CM | POA: Insufficient documentation

## 2020-04-16 NOTE — Progress Notes (Signed)
Referred to :  Traci Bird, M D  Referring Provider:  Ramonita Lab, MD at [facility].     HPI:  Traci Bird is a 59 y.o. female patient who has been in orthopedic treatment, pain treatment and reports suffering form a problem with knee stability,  seen here as a referral/ revisit  from Dr. Corine Bird.   pt with husband, rm 7. presents today for balance concerns, states that she will be walking down straight hall and next thing she knows she will walk into wall. predominetely leans to the left but can happen to the right as well. If her husband or other family members don't help her with ambulating she would end up falling. Sees a psychologist for severe anxiety and depression. no imaging of brain   Traci Bird has a history of multiple joint problems but it seems predominantly that her knees are giving her trouble. She had a total knee replacement in April 2011 affecting the left knee, and recently had another surgery as of July 2020 for the right knee, but this was not a total knee replacement, just an arthroscopy. Her problems are managing stairs, she reports she cannot lift her feet well, and she  feels her left leg is instable, while the right is no longer buckling since arthroscopy was done.  Her gait instability is not new- and worsened over the last 2 years- and the same time she denies history of stroke, concussion.     Review of Systems: Out of a complete 14 system review, the patient complains of only the following symptoms, and all other reviewed systems are negative.  long history of psychiatric problems- amazingly high doses of medication, many sedating.   Social History   Socioeconomic History  . Marital status: Married    Spouse name: Not on file  . Number of children: Not on file  . Years of education: Not on file  . Highest education level: Not on file  Occupational History  . Not on file  Tobacco Use  . Smoking status: Never Smoker  . Smokeless  tobacco: Never Used  Substance and Sexual Activity  . Alcohol use: No  . Drug use: No  . Sexual activity: Not on file  Other Topics Concern  . Not on file  Social History Narrative  . Not on file   Social Determinants of Health   Financial Resource Strain:   . Difficulty of Paying Living Expenses:   Food Insecurity:   . Worried About Charity fundraiser in the Last Year:   . Arboriculturist in the Last Year:   Transportation Needs:   . Film/video editor (Medical):   Marland Kitchen Lack of Transportation (Non-Medical):   Physical Activity:   . Days of Exercise per Week:   . Minutes of Exercise per Session:   Stress:   . Feeling of Stress :   Social Connections:   . Frequency of Communication with Friends and Family:   . Frequency of Social Gatherings with Friends and Family:   . Attends Religious Services:   . Active Member of Clubs or Organizations:   . Attends Archivist Meetings:   Marland Kitchen Marital Status:   Intimate Partner Violence:   . Fear of Current or Ex-Partner:   . Emotionally Abused:   Marland Kitchen Physically Abused:   . Sexually Abused:     Family History  Problem Relation Age of Onset  . Diabetes Mother   .  Hyperlipidemia Mother   . Hypertension Mother   . Diabetes Father   . Hyperlipidemia Father   . Heart disease Father        CAD- Open Heart  . Colon cancer Neg Hx   . Stomach cancer Neg Hx     Past Medical History:  Diagnosis Date  . Acute meniscal tear of knee LEFT  . Anxiety   . Arthritis HIPS , KNEES, HANDS  . Chondromalacia of left knee   . Depression   . History of diabetes mellitus NO ISSUE SINCE GASTRIC BYPASS  . History of sleep apnea NO ISSUES SINCE GASTRIC BYPASS  . Hypothyroidism     Past Surgical History:  Procedure Laterality Date  . ABDOMINAL HYSTERECTOMY  11-06-2005  DR HENLEY   W/ BILATERAL SALPINGO-OOPHORECTOMY  . bilateral breast reduction  1992  . GASTRIC BYPASS  2008  . HYSTEROSCOPY WITH D & C  05-16-2005  DR HENLEY    POLYPECTOMY  . JOINT REPLACEMENT Left March 22, 2015   Knee  . KNEE ARTHROSCOPY  10/29/2012   Procedure: ARTHROSCOPY KNEE;  Surgeon: Johnn Hai, MD;  Location: [facility];  Service: Orthopedics;  Laterality: Left;  WITH DEBRIDEMENT  . KNEE ARTHROSCOPY WITH LATERAL RELEASE  10/29/2012   Procedure: KNEE ARTHROSCOPY WITH LATERAL RELEASE;  Surgeon: Johnn Hai, MD;  Location: [place];  Service: Orthopedics;  Laterality: Left;  . NEUROPLASTY / TRANSPOSITION MEDIAN NERVE AT CARPAL TUNNEL BILATERAL  2002  . partial thyoidectomy  1994  . PERIPHERAL VASCULAR CATHETERIZATION N/A 10/12/2015   Procedure: Upper Extremity Angiography;  Surgeon: Elam Dutch, MD;  Location: [facility];  Service: Cardiovascular;  Laterality: N/A;  . SEPTOPLASTY  1987  . tendon repair heel  2011   left  . TONSILLECTOMY AND ADENOIDECTOMY  1966  . TOTAL KNEE ARTHROPLASTY Left 03/22/2015   Procedure: LEFT TOTAL KNEE ARTHROPLASTY;  Surgeon: Susa Day, MD;  Location: [facility];  Service: Orthopedics;  Laterality: Left;  . TUBAL LIGATION  1984    Current Outpatient Medications  Medication Sig Dispense Refill  . acetaminophen (TYLENOL) 500 MG tablet Take 500 mg by mouth every 6 (six) hours as needed for mild pain.    . busPIRone (BUSPAR) 15 MG tablet Take 15 mg by mouth daily.    . clonazePAM (KLONOPIN) 0.5 MG tablet Take 0.5 mg by mouth 2 (two) times daily as needed.    . gabapentin (NEURONTIN) 600 MG tablet Take 600 mg by mouth at bedtime as needed.    Marland Kitchen levothyroxine (SYNTHROID, LEVOTHROID) 75 MCG tablet Take 75 mcg by mouth every evening.     Marland Kitchen oxyCODONE-acetaminophen (PERCOCET) 10-325 MG tablet Take 1 tablet by mouth every 6 (six) hours as needed for pain.     Marland Kitchen prochlorperazine (COMPAZINE) 10 MG tablet Take 10 mg by mouth every 6 (six) hours as needed for nausea or vomiting.    . sertraline (ZOLOFT) 100 MG tablet Take 200 mg by mouth daily.    Marland Kitchen tiZANidine (ZANAFLEX)  4 MG tablet Take 4 mg by mouth at bedtime as needed.    . trazodone (DESYREL) 300 MG tablet Take 300 mg by mouth at bedtime.    . Vitamin D, Ergocalciferol, (DRISDOL) 1.25 MG (50000 UNIT) CAPS capsule Take 50,000 Units by mouth once a week.     No current facility-administered medications for this visit.    Allergies as of 04/16/2020 - Review Complete 04/16/2020  Allergen Reaction Noted  .  Morphine and related Itching 10/15/2011  . Nsaids Other (See Comments) 10/12/2015    Vitals: BP 105/72   Pulse 81   Temp (!) 96.8 F (36 C)   Ht 5' 1.5" (1.562 m)   Wt 173 lb (78.5 kg)   BMI 32.16 kg/m  Last Weight:  Wt Readings from Last 1 Encounters:  04/16/20 173 lb (78.5 kg)   Last Height:   Ht Readings from Last 1 Encounters:  04/16/20 5' 1.5" (1.562 m)    Physical exam:  General: The patient is awake, alert and appears highly anxious. The patient is well groomed. Head: Normocephalic, atraumatic. Neck is supple. Mallampati2, neck circumference:14 Cardiovascular:  Regular rate and rhythm , without  murmurs or carotid bruit, and without distended neck veins. She is in constant motion . Tipping her foot and gripping the armchair armrest.   Respiratory: Lungs are clear to auscultation. Skin:  Without evidence of edema, or rash Trunk: BMI is 32 . Neurologic exam : The patient is awake and alert, oriented to place and time.  There is an ab- normal attention span & concentration ability.  Patient confuses left and right, cannot put a timeframe to her medical history and procedures. Speech is semi=fluent with dysphonia or aphasia. Mood and affect are anxious, depressed and functionally altered.   Cranial nerves: Pupils are equal and briskly reactive to light. Funduscopic exam deferred.  No evidence of pallor or edema. Extraocular movements  in vertical and horizontal planes intact and without nystagmus.  No problem with accomodation.  Visual fields by finger perimetry are intact. Hearing  to finger rub non -existent.    Facial sensation intact to fine touch. Facial motor strength is symmetric ( " one eye has always looked bigger than the other, right is droopier " )  and tongue and uvula move midline. Tongue protrusion into either cheek is normal. Shoulder shrug is normal.   Motor exam:  Patient provided weakness in left hip flexion, and atrophy over left thenar eminence.   Sensory:  Fine touchand vibration were tested in all extremities and normal - intact. . Proprioception was normal.  Coordination: Rapid alternating movements in the fingers/hands were poorly coordinated-  Finger-to-nose maneuver with left sided  dysmetria , mild intention tremor.  Gait and station: Patient walks without assistive device and is stable enough to  unassisted stride down the hallway.   She drifts slighty towards the right and turns with 4 steps 180 degrees, she is able to left both feet in a storck gait maneuver.   Strength ?  Not sure what is functional and what's organic. Stance is stable and normal. Tandem gait is unsteady with abasia / not  astasia.  Romberg testing is negative.  Deep tendon reflexes: in the upper and lower extremities are symmetric and intact. Babinski maneuver response is downgoing.   Assessment:  After physical and neurologic examination, review of laboratory studies, imaging, neurophysiology testing and pre-existing records, assessment is that of :   Non organic gait disorder, intact sensory and no evidence of cerebellar ataxia, eye movements or speech changes.   Akathesia.   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  Continue psychiatric care, PT and fall prevention with ortho and PCP.   Non CNS cause.   Asencion Partridge Andric Kerce MD 04/16/2020

## 2020-04-16 NOTE — Patient Instructions (Signed)

## 2020-04-25 ENCOUNTER — Other Ambulatory Visit (HOSPITAL_COMMUNITY): Payer: Self-pay | Admitting: Orthopedic Surgery

## 2020-04-25 ENCOUNTER — Other Ambulatory Visit: Payer: Self-pay | Admitting: Orthopedic Surgery

## 2020-04-25 DIAGNOSIS — T8484XA Pain due to internal orthopedic prosthetic devices, implants and grafts, initial encounter: Secondary | ICD-10-CM

## 2020-05-03 ENCOUNTER — Encounter (HOSPITAL_COMMUNITY): Payer: 59

## 2020-05-14 ENCOUNTER — Telehealth: Payer: Self-pay | Admitting: Orthopedic Surgery

## 2020-05-16 ENCOUNTER — Encounter (HOSPITAL_COMMUNITY): Payer: 59

## 2020-05-16 ENCOUNTER — Encounter (HOSPITAL_COMMUNITY): Payer: Self-pay

## 2020-05-16 ENCOUNTER — Encounter (HOSPITAL_COMMUNITY)
Admission: RE | Admit: 2020-05-16 | Discharge: 2020-05-16 | Disposition: A | Discharge status: Home / Self Care | Payer: 59 | Source: Ambulatory Visit | Attending: Orthopedic Surgery | Admitting: Orthopedic Surgery

## 2020-05-16 ENCOUNTER — Other Ambulatory Visit: Payer: Self-pay

## 2020-05-16 DIAGNOSIS — T8484XA Pain due to internal orthopedic prosthetic devices, implants and grafts, initial encounter: Secondary | ICD-10-CM | POA: Diagnosis not present

## 2020-05-16 MED ORDER — TECHNETIUM TC 99M MEDRONATE IV KIT: 20.0000 | PACK | Freq: Once | INTRAVENOUS | Status: DC | PRN

## 2020-06-19 ENCOUNTER — Ambulatory Visit: Payer: Self-pay | Admitting: Student

## 2020-06-19 NOTE — H&P (View-Only) (Signed)
TOTAL KNEE REVISION ADMISSION H&P  Patient is being admitted for left revision total knee arthroplasty.  Subjective:  Chief Complaint:left knee pain.  HPI: Traci Bird, 59 y.o. female, has a history of pain and functional disability in the left knee(s) due to failed previous arthroplasty and patient has failed non-surgical conservative treatments for greater than 12 weeks to include NSAID's and/or analgesics and activity modification. The indications for the revision of the total knee arthroplasty are loosening of one or more components. Onset of symptoms was gradual starting 2 years ago with gradually worsening course since that time.  Prior procedures on the left knee(s) include total knee arthroplasty.  Patient currently rates pain in the left knee(s) at 8 out of 10 with activity. There is worsening of pain with activity and weight bearing, pain that interferes with activities of daily living and pain with passive range of motion.  Patient has evidence of prosthetic loosening by imaging studies. This condition presents safety issues increasing the risk of falls. There is no current active infection.  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   Past Medical History:  Diagnosis Date   Acute meniscal tear of knee LEFT   Anxiety    Arthritis HIPS , KNEES, HANDS   Chondromalacia of left knee    Depression    History of diabetes mellitus NO ISSUE SINCE GASTRIC BYPASS   History of sleep apnea NO ISSUES SINCE GASTRIC BYPASS   Hypothyroidism     Past Surgical History:  Procedure Laterality Date   ABDOMINAL HYSTERECTOMY   11-06-2005  DR HENLEY   W/ BILATERAL SALPINGO-OOPHORECTOMY   bilateral breast reduction  1992   GASTRIC BYPASS  2008   HYSTEROSCOPY WITH D & C  05-16-2005  DR HENLEY   POLYPECTOMY   JOINT REPLACEMENT Left March 22, 2015   Knee   KNEE ARTHROSCOPY  10/29/2012   Procedure: ARTHROSCOPY KNEE;  Surgeon: Johnn Hai, MD;  Location: [place];  Service: Orthopedics;  Laterality: Left;  WITH DEBRIDEMENT   KNEE ARTHROSCOPY WITH LATERAL RELEASE  10/29/2012   Procedure: KNEE ARTHROSCOPY WITH LATERAL RELEASE;  Surgeon: Johnn Hai, MD;  Location: [place];  Service: Orthopedics;  Laterality: Left;   NEUROPLASTY / TRANSPOSITION MEDIAN NERVE AT CARPAL TUNNEL BILATERAL  2002   partial thyoidectomy  1994   PERIPHERAL VASCULAR CATHETERIZATION N/A 10/12/2015   Procedure: Upper Extremity Angiography;  Surgeon: Elam Dutch, MD;  Location: [facility];  Service: Cardiovascular;  Laterality: N/A;   SEPTOPLASTY  1987   tendon repair heel  2011   left   TONSILLECTOMY AND ADENOIDECTOMY  1966   TOTAL KNEE ARTHROPLASTY Left 03/22/2015   Procedure: LEFT TOTAL KNEE ARTHROPLASTY;  Surgeon: Susa Day, MD;  Location: [facility];  Service: Orthopedics;  Laterality: Left;   TUBAL LIGATION  1984    Current Outpatient Medications  Medication Sig Dispense Refill Last Dose   acetaminophen (TYLENOL) 500 MG tablet Take 500 mg by mouth every 6 (six) hours as needed for mild pain. (Patient not taking: Reported on 06/12/2020)      busPIRone (BUSPAR) 15 MG tablet Take 15  mg by mouth daily as needed (anxiety).       clonazePAM (KLONOPIN) 0.5 MG tablet Take 0.5 mg by mouth 2 (two) times daily as needed for anxiety.       gabapentin (NEURONTIN) 600 MG tablet Take 600 mg by mouth at bedtime as needed. (Patient not taking: Reported on 06/12/2020)      levothyroxine (SYNTHROID, LEVOTHROID) 75 MCG tablet Take 75 mcg by mouth at bedtime.       oxyCODONE-acetaminophen  (PERCOCET) 10-325 MG tablet Take 1 tablet by mouth every 6 (six) hours as needed for pain.       prochlorperazine (COMPAZINE) 10 MG tablet Take 10 mg by mouth every 6 (six) hours as needed for nausea or vomiting.      promethazine (PHENERGAN) 25 MG tablet Take 25 mg by mouth every 8 (eight) hours as needed for nausea/vomiting.      sertraline (ZOLOFT) 100 MG tablet Take 200 mg by mouth daily.      tiZANidine (ZANAFLEX) 4 MG tablet Take 4 mg by mouth at bedtime as needed for muscle spasms.       trazodone (DESYREL) 300 MG tablet Take 300 mg by mouth at bedtime.      Vitamin D, Ergocalciferol, (DRISDOL) 1.25 MG (50000 UNIT) CAPS capsule Take 50,000 Units by mouth once a week.      zolpidem (AMBIEN CR) 12.5 MG CR tablet Take 6.25 mg by mouth at bedtime as needed for sleep.      No current facility-administered medications for this visit.   Allergies  Allergen Reactions   Morphine And Related Itching    Pump    Nsaids Other (See Comments)    Gastric bypass surgery    Social History   Tobacco Use   Smoking status: Never Smoker   Smokeless tobacco: Never Used  Substance Use Topics   Alcohol use: No    Family History  Problem Relation Age of Onset   Diabetes Mother    Hyperlipidemia Mother    Hypertension Mother    Diabetes Father    Hyperlipidemia Father    Heart disease Father        CAD- Open Heart   Colon cancer Neg Hx    Stomach cancer Neg Hx       Review of Systems  Constitutional: Negative.   HENT: Positive for hearing loss.   Eyes: Negative.   Respiratory: Negative.   Cardiovascular: Negative.   Gastrointestinal: Negative.   Endocrine: Negative.   Genitourinary: Negative.   Musculoskeletal: Positive for arthralgias, back pain and myalgias.  Allergic/Immunologic: Negative.   Neurological:       Balance issues  Hematological: Negative.   Psychiatric/Behavioral: Positive for dysphoric mood.     Objective:  Physical Exam Constitutional:       Appearance: Normal appearance.  HENT:     Head: Normocephalic and atraumatic.  Eyes:     Extraocular Movements: Extraocular movements intact.     Pupils: Pupils are equal, round, and reactive to light.  Cardiovascular:     Rate and Rhythm: Normal rate and regular rhythm.     Heart sounds: No murmur heard.  No friction rub. No gallop.   Pulmonary:     Effort: Pulmonary effort is normal.     Breath sounds: Normal breath sounds. No wheezing, rhonchi or rales.  Abdominal:     Palpations: Abdomen is soft.     Tenderness: There is no abdominal tenderness.  Genitourinary:    Comments: Deferred Musculoskeletal:  Cervical back: Normal range of motion and neck supple.     Comments: Examination of the left knee reveals a healed anterior incision. No warmth, erythema, or effusion. She has patellofemoral crepitation with range of motion. She has some tenderness to palpation over the tibial tray. No ligamentous instability. No extensor lag. Painless range of motion of the hip.  Skin:    General: Skin is warm and dry.  Neurological:     General: No focal deficit present.     Mental Status: She is alert and oriented to person, place, and time.  Psychiatric:        Behavior: Behavior normal.        Thought Content: Thought content normal.     Vital signs in last 24 hours: @VSRANGES @  Labs:  Estimated body mass index is 32.16 kg/m as calculated from the following:   Height as of 04/16/20: 5' 1.5" (1.562 m).   Weight as of 04/16/20: 78.5 kg.  Imaging Review The overall alignment is neutral.There is evidence of loosening of the tibial components. The bone quality appears to be adequate for age and reported activity level.     Assessment/Plan:  End stage arthritis, left knee(s) with failed previous arthroplasty.   The patient history, physical examination, clinical judgment of the provider and imaging studies are consistent with end stage degenerative joint disease of the left  knee(s), previous total knee arthroplasty. Revision total knee arthroplasty is deemed medically necessary. The treatment options including medical management, injection therapy, arthroscopy and revision arthroplasty were discussed at length. The risks and benefits of revision total knee arthroplasty were presented and reviewed. The risks due to aseptic loosening, infection, stiffness, patella tracking problems, thromboembolic complications and other imponderables were discussed. The patient acknowledged the explanation, agreed to proceed with the plan and consent was signed. Patient is being admitted for inpatient treatment for surgery, pain control, PT, OT, prophylactic antibiotics, VTE prophylaxis, progressive ambulation and ADL's and discharge planning.The patient is planning to be discharged home with outpatient physical therapy.

## 2020-06-19 NOTE — H&P (Signed)
TOTAL KNEE REVISION ADMISSION H&P  Patient is being admitted for left revision total knee arthroplasty.  Subjective:  Chief Complaint:left knee pain.  HPI: Traci Bird, 59 y.o. female, has a history of pain and functional disability in the left knee(s) due to failed previous arthroplasty and patient has failed non-surgical conservative treatments for greater than 12 weeks to include NSAID's and/or analgesics and activity modification. The indications for the revision of the total knee arthroplasty are loosening of one or more components. Onset of symptoms was gradual starting 2 years ago with gradually worsening course since that time.  Prior procedures on the left knee(s) include total knee arthroplasty.  Patient currently rates pain in the left knee(s) at 8 out of 10 with activity. There is worsening of pain with activity and weight bearing, pain that interferes with activities of daily living and pain with passive range of motion.  Patient has evidence of prosthetic loosening by imaging studies. This condition presents safety issues increasing the risk of falls. There is no current active infection.  Patient Active Problem List   Diagnosis Date Noted  . Status post arthroscopy of right knee 04/16/2020  . Status post left knee replacement 04/16/2020  . Functional gait disorder with tremor 04/16/2020  . History of major depression 04/16/2020  . Depression with somatization 04/16/2020  . Mass of right finger 10/03/2015  . AVM (arteriovenous malformation) 10/03/2015  . 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 Acute meniscal tear of knee LEFT  . Anxiety   . Arthritis HIPS , KNEES, HANDS  . Chondromalacia of left knee   . Depression   . History of diabetes mellitus NO ISSUE SINCE GASTRIC BYPASS  . History of sleep apnea NO ISSUES SINCE GASTRIC BYPASS  . Hypothyroidism     Past Surgical History:  Procedure Laterality Date  . ABDOMINAL HYSTERECTOMY   11-06-2005  DR HENLEY   W/ BILATERAL SALPINGO-OOPHORECTOMY  . bilateral breast reduction  1992  . GASTRIC BYPASS  2008  . HYSTEROSCOPY WITH D & C  05-16-2005  DR HENLEY   POLYPECTOMY  . JOINT REPLACEMENT Left March 22, 2015   Knee  . KNEE ARTHROSCOPY  10/29/2012   Procedure: ARTHROSCOPY KNEE;  Surgeon: Johnn Hai, MD;  Location: [facility];  Service: Orthopedics;  Laterality: Left;  WITH DEBRIDEMENT  . KNEE ARTHROSCOPY WITH LATERAL RELEASE  10/29/2012   Procedure: KNEE ARTHROSCOPY WITH LATERAL RELEASE;  Surgeon: Johnn Hai, MD;  Location: [facility];  Service: Orthopedics;  Laterality: Left;  . NEUROPLASTY / TRANSPOSITION MEDIAN NERVE AT CARPAL TUNNEL BILATERAL  2002  . partial thyoidectomy  1994  . PERIPHERAL VASCULAR CATHETERIZATION N/A 10/12/2015   Procedure: Upper Extremity Angiography;  Surgeon: Elam Dutch, MD;  Location: [facility];  Service: Cardiovascular;  Laterality: N/A;  . SEPTOPLASTY  1987  . tendon repair heel  2011   left  . TONSILLECTOMY AND ADENOIDECTOMY  1966  . TOTAL KNEE ARTHROPLASTY Left 03/22/2015   Procedure: LEFT TOTAL KNEE ARTHROPLASTY;  Surgeon: Susa Day, MD;  Location: [facility];  Service: Orthopedics;  Laterality: Left;  . TUBAL LIGATION  1984    Current Outpatient Medications  Medication Sig Dispense Refill Last Dose  . acetaminophen (TYLENOL) 500 MG tablet Take 500 mg by mouth every 6 (six) hours as needed for mild pain. (Patient not taking: Reported on 06/12/2020)     . busPIRone (BUSPAR) 15 MG tablet Take 15  mg by mouth daily as needed (anxiety).      . clonazePAM (KLONOPIN) 0.5 MG tablet Take 0.5 mg by mouth 2 (two) times daily as needed for anxiety.      . gabapentin (NEURONTIN) 600 MG tablet Take 600 mg by mouth at bedtime as needed. (Patient not taking: Reported on 06/12/2020)     . levothyroxine (SYNTHROID, LEVOTHROID) 75 MCG tablet Take 75 mcg by mouth at bedtime.      Marland Kitchen oxyCODONE-acetaminophen  (PERCOCET) 10-325 MG tablet Take 1 tablet by mouth every 6 (six) hours as needed for pain.      Marland Kitchen prochlorperazine (COMPAZINE) 10 MG tablet Take 10 mg by mouth every 6 (six) hours as needed for nausea or vomiting.     . promethazine (PHENERGAN) 25 MG tablet Take 25 mg by mouth every 8 (eight) hours as needed for nausea/vomiting.     . sertraline (ZOLOFT) 100 MG tablet Take 200 mg by mouth daily.     Marland Kitchen tiZANidine (ZANAFLEX) 4 MG tablet Take 4 mg by mouth at bedtime as needed for muscle spasms.      . trazodone (DESYREL) 300 MG tablet Take 300 mg by mouth at bedtime.     . Vitamin D, Ergocalciferol, (DRISDOL) 1.25 MG (50000 UNIT) CAPS capsule Take 50,000 Units by mouth once a week.     . zolpidem (AMBIEN CR) 12.5 MG CR tablet Take 6.25 mg by mouth at bedtime as needed for sleep.      No current facility-administered medications for this visit.   Allergies  Allergen Reactions  . Morphine And Related Itching    Pump   . Nsaids Other (See Comments)    Gastric bypass surgery    Social History   Tobacco Use  . Smoking status: Never Smoker  . Smokeless tobacco: Never Used  Substance Use Topics  . Alcohol use: No    Family History  Problem Relation Age of Onset  . Diabetes Mother   . Hyperlipidemia Mother   . Hypertension Mother   . Diabetes Father   . Hyperlipidemia Father   . Heart disease Father        CAD- Open Heart  . Colon cancer Neg Hx   . Stomach cancer Neg Hx       Review of Systems  Constitutional: Negative.   HENT: Positive for hearing loss.   Eyes: Negative.   Respiratory: Negative.   Cardiovascular: Negative.   Gastrointestinal: Negative.   Endocrine: Negative.   Genitourinary: Negative.   Musculoskeletal: Positive for arthralgias, back pain and myalgias.  Allergic/Immunologic: Negative.   Neurological:       Balance issues  Hematological: Negative.   Psychiatric/Behavioral: Positive for dysphoric mood.     Objective:  Physical Exam Constitutional:       Appearance: Normal appearance.  HENT:     Head: Normocephalic and atraumatic.  Eyes:     Extraocular Movements: Extraocular movements intact.     Pupils: Pupils are equal, round, and reactive to light.  Cardiovascular:     Rate and Rhythm: Normal rate and regular rhythm.     Heart sounds: No murmur heard.  No friction rub. No gallop.   Pulmonary:     Effort: Pulmonary effort is normal.     Breath sounds: Normal breath sounds. No wheezing, rhonchi or rales.  Abdominal:     Palpations: Abdomen is soft.     Tenderness: There is no abdominal tenderness.  Genitourinary:    Comments: Deferred Musculoskeletal:  Cervical back: Normal range of motion and neck supple.     Comments: Examination of the left knee reveals a healed anterior incision. No warmth, erythema, or effusion. She has patellofemoral crepitation with range of motion. She has some tenderness to palpation over the tibial tray. No ligamentous instability. No extensor lag. Painless range of motion of the hip.  Skin:    General: Skin is warm and dry.  Neurological:     General: No focal deficit present.     Mental Status: She is alert and oriented to person, place, and time.  Psychiatric:        Behavior: Behavior normal.        Thought Content: Thought content normal.     Vital signs in last 24 hours: @VSRANGES @  Labs:  Estimated body mass index is 32.16 kg/m as calculated from the following:   Height as of 04/16/20: 5' 1.5" (1.562 m).   Weight as of 04/16/20: 78.5 kg.  Imaging Review The overall alignment is neutral.There is evidence of loosening of the tibial components. The bone quality appears to be adequate for age and reported activity level.     Assessment/Plan:  End stage arthritis, left knee(s) with failed previous arthroplasty.   The patient history, physical examination, clinical judgment of the provider and imaging studies are consistent with end stage degenerative joint disease of the left  knee(s), previous total knee arthroplasty. Revision total knee arthroplasty is deemed medically necessary. The treatment options including medical management, injection therapy, arthroscopy and revision arthroplasty were discussed at length. The risks and benefits of revision total knee arthroplasty were presented and reviewed. The risks due to aseptic loosening, infection, stiffness, patella tracking problems, thromboembolic complications and other imponderables were discussed. The patient acknowledged the explanation, agreed to proceed with the plan and consent was signed. Patient is being admitted for inpatient treatment for surgery, pain control, PT, OT, prophylactic antibiotics, VTE prophylaxis, progressive ambulation and ADL's and discharge planning.The patient is planning to be discharged home with outpatient physical therapy.

## 2020-06-22 NOTE — Progress Notes (Addendum)
PCP - Bradly Bienenstock, FNP Clearance 05-31-20 care everywhere and on chart  Cardiologist - no  PPM/ICD -  Device Orders -  Rep Notified -   Chest x-ray -  EKG - 05-31-20  on chart Stress Test -  ECHO -  Cardiac Cath -  hgb a1c 05-31-20 care everywhere 5.4  Sleep Study -  CPAP -   Fasting Blood Sugar -  Checks Blood Sugar _____ times a day  Blood Thinner Instructions: Aspirin Instructions:  ERAS Protcol - PRE-SURGERY G2-   COVID TEST- 7-26  ACTIVITY-Walks to mailbox and does housework without SOB Anesthesia review: Dm no issues since gastris bypass   Patient denies shortness of breath, fever, cough and chest pain at PAT appointment  none   All instructions explained to the patient, with a verbal understanding of the material. Patient agrees to go over the instructions while at home for a better understanding. Patient also instructed to self quarantine after being tested for COVID-19. The opportunity to ask questions was provided.

## 2020-06-22 NOTE — Patient Instructions (Signed)
DUE TO COVID-19 ONLY ONE VISITOR IS ALLOWED TO COME WITH YOU AND STAY IN THE WAITING ROOM ONLY DURING PRE OP AND PROCEDURE DAY OF SURGERY. TWO VISITOR MAY VISIT WITH YOU AFTER SURGERY IN YOUR PRIVATE ROOM DURING VISITING HOURS ONLY!  10-a-8p  YOU NEED TO HAVE A COVID 19 TEST ON   7-26-21______ @_______ , THIS TEST MUST BE DONE BEFORE SURGERY, COME  [place], [place] , [id number].  ([place]) ONCE YOUR COVID TEST IS COMPLETED, PLEASE BEGIN THE QUARANTINE INSTRUCTIONS AS OUTLINED IN YOUR HANDOUT.                KIKI BIVENS  06/22/2020   Your procedure is scheduled on:     07-05-20   Report to [facility] Main  Entrance   Report to short stay  at     0530   AM     Call this number if you have problems the morning of surgery [phone]    Remember: NO SOLID FOOD AFTER MIDNIGHT THE NIGHT PRIOR TO SURGERY. NOTHING BY MOUTH EXCEPT CLEAR LIQUIDS UNTIL   0430 am  . PLEASE FINISH ENSURE DRINK PER SURGEON ORDER  WHICH NEEDS TO BE COMPLETED AT      0430 am then nothing by mouth .Marland Kitchen    CLEAR LIQUID DIET   Foods Allowed                                                                                 Foods Excluded  Coffee and tea, regular and decaf No creamer                            liquids that you cannot  Plain Jell-O any favor except red or purple                                           see through such as: Fruit ices (not with fruit pulp)                                                 milk, soups, orange juice  Iced Popsicles                                                       All solid food Carbonated beverages, regular and diet                                    Cranberry, grape and apple juices Sports drinks like Gatorade Lightly seasoned clear broth or consume(fat free) Sugar, honey syrup  _____________________________________________________________________    BRUSH YOUR TEETH MORNING OF SURGERY AND RINSE  YOUR MOUTH OUT, NO CHEWING GUM  CANDY OR MINTS.     Take these medicines the morning of surgery with A SIP OF WATER: zoloft, oxycodone if needed, buspar or klonopin if needed                                You may not have any metal on your body including hair pins and              piercings  Do not wear jewelry, make-up, lotions, powders or perfumes, deodorant             Do not wear nail polish on your fingernails.  Do not shave  48 hours prior to surgery.          Do not bring valuables to the hospital. [place].  Contacts, dentures or bridgework may not be worn into surgery.      Patients discharged the day of surgery will not be allowed to drive home. IF YOU ARE HAVING SURGERY AND GOING HOME THE SAME DAY, YOU MUST HAVE AN ADULT TO DRIVE YOU HOME AND BE WITH YOU FOR 24 HOURS. YOU MAY GO HOME BY TAXI OR UBER OR ORTHERWISE, BUT AN ADULT MUST ACCOMPANY YOU HOME AND STAY WITH YOU FOR 24 HOURS.  Name and phone number of your driver:  Special Instructions: N/A              Please read over the following fact sheets you were given: _____________________________________________________________________             [facility] - Preparing for Surgery Before surgery, you can play an important role.  Because skin is not sterile, your skin needs to be as free of germs as possible.  You can reduce the number of germs on your skin by washing with CHG (chlorahexidine gluconate) soap before surgery.  CHG is an antiseptic cleaner which kills germs and bonds with the skin to continue killing germs even after washing. Please DO NOT use if you have an allergy to CHG or antibacterial soaps.  If your skin becomes reddened/irritated stop using the CHG and inform your nurse when you arrive at Short Stay. Do not shave (including legs and underarms) for at least 48 hours prior to the first CHG shower.  You may shave your face/neck. Please follow these instructions carefully:  1.  Shower  with CHG Soap the night before surgery and the  morning of Surgery.  2.  If you choose to wash your hair, wash your hair first as usual with your  normal  shampoo.  3.  After you shampoo, rinse your hair and body thoroughly to remove the  shampoo.                           4.  Use CHG as you would any other liquid soap.  You can apply chg directly  to the skin and wash                       Gently with a scrungie or clean washcloth.  5.  Apply the CHG Soap to your body ONLY FROM THE NECK DOWN.   Do not use on face/ open  Wound or open sores. Avoid contact with eyes, ears mouth and genitals (private parts).                       Wash face,  Genitals (private parts) with your normal soap.             6.  Wash thoroughly, paying special attention to the area where your surgery  will be performed.  7.  Thoroughly rinse your body with warm water from the neck down.  8.  DO NOT shower/wash with your normal soap after using and rinsing off  the CHG Soap.                9.  Pat yourself dry with a clean towel.            10.  Wear clean pajamas.            11.  Place clean sheets on your bed the night of your first shower and do not  sleep with pets. Day of Surgery : Do not apply any lotions/deodorants the morning of surgery.  Please wear clean clothes to the hospital/surgery center.  FAILURE TO FOLLOW THESE INSTRUCTIONS MAY RESULT IN THE CANCELLATION OF YOUR SURGERY PATIENT SIGNATURE_________________________________  NURSE SIGNATURE__________________________________  ________________________________________________________________________   Adam Phenix  An incentive spirometer is a tool that can help keep your lungs clear and active. This tool measures how well you are filling your lungs with each breath. Taking long deep breaths may help reverse or decrease the chance of developing breathing (pulmonary) problems (especially infection) following:  A long period  of time when you are unable to move or be active. BEFORE THE PROCEDURE   If the spirometer includes an indicator to show your best effort, your nurse or respiratory therapist will set it to a desired goal.  If possible, sit up straight or lean slightly forward. Try not to slouch.  Hold the incentive spirometer in an upright position. INSTRUCTIONS FOR USE  1. Sit on the edge of your bed if possible, or sit up as far as you can in bed or on a chair. 2. Hold the incentive spirometer in an upright position. 3. Breathe out normally. 4. Place the mouthpiece in your mouth and seal your lips tightly around it. 5. Breathe in slowly and as deeply as possible, raising the piston or the ball toward the top of the column. 6. Hold your breath for 3-5 seconds or for as long as possible. Allow the piston or ball to fall to the bottom of the column. 7. Remove the mouthpiece from your mouth and breathe out normally. 8. Rest for a few seconds and repeat Steps 1 through 7 at least 10 times every 1-2 hours when you are awake. Take your time and take a few normal breaths between deep breaths. 9. The spirometer may include an indicator to show your best effort. Use the indicator as a goal to work toward during each repetition. 10. After each set of 10 deep breaths, practice coughing to be sure your lungs are clear. If you have an incision (the cut made at the time of surgery), support your incision when coughing by placing a pillow or rolled up towels firmly against it. Once you are able to get out of bed, walk around indoors and cough well. You may stop using the incentive spirometer when instructed by your caregiver.  RISKS AND COMPLICATIONS  Take your time so you do not get  dizzy or light-headed.  If you are in pain, you may need to take or ask for pain medication before doing incentive spirometry. It is harder to take a deep breath if you are having pain. AFTER USE  Rest and breathe slowly and easily.  It  can be helpful to keep track of a log of your progress. Your caregiver can provide you with a simple table to help with this. If you are using the spirometer at home, follow these instructions: [place] IF:   You are having difficultly using the spirometer.  You have trouble using the spirometer as often as instructed.  Your pain medication is not giving enough relief while using the spirometer.  You develop fever of 100.5 F (38.1 C) or higher. SEEK IMMEDIATE MEDICAL CARE IF:   You cough up bloody sputum that had not been present before.  You develop fever of 102 F (38.9 C) or greater.  You develop worsening pain at or near the incision site. MAKE SURE YOU:   Understand these instructions.  Will watch your condition.  Will get help right away if you are not doing well or get worse. Document Released: 04/06/2007 Document Revised: 02/16/2012 Document Reviewed: 06/07/2007 [facility] Patient Information 2014 [place], [place].   ________________________________________________________________________

## 2020-06-26 ENCOUNTER — Other Ambulatory Visit: Payer: Self-pay

## 2020-06-26 ENCOUNTER — Encounter (HOSPITAL_COMMUNITY)
Admission: RE | Admit: 2020-06-26 | Discharge: 2020-06-26 | Disposition: A | Discharge status: Home / Self Care | Payer: 59 | Source: Ambulatory Visit | Attending: Orthopedic Surgery | Admitting: Orthopedic Surgery

## 2020-06-26 ENCOUNTER — Encounter (HOSPITAL_COMMUNITY): Payer: Self-pay

## 2020-06-26 DIAGNOSIS — Z01812 Encounter for preprocedural laboratory examination: Secondary | ICD-10-CM | POA: Insufficient documentation

## 2020-06-26 HISTORY — DX: Gastro-esophageal reflux disease without esophagitis: K21.9

## 2020-06-26 HISTORY — DX: Other specified postprocedural states: Z98.890

## 2020-06-26 HISTORY — DX: Sleep apnea, unspecified: G47.30

## 2020-06-26 HISTORY — DX: Myoneural disorder, unspecified: G70.9

## 2020-06-26 HISTORY — DX: Family history of other specified conditions: Z84.89

## 2020-06-26 HISTORY — DX: Type 2 diabetes mellitus without complications: E11.9

## 2020-06-26 HISTORY — DX: Nausea with vomiting, unspecified: R11.2

## 2020-06-26 LAB — URINALYSIS, ROUTINE W REFLEX MICROSCOPIC
Bacteria, UA: NONE SEEN
Glucose, UA: NEGATIVE mg/dL
Hgb urine dipstick: NEGATIVE
Ketones, ur: NEGATIVE mg/dL
Nitrite: NEGATIVE
Protein, ur: NEGATIVE mg/dL
Specific Gravity, Urine: 1.027 (ref 1.005–1.030)
pH: 5 (ref 5.0–8.0)

## 2020-06-26 LAB — PROTIME-INR
INR: 1 (ref 0.8–1.2)
Prothrombin Time: 12.8 seconds (ref 11.4–15.2)

## 2020-06-26 LAB — COMPREHENSIVE METABOLIC PANEL
ALT: 15 U/L (ref 0–44)
AST: 18 U/L (ref 15–41)
Albumin: 4.1 g/dL (ref 3.5–5.0)
Alkaline Phosphatase: 65 U/L (ref 38–126)
Anion gap: 9 (ref 5–15)
BUN: 14 mg/dL (ref 6–20)
CO2: 29 mmol/L (ref 22–32)
Calcium: 9.3 mg/dL (ref 8.9–10.3)
Chloride: 104 mmol/L (ref 98–111)
Creatinine, Ser: 0.72 mg/dL (ref 0.44–1.00)
GFR calc Af Amer: >60 mL/min (ref >60)
GFR calc non Af Amer: >60 mL/min (ref >60)
Glucose, Bld: 86 mg/dL (ref 70–99)
Potassium: 4.1 mmol/L (ref 3.5–5.1)
Sodium: 142 mmol/L (ref 135–145)
Total Bilirubin: 0.4 mg/dL (ref 0.3–1.2)
Total Protein: 6.9 g/dL (ref 6.5–8.1)

## 2020-06-26 LAB — CBC
HCT: 42.3 % (ref 36.0–46.0)
Hemoglobin: 13.1 g/dL (ref 12.0–15.0)
MCH: 27.9 pg (ref 26.0–34.0)
MCHC: 31 g/dL (ref 30.0–36.0)
MCV: 90.2 fL (ref 80.0–100.0)
Platelets: 181 10*3/uL (ref 150–400)
RBC: 4.69 MIL/uL (ref 3.87–5.11)
RDW: 13.1 % (ref 11.5–15.5)
WBC: 4.9 10*3/uL (ref 4.0–10.5)
nRBC: 0 % (ref 0.0–0.2)

## 2020-06-26 LAB — GLUCOSE, CAPILLARY: Glucose-Capillary: 77 mg/dL (ref 70–99)

## 2020-06-27 LAB — SURGICAL PCR SCREEN
MRSA, PCR: POSITIVE — AB
Staphylococcus aureus: POSITIVE — AB

## 2020-07-02 ENCOUNTER — Other Ambulatory Visit (HOSPITAL_COMMUNITY)
Admission: RE | Admit: 2020-07-02 | Discharge: 2020-07-02 | Disposition: A | Discharge status: Home / Self Care | Payer: 59 | Source: Ambulatory Visit | Attending: Orthopedic Surgery | Admitting: Orthopedic Surgery

## 2020-07-02 DIAGNOSIS — Z20822 Contact with and (suspected) exposure to covid-19: Secondary | ICD-10-CM | POA: Diagnosis not present

## 2020-07-02 DIAGNOSIS — Z01812 Encounter for preprocedural laboratory examination: Secondary | ICD-10-CM | POA: Diagnosis not present

## 2020-07-02 LAB — SARS CORONAVIRUS 2 (TAT 6-24 HRS): SARS Coronavirus 2: NEGATIVE

## 2020-07-04 NOTE — Anesthesia Preprocedure Evaluation (Addendum)
Anesthesia Evaluation  Patient identified by MRN, date of birth, ID band Patient awake    Reviewed: Allergy & Precautions, NPO status , Patient's Chart, lab work & pertinent test results  History of Anesthesia Complications (+) PONV and history of anesthetic complications  Airway Mallampati: II  TM Distance: >3 FB Neck ROM: Full    Dental  (+) Chipped,    Pulmonary neg pulmonary ROS,    Pulmonary exam normal breath sounds clear to auscultation       Cardiovascular negative cardio ROS Normal cardiovascular exam Rhythm:Regular Rate:Normal     Neuro/Psych PSYCHIATRIC DISORDERS Anxiety Depression negative neurological ROS     GI/Hepatic negative GI ROS, Neg liver ROS,   Endo/Other  diabetesHypothyroidism   Renal/GU negative Renal ROS     Musculoskeletal negative musculoskeletal ROS (+)   Abdominal (+) + obese,   Peds  Hematology negative hematology ROS (+)   Anesthesia Other Findings failed left total knee arthroplasty  Reproductive/Obstetrics                            Anesthesia Physical Anesthesia Plan  ASA: II  Anesthesia Plan: Regional and Spinal   Post-op Pain Management:  Regional for Post-op pain   Induction: Intravenous  PONV Risk Score and Plan: 3 and Ondansetron, Dexamethasone, Propofol infusion, Treatment may vary due to age or medical condition and Midazolam  Airway Management Planned: Simple Face Mask  Additional Equipment:   Intra-op Plan:   Post-operative Plan:   Informed Consent: I have reviewed the patients History and Physical, chart, labs and discussed the procedure including the risks, benefits and alternatives for the proposed anesthesia with the patient or authorized representative who has indicated his/her understanding and acceptance.     Dental advisory given  Plan Discussed with: CRNA  Anesthesia Plan Comments:       Anesthesia Quick  Evaluation

## 2020-07-05 ENCOUNTER — Ambulatory Visit (HOSPITAL_COMMUNITY): Payer: 59 | Admitting: Certified Registered Nurse Anesthetist

## 2020-07-05 ENCOUNTER — Ambulatory Visit (HOSPITAL_COMMUNITY): Payer: 59

## 2020-07-05 ENCOUNTER — Other Ambulatory Visit: Payer: Self-pay

## 2020-07-05 ENCOUNTER — Observation Stay (HOSPITAL_COMMUNITY)
Admission: RE | Admit: 2020-07-05 | Discharge: 2020-07-07 | Disposition: A | Discharge status: Home / Self Care | Payer: 59 | Attending: Orthopedic Surgery | Admitting: Orthopedic Surgery

## 2020-07-05 ENCOUNTER — Encounter (HOSPITAL_COMMUNITY): Payer: Self-pay | Admitting: Orthopedic Surgery

## 2020-07-05 ENCOUNTER — Encounter (HOSPITAL_COMMUNITY)
Admission: RE | Disposition: A | Discharge status: Home / Self Care | Payer: Self-pay | Source: Home / Self Care | Attending: Orthopedic Surgery

## 2020-07-05 DIAGNOSIS — E039 Hypothyroidism, unspecified: Secondary | ICD-10-CM | POA: Insufficient documentation

## 2020-07-05 DIAGNOSIS — T84093A Other mechanical complication of internal left knee prosthesis, initial encounter: Secondary | ICD-10-CM | POA: Diagnosis not present

## 2020-07-05 DIAGNOSIS — M1712 Unilateral primary osteoarthritis, left knee: Secondary | ICD-10-CM | POA: Diagnosis not present

## 2020-07-05 DIAGNOSIS — E119 Type 2 diabetes mellitus without complications: Secondary | ICD-10-CM | POA: Diagnosis not present

## 2020-07-05 DIAGNOSIS — Z96652 Presence of left artificial knee joint: Secondary | ICD-10-CM

## 2020-07-05 DIAGNOSIS — M25562 Pain in left knee: Secondary | ICD-10-CM | POA: Diagnosis present

## 2020-07-05 HISTORY — PX: TOTAL KNEE REVISION: SHX996

## 2020-07-05 LAB — GLUCOSE, CAPILLARY
Glucose-Capillary: 82 mg/dL (ref 70–99)
Glucose-Capillary: 92 mg/dL (ref 70–99)

## 2020-07-05 LAB — TYPE AND SCREEN
ABO/RH(D): A POS
Antibody Screen: NEGATIVE

## 2020-07-05 SURGERY — TOTAL KNEE REVISION
Anesthesia: Regional | Site: Knee | Laterality: Left

## 2020-07-05 MED ORDER — OXYCODONE HCL 5 MG PO TABS
5.0000 mg | ORAL_TABLET | ORAL | Status: DC | PRN
  Administered 2020-07-05 – 2020-07-06 (×4): 10 mg via ORAL
  Administered 2020-07-06: 5 mg via ORAL
  Filled 2020-07-05 (×2): qty 2
  Filled 2020-07-05: qty 1
  Filled 2020-07-05: qty 2

## 2020-07-05 MED ORDER — TRANEXAMIC ACID-NACL 1000-0.7 MG/100ML-% IV SOLN
1000.0000 mg | INTRAVENOUS | Status: AC
  Administered 2020-07-05: 1000 mg via INTRAVENOUS
  Filled 2020-07-05: qty 100

## 2020-07-05 MED ORDER — TRAZODONE HCL 100 MG PO TABS
300.0000 mg | ORAL_TABLET | Freq: Every day | ORAL | Status: DC
  Administered 2020-07-05 – 2020-07-06 (×2): 300 mg via ORAL
  Filled 2020-07-05 (×2): qty 3

## 2020-07-05 MED ORDER — KETOROLAC TROMETHAMINE 30 MG/ML IJ SOLN
INTRAMUSCULAR | Status: AC
  Filled 2020-07-05: qty 1

## 2020-07-05 MED ORDER — MENTHOL 3 MG MT LOZG: 1.0000 | LOZENGE | OROMUCOSAL | Status: DC | PRN

## 2020-07-05 MED ORDER — POVIDONE-IODINE 10 % EX SWAB
2.0000 "application " | Freq: Once | CUTANEOUS | Status: AC
  Administered 2020-07-05: 2 via TOPICAL

## 2020-07-05 MED ORDER — METHOCARBAMOL 500 MG IVPB - SIMPLE MED
INTRAVENOUS | Status: AC
  Administered 2020-07-05: 500 mg via INTRAVENOUS
  Filled 2020-07-05: qty 50

## 2020-07-05 MED ORDER — SODIUM CHLORIDE (PF) 0.9 % IJ SOLN
INTRAMUSCULAR | Status: AC
  Filled 2020-07-05: qty 50

## 2020-07-05 MED ORDER — ORAL CARE MOUTH RINSE: 15.0000 mL | Freq: Once | OROMUCOSAL | Status: AC

## 2020-07-05 MED ORDER — HYDROMORPHONE HCL 1 MG/ML IJ SOLN
INTRAMUSCULAR | Status: AC
  Administered 2020-07-05: 0.5 mg via INTRAVENOUS
  Filled 2020-07-05: qty 1

## 2020-07-05 MED ORDER — PHENOL 1.4 % MT LIQD: 1.0000 | OROMUCOSAL | Status: DC | PRN

## 2020-07-05 MED ORDER — BUPIVACAINE-EPINEPHRINE 0.25% -1:200000 IJ SOLN
INTRAMUSCULAR | Status: DC | PRN
  Administered 2020-07-05: 30 mL

## 2020-07-05 MED ORDER — DOCUSATE SODIUM 100 MG PO CAPS
100.0000 mg | ORAL_CAPSULE | Freq: Two times a day (BID) | ORAL | Status: DC
  Administered 2020-07-05 – 2020-07-07 (×4): 100 mg via ORAL
  Filled 2020-07-05 (×4): qty 1

## 2020-07-05 MED ORDER — PROPOFOL 500 MG/50ML IV EMUL
INTRAVENOUS | Status: AC
  Filled 2020-07-05: qty 50

## 2020-07-05 MED ORDER — APIXABAN 2.5 MG PO TABS
2.5000 mg | ORAL_TABLET | Freq: Two times a day (BID) | ORAL | Status: DC
  Administered 2020-07-06 – 2020-07-07 (×3): 2.5 mg via ORAL
  Filled 2020-07-05 (×3): qty 1

## 2020-07-05 MED ORDER — MIDAZOLAM HCL 2 MG/2ML IJ SOLN
INTRAMUSCULAR | Status: DC | PRN
  Administered 2020-07-05: 2 mg via INTRAVENOUS

## 2020-07-05 MED ORDER — 0.9 % SODIUM CHLORIDE (POUR BTL) OPTIME
TOPICAL | Status: DC | PRN
  Administered 2020-07-05: 1000 mL

## 2020-07-05 MED ORDER — ONDANSETRON HCL 4 MG/2ML IJ SOLN
4.0000 mg | Freq: Four times a day (QID) | INTRAMUSCULAR | Status: DC | PRN
  Administered 2020-07-06: 4 mg via INTRAVENOUS
  Filled 2020-07-05: qty 2

## 2020-07-05 MED ORDER — CHLORHEXIDINE GLUCONATE 0.12 % MT SOLN
15.0000 mL | Freq: Once | OROMUCOSAL | Status: AC
  Administered 2020-07-05: 15 mL via OROMUCOSAL

## 2020-07-05 MED ORDER — PHENYLEPHRINE 40 MCG/ML (10ML) SYRINGE FOR IV PUSH (FOR BLOOD PRESSURE SUPPORT)
PREFILLED_SYRINGE | INTRAVENOUS | Status: DC | PRN
  Administered 2020-07-05 (×2): 120 ug via INTRAVENOUS
  Administered 2020-07-05 (×2): 80 ug via INTRAVENOUS

## 2020-07-05 MED ORDER — IRRISEPT - 450ML BOTTLE WITH 0.05% CHG IN STERILE WATER, USP 99.95% OPTIME
TOPICAL | Status: DC | PRN
  Administered 2020-07-05: 450 mL

## 2020-07-05 MED ORDER — PHENYLEPHRINE HCL (PRESSORS) 10 MG/ML IV SOLN
INTRAVENOUS | Status: AC
  Filled 2020-07-05: qty 1

## 2020-07-05 MED ORDER — LACTATED RINGERS IV SOLN: INTRAVENOUS | Status: DC

## 2020-07-05 MED ORDER — BACITRACIN ZINC 500 UNIT/GM EX OINT
TOPICAL_OINTMENT | Freq: Two times a day (BID) | CUTANEOUS | Status: DC
  Filled 2020-07-05: qty 28.35

## 2020-07-05 MED ORDER — ACETAMINOPHEN 500 MG PO TABS: 1000.0000 mg | ORAL_TABLET | Freq: Once | ORAL | Status: DC

## 2020-07-05 MED ORDER — ACETAMINOPHEN 325 MG PO TABS
325.0000 mg | ORAL_TABLET | Freq: Four times a day (QID) | ORAL | Status: DC | PRN
  Administered 2020-07-05 – 2020-07-06 (×2): 650 mg via ORAL
  Filled 2020-07-05 (×2): qty 2

## 2020-07-05 MED ORDER — FENTANYL CITRATE (PF) 100 MCG/2ML IJ SOLN
INTRAMUSCULAR | Status: DC | PRN
  Administered 2020-07-05 (×2): 50 ug via INTRAVENOUS

## 2020-07-05 MED ORDER — MUPIROCIN 2 % EX OINT
TOPICAL_OINTMENT | CUTANEOUS | Status: AC
  Filled 2020-07-05: qty 22

## 2020-07-05 MED ORDER — DEXAMETHASONE SODIUM PHOSPHATE 10 MG/ML IJ SOLN
10.0000 mg | Freq: Once | INTRAMUSCULAR | Status: AC
  Administered 2020-07-06: 10 mg via INTRAVENOUS
  Filled 2020-07-05: qty 1

## 2020-07-05 MED ORDER — LEVOTHYROXINE SODIUM 75 MCG PO TABS
75.0000 ug | ORAL_TABLET | Freq: Every day | ORAL | Status: DC
  Administered 2020-07-05 – 2020-07-06 (×2): 75 ug via ORAL
  Filled 2020-07-05 (×2): qty 1

## 2020-07-05 MED ORDER — SODIUM CHLORIDE 0.9 % IV SOLN: INTRAVENOUS | Status: DC

## 2020-07-05 MED ORDER — DIPHENHYDRAMINE HCL 12.5 MG/5ML PO ELIX: 12.5000 mg | ORAL_SOLUTION | ORAL | Status: DC | PRN

## 2020-07-05 MED ORDER — BUSPIRONE HCL 5 MG PO TABS: 15.0000 mg | ORAL_TABLET | Freq: Every day | ORAL | Status: DC | PRN

## 2020-07-05 MED ORDER — ROPIVACAINE HCL 5 MG/ML IJ SOLN
INTRAMUSCULAR | Status: DC | PRN
Start: 2020-07-05 — End: 2020-07-05
  Administered 2020-07-05: 30 mL via PERINEURAL

## 2020-07-05 MED ORDER — PROPOFOL 500 MG/50ML IV EMUL
INTRAVENOUS | Status: DC | PRN
  Administered 2020-07-05: 75 ug/kg/min via INTRAVENOUS

## 2020-07-05 MED ORDER — CLONAZEPAM 0.5 MG PO TABS: 0.5000 mg | ORAL_TABLET | Freq: Two times a day (BID) | ORAL | Status: DC | PRN

## 2020-07-05 MED ORDER — BUPIVACAINE HCL (PF) 0.5 % IJ SOLN
INTRAMUSCULAR | Status: DC | PRN
Start: 2020-07-05 — End: 2020-07-05
  Administered 2020-07-05: 3 mL via INTRATHECAL

## 2020-07-05 MED ORDER — ONDANSETRON HCL 4 MG/2ML IJ SOLN
INTRAMUSCULAR | Status: DC | PRN
  Administered 2020-07-05: 4 mg via INTRAVENOUS

## 2020-07-05 MED ORDER — OXYCODONE HCL 5 MG PO TABS
ORAL_TABLET | ORAL | Status: AC
  Administered 2020-07-05: 10 mg via ORAL
  Filled 2020-07-05: qty 2

## 2020-07-05 MED ORDER — MUPIROCIN 2 % EX OINT
1.0000 "application " | TOPICAL_OINTMENT | Freq: Two times a day (BID) | CUTANEOUS | Status: DC
  Administered 2020-07-05 – 2020-07-07 (×4): 1 via NASAL
  Filled 2020-07-05: qty 22

## 2020-07-05 MED ORDER — PHENYLEPHRINE HCL-NACL 10-0.9 MG/250ML-% IV SOLN
INTRAVENOUS | Status: DC | PRN
  Administered 2020-07-05: 30 ug/min via INTRAVENOUS

## 2020-07-05 MED ORDER — SENNA 8.6 MG PO TABS
1.0000 | ORAL_TABLET | Freq: Two times a day (BID) | ORAL | Status: DC
  Administered 2020-07-05 – 2020-07-07 (×4): 8.6 mg via ORAL
  Filled 2020-07-05 (×4): qty 1

## 2020-07-05 MED ORDER — FENTANYL CITRATE (PF) 100 MCG/2ML IJ SOLN
INTRAMUSCULAR | Status: AC
  Filled 2020-07-05: qty 2

## 2020-07-05 MED ORDER — PHENYLEPHRINE 40 MCG/ML (10ML) SYRINGE FOR IV PUSH (FOR BLOOD PRESSURE SUPPORT)
PREFILLED_SYRINGE | INTRAVENOUS | Status: AC
  Filled 2020-07-05: qty 10

## 2020-07-05 MED ORDER — CHLORHEXIDINE GLUCONATE CLOTH 2 % EX PADS: 6.0000 | MEDICATED_PAD | Freq: Every day | CUTANEOUS | Status: DC

## 2020-07-05 MED ORDER — SODIUM CHLORIDE 0.9 % IR SOLN
Status: DC | PRN
  Administered 2020-07-05: 3000 mL

## 2020-07-05 MED ORDER — ISOPROPYL ALCOHOL 70 % SOLN
Status: DC | PRN
  Administered 2020-07-05: 1 via TOPICAL

## 2020-07-05 MED ORDER — CEFAZOLIN SODIUM-DEXTROSE 2-4 GM/100ML-% IV SOLN
2.0000 g | INTRAVENOUS | Status: AC
  Administered 2020-07-05: 2 g via INTRAVENOUS
  Filled 2020-07-05: qty 100

## 2020-07-05 MED ORDER — PROPOFOL 10 MG/ML IV BOLUS
INTRAVENOUS | Status: DC | PRN
  Administered 2020-07-05: 40 mg via INTRAVENOUS
  Administered 2020-07-05: 20 mg via INTRAVENOUS

## 2020-07-05 MED ORDER — MIDAZOLAM HCL 2 MG/2ML IJ SOLN
INTRAMUSCULAR | Status: AC
  Filled 2020-07-05: qty 2

## 2020-07-05 MED ORDER — ALUM & MAG HYDROXIDE-SIMETH 200-200-20 MG/5ML PO SUSP: 30.0000 mL | ORAL | Status: DC | PRN

## 2020-07-05 MED ORDER — ONDANSETRON HCL 4 MG PO TABS: 4.0000 mg | ORAL_TABLET | Freq: Four times a day (QID) | ORAL | Status: DC | PRN

## 2020-07-05 MED ORDER — SODIUM CHLORIDE 0.9 % IR SOLN
Status: DC | PRN
  Administered 2020-07-05: 500 mL

## 2020-07-05 MED ORDER — ISOPROPYL ALCOHOL 70 % SOLN
Status: AC
  Filled 2020-07-05: qty 480

## 2020-07-05 MED ORDER — LIDOCAINE 2% (20 MG/ML) 5 ML SYRINGE
INTRAMUSCULAR | Status: AC
  Filled 2020-07-05: qty 5

## 2020-07-05 MED ORDER — PROMETHAZINE HCL 25 MG/ML IJ SOLN: 6.2500 mg | INTRAMUSCULAR | Status: DC | PRN

## 2020-07-05 MED ORDER — HYDROMORPHONE HCL 1 MG/ML IJ SOLN
0.2500 mg | INTRAMUSCULAR | Status: DC | PRN
  Administered 2020-07-05 (×2): 0.5 mg via INTRAVENOUS

## 2020-07-05 MED ORDER — METHOCARBAMOL 500 MG IVPB - SIMPLE MED
500.0000 mg | Freq: Four times a day (QID) | INTRAVENOUS | Status: DC | PRN
  Filled 2020-07-05: qty 50

## 2020-07-05 MED ORDER — POVIDONE-IODINE 10 % EX SWAB: 2.0000 "application " | Freq: Once | CUTANEOUS | Status: DC

## 2020-07-05 MED ORDER — BUPIVACAINE-EPINEPHRINE (PF) 0.25% -1:200000 IJ SOLN
INTRAMUSCULAR | Status: AC
  Filled 2020-07-05: qty 30

## 2020-07-05 MED ORDER — METOCLOPRAMIDE HCL 5 MG/ML IJ SOLN: 5.0000 mg | Freq: Three times a day (TID) | INTRAMUSCULAR | Status: DC | PRN

## 2020-07-05 MED ORDER — KETOROLAC TROMETHAMINE 30 MG/ML IJ SOLN
INTRAMUSCULAR | Status: DC | PRN
  Administered 2020-07-05: 30 mg via INTRA_ARTICULAR

## 2020-07-05 MED ORDER — HYDROMORPHONE HCL 1 MG/ML IJ SOLN: 0.5000 mg | INTRAMUSCULAR | Status: DC | PRN

## 2020-07-05 MED ORDER — STERILE WATER FOR IRRIGATION IR SOLN
Status: DC | PRN
  Administered 2020-07-05: 2000 mL

## 2020-07-05 MED ORDER — POLYETHYLENE GLYCOL 3350 17 G PO PACK: 17.0000 g | PACK | Freq: Every day | ORAL | Status: DC | PRN

## 2020-07-05 MED ORDER — SODIUM CHLORIDE (PF) 0.9 % IJ SOLN
INTRAMUSCULAR | Status: DC | PRN
  Administered 2020-07-05: 30 mL

## 2020-07-05 MED ORDER — METOCLOPRAMIDE HCL 5 MG PO TABS: 5.0000 mg | ORAL_TABLET | Freq: Three times a day (TID) | ORAL | Status: DC | PRN

## 2020-07-05 MED ORDER — ACETAMINOPHEN 10 MG/ML IV SOLN
1000.0000 mg | Freq: Once | INTRAVENOUS | Status: AC
  Administered 2020-07-05: 1000 mg via INTRAVENOUS
  Filled 2020-07-05: qty 100

## 2020-07-05 MED ORDER — PROMETHAZINE HCL 25 MG PO TABS: 25.0000 mg | ORAL_TABLET | Freq: Three times a day (TID) | ORAL | Status: DC | PRN

## 2020-07-05 MED ORDER — SERTRALINE HCL 100 MG PO TABS
200.0000 mg | ORAL_TABLET | Freq: Every day | ORAL | Status: DC
  Administered 2020-07-06 – 2020-07-07 (×2): 200 mg via ORAL
  Filled 2020-07-05 (×2): qty 2

## 2020-07-05 MED ORDER — EPHEDRINE 5 MG/ML INJ
INTRAVENOUS | Status: AC
  Filled 2020-07-05: qty 10

## 2020-07-05 MED ORDER — ZOLPIDEM TARTRATE 5 MG PO TABS: 5.0000 mg | ORAL_TABLET | Freq: Every evening | ORAL | Status: DC | PRN

## 2020-07-05 MED ORDER — OXYCODONE HCL 5 MG PO TABS
10.0000 mg | ORAL_TABLET | ORAL | Status: DC | PRN
  Administered 2020-07-06 – 2020-07-07 (×4): 15 mg via ORAL
  Filled 2020-07-05: qty 3
  Filled 2020-07-05: qty 2
  Filled 2020-07-05 (×4): qty 3

## 2020-07-05 MED ORDER — VANCOMYCIN HCL IN DEXTROSE 1-5 GM/200ML-% IV SOLN
1000.0000 mg | Freq: Two times a day (BID) | INTRAVENOUS | Status: AC
  Administered 2020-07-05: 1000 mg via INTRAVENOUS
  Filled 2020-07-05: qty 200

## 2020-07-05 MED ORDER — VANCOMYCIN HCL IN DEXTROSE 1-5 GM/200ML-% IV SOLN
1000.0000 mg | Freq: Once | INTRAVENOUS | Status: AC
  Administered 2020-07-05: 1000 mg via INTRAVENOUS
  Administered 2020-07-05: 200 mL via INTRAVENOUS
  Filled 2020-07-05: qty 200

## 2020-07-05 MED ORDER — DEXAMETHASONE SODIUM PHOSPHATE 10 MG/ML IJ SOLN
INTRAMUSCULAR | Status: AC
  Filled 2020-07-05: qty 1

## 2020-07-05 MED ORDER — PROCHLORPERAZINE MALEATE 10 MG PO TABS: 10.0000 mg | ORAL_TABLET | Freq: Four times a day (QID) | ORAL | Status: DC | PRN

## 2020-07-05 MED ORDER — ONDANSETRON HCL 4 MG/2ML IJ SOLN
INTRAMUSCULAR | Status: AC
  Filled 2020-07-05: qty 2

## 2020-07-05 MED ORDER — METHOCARBAMOL 500 MG PO TABS
500.0000 mg | ORAL_TABLET | Freq: Four times a day (QID) | ORAL | Status: DC | PRN
  Administered 2020-07-05 – 2020-07-06 (×2): 500 mg via ORAL
  Filled 2020-07-05 (×3): qty 1

## 2020-07-05 MED ORDER — CHLORHEXIDINE GLUCONATE CLOTH 2 % EX PADS
6.0000 | MEDICATED_PAD | Freq: Every day | CUTANEOUS | Status: DC
  Administered 2020-07-06 – 2020-07-07 (×2): 6 via TOPICAL

## 2020-07-05 SURGICAL SUPPLY — 94 items
ADAPTER OFFSET W/SCREWS 5 (Joint) ×2 IMPLANT
AGMNT FEM LL/RM W/BT KNEE (Miscellaneous) ×2 IMPLANT
AGMNT FEM W/BLT 5/60 KNEE (Miscellaneous) ×2 IMPLANT
AUG BMT 360 TIB SM CRUC WING (Joint) ×2 IMPLANT
AUGMENT BMT 360 TIB SM CRC WNG (Joint) ×1 IMPLANT
AUGMENT FEM LL/RM W/BT KNEE (Miscellaneous) ×1 IMPLANT
AUGMENT FEM W/BLT 5/60 KNEE (Miscellaneous) ×1 IMPLANT
AUGMENT VG 360 UNI PST FM 60X5 (Miscellaneous) ×2 IMPLANT
BAG DECANTER FOR FLEXI CONT (MISCELLANEOUS) ×4 IMPLANT
BAG ZIPLOCK 12X15 (MISCELLANEOUS) ×2 IMPLANT
BEARING PS TIBIAL KN (Knees) ×1 IMPLANT
BIT DRILL QUICK REL 1/8 2PK SL (DRILL) ×2 IMPLANT
BLADE SAW RECIPROCATING 77.5 (BLADE) ×2 IMPLANT
BLADE SAW SGTL 81X20 HD (BLADE) ×4 IMPLANT
BLADE SURG SZ10 CARB STEEL (BLADE) ×2 IMPLANT
BNDG ELASTIC 4X5.8 VLCR STR LF (GAUZE/BANDAGES/DRESSINGS) ×2 IMPLANT
BNDG ELASTIC 6X5.8 VLCR STR LF (GAUZE/BANDAGES/DRESSINGS) ×2 IMPLANT
CEMENT BONE REFOBACIN R1X40 US (Cement) ×8 IMPLANT
CHLORAPREP W/TINT 26 (MISCELLANEOUS) ×4 IMPLANT
COMP FEM LFT W/SCRW 62.5 KNEE (Miscellaneous) ×2 IMPLANT
COMPONENT FEM LFT W/SCRW 62.5 (Miscellaneous) ×1 IMPLANT
CONS PS TIBIAL BEAR KNEE (Knees) ×2 IMPLANT
COVER SURGICAL LIGHT HANDLE (MISCELLANEOUS) ×2 IMPLANT
COVER WAND RF STERILE (DRAPES) ×2 IMPLANT
CUFF TOURN SGL QUICK 34 (TOURNIQUET CUFF) ×2
CUFF TRNQT CYL 34X4.125X (TOURNIQUET CUFF) ×1 IMPLANT
DECANTER SPIKE VIAL GLASS SM (MISCELLANEOUS) IMPLANT
DERMABOND ADVANCED (GAUZE/BANDAGES/DRESSINGS) ×2
DERMABOND ADVANCED .7 DNX12 (GAUZE/BANDAGES/DRESSINGS) ×2 IMPLANT
DRAPE SHEET LG 3/4 BI-LAMINATE (DRAPES) ×6 IMPLANT
DRAPE U-SHAPE 47X51 STRL (DRAPES) ×2 IMPLANT
DRESSING AQUACEL AG SP 3.5X10 (GAUZE/BANDAGES/DRESSINGS) ×1 IMPLANT
DRILL QUICK RELEASE 1/8 INCH (DRILL) ×4
DRSG AQUACEL AG ADV 3.5X10 (GAUZE/BANDAGES/DRESSINGS) ×2 IMPLANT
DRSG AQUACEL AG SP 3.5X10 (GAUZE/BANDAGES/DRESSINGS) ×2
DRSG TEGADERM 4X4.75 (GAUZE/BANDAGES/DRESSINGS) IMPLANT
ELECT BLADE TIP CTD 4 INCH (ELECTRODE) ×2 IMPLANT
ELECT REM PT RETURN 15FT ADLT (MISCELLANEOUS) ×2 IMPLANT
EVACUATOR 1/8 PVC DRAIN (DRAIN) ×2 IMPLANT
GLOVE BIO SURGEON STRL SZ8.5 (GLOVE) ×8 IMPLANT
GLOVE BIOGEL PI IND STRL 7.0 (GLOVE) ×1 IMPLANT
GLOVE BIOGEL PI IND STRL 7.5 (GLOVE) ×1 IMPLANT
GLOVE BIOGEL PI IND STRL 8.5 (GLOVE) ×1 IMPLANT
GLOVE BIOGEL PI INDICATOR 7.0 (GLOVE) ×1
GLOVE BIOGEL PI INDICATOR 7.5 (GLOVE) ×1
GLOVE BIOGEL PI INDICATOR 8.5 (GLOVE) ×1
GOWN SPEC L3 XXLG W/TWL (GOWN DISPOSABLE) ×2 IMPLANT
GOWN STRL REUS W/TWL XL LVL3 (GOWN DISPOSABLE) ×2 IMPLANT
HANDPIECE INTERPULSE COAX TIP (DISPOSABLE) ×2
HOLDER FOLEY CATH W/STRAP (MISCELLANEOUS) IMPLANT
HOOD PEEL AWAY FLYTE STAYCOOL (MISCELLANEOUS) ×8 IMPLANT
JET LAVAGE IRRISEPT WOUND (IRRIGATION / IRRIGATOR) ×2
KIT TURNOVER KIT A (KITS) IMPLANT
LAVAGE JET IRRISEPT WOUND (IRRIGATION / IRRIGATOR) ×1 IMPLANT
MANIFOLD NEPTUNE II (INSTRUMENTS) ×2 IMPLANT
MARKER SKIN DUAL TIP RULER LAB (MISCELLANEOUS) ×2 IMPLANT
NDL SAFETY ECLIPSE 18X1.5 (NEEDLE) ×1 IMPLANT
NEEDLE HYPO 18GX1.5 SHARP (NEEDLE) ×2
NEEDLE SPNL 18GX3.5 QUINCKE PK (NEEDLE) ×2 IMPLANT
NS IRRIG 1000ML POUR BTL (IV SOLUTION) ×2 IMPLANT
OSTEOTOME THIN 10.0 3 (INSTRUMENTS) ×2 IMPLANT
PACK TOTAL KNEE CUSTOM (KITS) ×2 IMPLANT
PADDING CAST COTTON 6X4 STRL (CAST SUPPLIES) ×2 IMPLANT
PENCIL SMOKE EVACUATOR (MISCELLANEOUS) ×2 IMPLANT
PROTECTOR NERVE ULNAR (MISCELLANEOUS) ×2 IMPLANT
SAW OSC TIP CART 19.5X105X1.3 (SAW) ×2 IMPLANT
SEALER BIPOLAR AQUA 6.0 (INSTRUMENTS) ×2 IMPLANT
SET HNDPC FAN SPRY TIP SCT (DISPOSABLE) ×1 IMPLANT
SET PAD KNEE POSITIONER (MISCELLANEOUS) ×2 IMPLANT
SPONGE DRAIN TRACH 4X4 STRL 2S (GAUZE/BANDAGES/DRESSINGS) ×2 IMPLANT
SPONGE LAP 18X18 RF (DISPOSABLE) IMPLANT
STEM SPLINED KNEE 16MMX80MM (Joint) ×2 IMPLANT
STEM SPLINED KNEE 18MMX120MM (Joint) ×2 IMPLANT
SUT MNCRL AB 3-0 PS2 18 (SUTURE) ×2 IMPLANT
SUT MNCRL AB 4-0 PS2 18 (SUTURE) ×2 IMPLANT
SUT MON AB 2-0 CT1 36 (SUTURE) ×4 IMPLANT
SUT MON AB 5-0 PS2 18 (SUTURE) ×2 IMPLANT
SUT STRATAFIX PDO 1 14 VIOLET (SUTURE) ×2
SUT STRATFX PDO 1 14 VIOLET (SUTURE) ×1
SUT VIC AB 1 CTX 36 (SUTURE) ×4
SUT VIC AB 1 CTX36XBRD ANBCTR (SUTURE) ×2 IMPLANT
SUT VIC AB 2-0 CT1 27 (SUTURE) ×2
SUT VIC AB 2-0 CT1 TAPERPNT 27 (SUTURE) ×1 IMPLANT
SUTURE STRATFX PDO 1 14 VIOLET (SUTURE) ×1 IMPLANT
SWAB COLLECTION DEVICE MRSA (MISCELLANEOUS) ×2 IMPLANT
SWAB CULTURE ESWAB REG 1ML (MISCELLANEOUS) ×2 IMPLANT
SYR 3ML LL SCALE MARK (SYRINGE) ×2 IMPLANT
TOWER CARTRIDGE SMART MIX (DISPOSABLE) ×4 IMPLANT
TRAY FOLEY MTR SLVR 14FR STAT (SET/KITS/TRAYS/PACK) ×2 IMPLANT
TRAY TIBIAL W/TI LOCK SZ 67 (Knees) ×2 IMPLANT
WATER STERILE IRR 1000ML POUR (IV SOLUTION) ×2 IMPLANT
WRAP KNEE MAXI GEL POST OP (GAUZE/BANDAGES/DRESSINGS) ×2 IMPLANT
YANKAUER SUCT BULB TIP 10FT TU (MISCELLANEOUS) ×6 IMPLANT
YANKAUER SUCT BULB TIP NO VENT (SUCTIONS) ×2 IMPLANT

## 2020-07-05 NOTE — Interval H&P Note (Signed)
History and Physical Interval Note:  07/05/2020 7:33 AM  Traci Bird  has presented today for surgery, with the diagnosis of failed left total knee arthroplasty.  The various methods of treatment have been discussed with the patient and family. After consideration of risks, benefits and other options for treatment, the patient has consented to  Procedure(s): TOTAL KNEE REVISION (Left) as a surgical intervention.  The patient's history has been reviewed, patient examined, no change in status, stable for surgery.  I have reviewed the patient's chart and labs.  Questions were answered to the patient's satisfaction.     Hilton Cork [place]in Swing

## 2020-07-05 NOTE — Evaluation (Signed)
Physical Therapy Evaluation Patient Details Name: Traci Bird MRN: [id number] DOB: Oct 08, 1961 Today's Date: 07/05/2020   History of Present Illness  Patient is 59 y.o. female s/p Lt TKR on 07/05/20 with PMH significant for GERD, DM, OA, anxiety, depression, Lt TKA In 2016, gastric bypass in 2008.    Clinical Impression  Traci Bird is a 59 y.o. female POD 0 s/p Lt TKA. Patient reports independence with mobility at baseline. Patient is now limited by functional impairments (see PT problem list below) and requires min assist for transfers and gait with RW. Patient was able to ambulate ~8 feet with RW and min assist and was limited today by pain. Patient instructed in exercise to facilitate circulation. Patient will benefit from continued skilled PT interventions to address impairments and progress towards PLOF. Acute PT will follow to progress mobility and stair training in preparation for safe discharge home.     Follow Up Recommendations Follow surgeon's recommendation for DC plan and follow-up therapies;Outpatient PT    Equipment Recommendations  Rolling walker with 5" wheels (youth walker)    Recommendations for Other Services       Precautions / Restrictions Precautions Precautions: Fall Precaution Comments: 6-8 falls in the last 6 months Restrictions Weight Bearing Restrictions: No LLE Weight Bearing: Weight bearing as tolerated      Mobility  Bed Mobility Overal bed mobility: Needs Assistance Bed Mobility: Supine to Sit     Supine to sit: Min assist     General bed mobility comments: VC's for use of bed rail and assist to scoot to EOB.  Transfers Overall transfer level: Needs assistance Equipment used: Rolling walker (2 wheeled) Transfers: Sit to/from Stand Sit to Stand: Min assist         General transfer comment: VC's for safe technique with RW, assist to initiate and complete power up.   Ambulation/Gait Ambulation/Gait assistance: Min assist Gait  Distance (Feet): 8 Feet Assistive device: Rolling walker (2 wheeled) Gait Pattern/deviations: Step-to pattern;Decreased stride length;Decreased stance time - left;Decreased weight shift to left Gait velocity: decr   General Gait Details: VC's for safe step pattern/proximity to RW. no overt LOB noted. pt limited by pain and c/o SOB (reports may be related to face mask). pt SpO2 at 95% on RA and HR in 90's.  Stairs            Wheelchair Mobility    Modified Rankin (Stroke Patients Only)       Balance Overall balance assessment: Needs assistance Sitting-balance support: Feet supported Sitting balance-Leahy Scale: Good     Standing balance support: During functional activity;Bilateral upper extremity supported Standing balance-Leahy Scale: Poor                Pertinent Vitals/Pain Pain Assessment: 0-10 Pain Score: 6  Pain Location: Lt knee Pain Descriptors / Indicators: Aching;Discomfort Pain Intervention(s): Limited activity within patient's tolerance;Monitored during session;Repositioned;Ice applied    Home Living Family/patient expects to be discharged to:: Private residence Living Arrangements: Spouse/significant other;Other relatives (4 and 75 y.o. grandsons live with pt and can help) Available Help at Discharge: Family Type of Home: House Home Access: Stairs to enter Entrance Stairs-Rails: None Entrance Stairs-Number of Steps: 2+1 Home Layout: One level Home Equipment: Environmental consultant - 2 wheels;Bedside commode;Walker - 4 wheels;Cane - single point      Prior Function Level of Independence: Independent         Comments: pt using SPC occasionally     Hand Dominance   Dominant Hand: Right  Extremity/Trunk Assessment   Upper Extremity Assessment Upper Extremity Assessment: Overall WFL for tasks assessed    Lower Extremity Assessment Lower Extremity Assessment: LLE deficits/detail LLE Deficits / Details: good quad activation, no extensor lag with  SLR LLE Sensation: WNL LLE Coordination: WNL    Cervical / Trunk Assessment Cervical / Trunk Assessment: Normal  Communication   Communication: No difficulties  Cognition Arousal/Alertness: Awake/alert Behavior During Therapy: WFL for tasks assessed/performed Overall Cognitive Status: Within Functional Limits for tasks assessed                 General Comments      Exercises Total Joint Exercises Ankle Circles/Pumps: AROM;Both;20 reps;Seated   Assessment/Plan    PT Assessment Patient needs continued PT services  PT Problem List Decreased strength;Decreased range of motion;Decreased activity tolerance;Decreased balance;Decreased mobility;Decreased knowledge of use of DME;Pain       PT Treatment Interventions DME instruction;Gait training;Stair training;Functional mobility training;Therapeutic activities;Therapeutic exercise;Balance training;Patient/family education    PT Goals (Current goals can be found in the Care Plan section)  Acute Rehab PT Goals Patient Stated Goal: get independent again and not have knee pain PT Goal Formulation: With patient Time For Goal Achievement: 07/12/20 Potential to Achieve Goals: Good    Frequency 7X/week    AM-PAC PT "6 Clicks" Mobility  Outcome Measure Help needed turning from your back to your side while in a flat bed without using bedrails?: A Little Help needed moving from lying on your back to sitting on the side of a flat bed without using bedrails?: A Little Help needed moving to and from a bed to a chair (including a wheelchair)?: A Little Help needed standing up from a chair using your arms (e.g., wheelchair or bedside chair)?: A Little Help needed to walk in hospital room?: A Little Help needed climbing 3-5 steps with a railing? : A Little 6 Click Score: 18    End of Session Equipment Utilized During Treatment: Gait belt Activity Tolerance: Patient limited by pain;Patient tolerated treatment well Patient left: in  chair;with call bell/phone within reach;with chair alarm set;with family/visitor present Nurse Communication: Mobility status;Patient requests pain meds PT Visit Diagnosis: Muscle weakness (generalized) (M62.81);Difficulty in walking, not elsewhere classified (R26.2)    Time: 6389-3734 PT Time Calculation (min) (ACUTE ONLY): 26 min   Charges:   PT Evaluation $PT Eval Low Complexity: 1 Low PT Treatments $Gait Training: 8-22 mins      Verner Mould, DPT Acute Rehabilitation Services  Office [phone] Pager [phone]  07/05/2020 5:39 PM

## 2020-07-05 NOTE — Anesthesia Postprocedure Evaluation (Signed)
Anesthesia Post Note  Patient: BARRIE SIGMUND  Procedure(s) Performed: LEFT TOTAL KNEE REVISION ARTHROPLASTY (Left Knee)     Patient location during evaluation: PACU Anesthesia Type: Regional and Spinal Level of consciousness: oriented and awake and alert Pain management: pain level controlled Vital Signs Assessment: post-procedure vital signs reviewed and stable Respiratory status: spontaneous breathing, respiratory function stable and patient connected to nasal cannula oxygen Cardiovascular status: blood pressure returned to baseline and stable Postop Assessment: no headache, no backache and no apparent nausea or vomiting Anesthetic complications: no   No complications documented.  Last Vitals:  Vitals:   07/05/20 1706 07/05/20 2109  BP: (!) 100/60 (!) 91/52  Pulse: 70 72  Resp: 17 16  Temp: 36.4 C 36.8 C  SpO2: (!) 88% 94%    Last Pain:  Vitals:   07/05/20 2109  TempSrc: Oral  PainSc:                  Karyl Kinnier Samanvi Cuccia

## 2020-07-05 NOTE — Transfer of Care (Signed)
Immediate Anesthesia Transfer of Care Note  Patient: ELAIJAH MUNOZ  Procedure(s) Performed: LEFT TOTAL KNEE REVISION ARTHROPLASTY (Left Knee)  Patient Location: PACU  Anesthesia Type:Spinal and MAC combined with regional for post-op pain  Level of Consciousness: awake, alert  and oriented  Airway & Oxygen Therapy: Patient Spontanous Breathing and Patient connected to face mask oxygen  Post-op Assessment: Report given to RN and Post -op Vital signs reviewed and stable  Post vital signs: Reviewed and stable  Last Vitals:  Vitals Value Taken Time  BP    Temp    Pulse 67 07/05/20 1234  Resp 20 07/05/20 1234  SpO2 97 % 07/05/20 1234  Vitals shown include unvalidated device data.  Last Pain:  Vitals:   07/05/20 0629  TempSrc:   PainSc: 4       Patients Stated Pain Goal: 3 (44/03/47 4259)  Complications: No complications documented.

## 2020-07-05 NOTE — Anesthesia Procedure Notes (Signed)
Anesthesia Regional Block: Adductor canal block   Pre-Anesthetic Checklist: ,, timeout performed, Correct Patient, Correct Site, Correct Laterality, Correct Procedure,, site marked, risks and benefits discussed, Surgical consent,  Pre-op evaluation,  At surgeon's request and post-op pain management  Laterality: Left  Prep: chloraprep       Needles:  Injection technique: Single-shot  Needle Type: Echogenic Stimulator Needle     Needle Length: 10cm  Needle Gauge: 20     Additional Needles:   Procedures:,,,, ultrasound used (permanent image in chart),,,,  Narrative:  Start time: 07/05/2020 6:55 AM End time: 07/05/2020 7:05 AM Injection made incrementally with aspirations every 5 mL.  Performed by: Personally  Anesthesiologist: Murvin Natal, MD  Additional Notes: Functioning IV was confirmed and monitors were applied. A time-out was performed. Hand hygiene and sterile gloves were used. The thigh was placed in a frog-leg position and prepped in a sterile fashion. A 138mm 20ga BBraun echogenic stimulator needle was placed using ultrasound guidance.  Negative aspiration and negative test dose prior to incremental administration of local anesthetic. The patient tolerated the procedure well.

## 2020-07-05 NOTE — Op Note (Signed)
OPERATIVE REPORT   07/05/2020  12:00 PM  PATIENT:  Traci Bird   SURGEON:  Bertram Savin, MD  ASSISTANT:  Cherlynn June, PA-C. Nehemiah Massed, PA-C.   PREOPERATIVE DIAGNOSIS:  Failed left total knee arthroplasty.  POSTOPERATIVE DIAGNOSIS:  Same.  PROCEDURE: Revision left total knee arthroplasty, femoral and tibial components.  ANESTHESIA:   GETA.  ANTIBIOTICS: 2 g Ancef. 1 g vancomycin.  IMPLANTS: 1. Biomet Vanguard 360 revision system size 62.5 femoral component with 5 mm distal medial augment, 5 mm distal lateral augment, and 5 mm posterior lateral augment; 18 x 120 mm splined stem. 2.  Size 67 tibial tray with small cruciate wing, 5 mm offset adapter, and 16 x 80 mm splined stem. 3.  14 mm TS ArCom tibial bearing. 4.  Refobacin bone cement R.  EXPLANTS: DePuy Sigma size 2.5 PS femur, size 2.5 RP tibial tray, and 10 mm RP polyinsert.  SPECIMENS:  Left knee tibial interface membrane for culture.  TOURNIQUET TIME: 24 min.  COMPLICATIONS: None.  DISPOSITION: Stable to PACU.  SURGICAL INDICATIONS:  Traci Bird is a 59 y.o. 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  Preop inflammatory markers were negative.  Preop CT scan showed slight internal rotation of the femoral component with medial patellar tilting and lateral subluxation.  She was indicated for revision total knee arthroplasty femoral and tibial components.  She understands that the patellar component will be inspected intraoperatively and possibly revised.  PROCEDURE IN DETAIL: PROCEDURE IN DETAIL: The patient was identified in the holding area using 2 identifiers.  The surgical site was marked by myself.  Adductor canal block was placed by anesthesia.  Patient was taken the operating room, placed supine on the operating room table.  General anesthesia was induced.  Foley catheter was inserted.   All bony prominences were well-padded.  Nonsterile tourniquet was applied to the right thigh.  The right lower extremity was prepped and draped in the normal sterile surgical fashion.  Timeout was called, verifying site and site of surgery.  She did receive IV antibiotics within 60 minutes of beginning the procedure.  Please note that the surgical approach was performed with a tourniquet deflated.  Previous anterior skin incision was utilized.  The scar was excisionally removed with a #10 blade.  Full-thickness skin flaps were created.  Standard medial parapatellar arthrotomy was created.  Joint fluid was clear and straw colored. There was no purulence or evidence of infection. Medial release was performed.   I brought the knee into full extension.  I performed a radical synovectomy of the medial gutter, lateral gutter, and suprapatellar pouch.  Infrapatellar scar was sharply excised with Bovie electrocautery.  Using an osteotome, I amputated the post of the RP polyliner.  The liner was removed.    Femoral component was stable.  ACL saw and flexible osteotomes were used to loosen the cement implant interface of the femoral component. Using a bone tamp, I was able to easily remove the femoral component without any significant bone loss.   I then turned my attention to the tibia.    Scar tissue was debrided from the interface of the proximal tibia and the implant.  The tibial component was grossly loose.  I was able to easily remove the tibial component with a bone tamp.  The tibial component was debonded from the cement.  Using osteotomes I removed the cement  from the proximal tibia.  Entry drill was used to gain access to the medullary canal of the tibia and femur.    Culture swab from proximal tibial bone surface was obtained for routine purposes and sent to the lab.  There is no purulence or evidence of infection.  Using osteotomes and back scratchers, the canals were debrided.  The tibia was reamed  up to a size 16 mm reamer with excellent cortical chatter.  Using the reamer as IM reference, proximal tibia cut was freshened taking a skim cut off the medial surface.  I sized the proximal tibia to a size 67.  5 mm adapter coin was used to determine the appropriate rotation and offset.  The tibial tray was pinned and the keel was punched.  There was no significant bony defect in the proximal tibia.  The trial tibial component was in assembled on the back table and inserted into the tibia.  I then turned my attention to the femur.  I reamed sequentially up to an 18 mm reamer with excellent chatter.  Distal femoral skim cut was created with 5 degrees of valgus alignment.  I elected to place 5 mm distal augments based on comparison of flexion and extension gaps. I placed a 5 mm posterior lateral augment to increase external rotation. The femur was sized to a 62.5 mm implant. Trial implant was placed.  The femoral trial was pinned and the box cut was made.  The femoral trial was assembled on the back table and inserted.  A 14 mm trial polyliner was placed.  There was excellent balance of the flexion and extension gaps.  The knee was stable to varus/valgus stress throughout a range of motion.  The patella tracked centrally using the no thumbs technique.  The patellar component was inspected. Scar tissue was debrided. There was no wear or loosening.  Therefore, I elected to retain the patellar component.  All the trial implants were removed.  The lower extremity was exsanguinated with gravity, and tourniquet was elevated to 250 mmHg.  Sclerotic subchondral bone was drilled with a drill pin.  The cut bony surfaces were irrigated with pulse lavage and then dried.  The real implants were opened and assembled on the back table.  The tibia followed by the femoral components were cemented into place using a hybrid fixation technique.  Trial insert was placed and the knee was brought into extension with the cement  polymerized.  Excess cement was cleared.  Once the cement was fully hardened, the trial liner was exchanged for the real liner, and the locking bar was placed.  Repeat stability testing was performed and was unchanged.  The tourniquet was then let down.  Meticulous hemostasis was achieved with aqua mantis and Bovie electrocautery.  The knee was irrigated with irrisept solution followed by normal saline with pulse lavage.  The knee was closed in layers with #1 Vicryl and strata fix for the arthrotomy, 2-0 Vicryl for the deep fatty layer, 2-0 Monocryl for the deep dermal layer, and 3-0 Monocryl for the subcuticular skin. 4-0 Monocryl stay sutures were placed at the ends of the incision, and Dermabond was applied to the skin.  Once the glue was fully dried, Aquacel dressing was applied.  A bulky padded dressing was applied followed by an Ace wrap.  Patient was then extubated and taken to the PACU in stable condition.  Sponge, needle, and instrument counts were correct at the end of the case x2.  There were no known  complications.  POSTOPERATIVE PLAN: Postoperatively, the patient be admitted to the hospital.  She may weight-bear as tolerated left lower extremity with a walker.  Begin apixaban for DVT prophylaxis beginning tomorrow morning.  Mobilize out of bed with physical therapy.  Routine IV antibiotics. Follow intraoperative cultures.  Discharge home when medically ready.  Plan for outpatient physical therapy.

## 2020-07-05 NOTE — Anesthesia Procedure Notes (Signed)
Spinal  Patient location during procedure: OR Start time: 07/05/2020 7:45 AM End time: 07/05/2020 7:50 AM Staffing Performed: anesthesiologist  Anesthesiologist: Murvin Natal, MD Preanesthetic Checklist Completed: patient identified, IV checked, risks and benefits discussed, surgical consent, monitors and equipment checked, pre-op evaluation and timeout performed Spinal Block Patient position: sitting Prep: DuraPrep Patient monitoring: cardiac monitor, continuous pulse ox and blood pressure Approach: midline Location: L4-5 Injection technique: single-shot Needle Needle type: Pencan  Needle gauge: 24 G Needle length: 9 cm Assessment Sensory level: T10 Additional Notes Functioning IV was confirmed and monitors were applied. Sterile prep and drape, including hand hygiene and sterile gloves were used. The patient was positioned and the spine was prepped. The skin was anesthetized with lidocaine.  Free flow of clear CSF was obtained prior to injecting local anesthetic into the CSF.  The spinal needle aspirated freely following injection.  The needle was carefully withdrawn.  The patient tolerated the procedure well.

## 2020-07-06 ENCOUNTER — Encounter (HOSPITAL_COMMUNITY): Payer: Self-pay | Admitting: Orthopedic Surgery

## 2020-07-06 LAB — CBC
HCT: 30.9 % — ABNORMAL LOW (ref 36.0–46.0)
Hemoglobin: 9.2 g/dL — ABNORMAL LOW (ref 12.0–15.0)
MCH: 27.6 pg (ref 26.0–34.0)
MCHC: 29.8 g/dL — ABNORMAL LOW (ref 30.0–36.0)
MCV: 92.8 fL (ref 80.0–100.0)
Platelets: 123 10*3/uL — ABNORMAL LOW (ref 150–400)
RBC: 3.33 MIL/uL — ABNORMAL LOW (ref 3.87–5.11)
RDW: 13.2 % (ref 11.5–15.5)
WBC: 7.4 10*3/uL (ref 4.0–10.5)
nRBC: 0 % (ref 0.0–0.2)

## 2020-07-06 LAB — BASIC METABOLIC PANEL
Anion gap: 3 — ABNORMAL LOW (ref 5–15)
BUN: 12 mg/dL (ref 6–20)
CO2: 26 mmol/L (ref 22–32)
Calcium: 8.2 mg/dL — ABNORMAL LOW (ref 8.9–10.3)
Chloride: 110 mmol/L (ref 98–111)
Creatinine, Ser: 0.67 mg/dL (ref 0.44–1.00)
GFR calc Af Amer: >60 mL/min (ref >60)
GFR calc non Af Amer: >60 mL/min (ref >60)
Glucose, Bld: 131 mg/dL — ABNORMAL HIGH (ref 70–99)
Potassium: 4.6 mmol/L (ref 3.5–5.1)
Sodium: 139 mmol/L (ref 135–145)

## 2020-07-06 MED ORDER — DOCUSATE SODIUM 100 MG PO CAPS: 100.0000 mg | ORAL_CAPSULE | Freq: Two times a day (BID) | ORAL | 0 refills | Status: DC

## 2020-07-06 MED ORDER — APIXABAN 2.5 MG PO TABS: 2.5000 mg | ORAL_TABLET | Freq: Two times a day (BID) | ORAL | 0 refills | Status: DC

## 2020-07-06 MED ORDER — ONDANSETRON HCL 4 MG PO TABS: 4.0000 mg | ORAL_TABLET | Freq: Four times a day (QID) | ORAL | 0 refills | Status: DC | PRN

## 2020-07-06 MED ORDER — SENNA 8.6 MG PO TABS: 2.0000 | ORAL_TABLET | Freq: Every day | ORAL | 0 refills | Status: AC

## 2020-07-06 MED ORDER — SODIUM CHLORIDE 0.9 % IV BOLUS
1000.0000 mL | Freq: Once | INTRAVENOUS | Status: AC
  Administered 2020-07-06: 1000 mL via INTRAVENOUS

## 2020-07-06 MED ORDER — OXYCODONE HCL 10 MG PO TABS: 10.0000 mg | ORAL_TABLET | ORAL | 0 refills | Status: DC | PRN

## 2020-07-06 NOTE — Progress Notes (Signed)
Subjective:  Patient reports pain as mild to moderate.  Denies N/V/CP/SOB.   Objective:   VITALS:   Vitals:   07/05/20 1706 07/05/20 2109 07/06/20 0140 07/06/20 0552  BP: (!) 100/60 (!) 91/52 (!) 94/57 (!) 97/55  Pulse: 70 72 68 75  Resp: 17 16 15 16   Temp: 97.6 F (36.4 C) 98.3 F (36.8 C) 98 F (36.7 C) 99.1 F (37.3 C)  TempSrc: Oral Oral Oral Oral  SpO2: (!) 88% 94% 100% 100%  Weight:      Height:        NAD ABD soft Sensation intact distally Intact pulses distally Dorsiflexion/Plantar flexion intact Incision: dressing C/D/I Compartment soft   Lab Results  Component Value Date   WBC 7.4 07/06/2020   HGB 9.2 (L) 07/06/2020   HCT 30.9 (L) 07/06/2020   MCV 92.8 07/06/2020   PLT 123 (L) 07/06/2020   BMET    Component Value Date/Time   NA 139 07/06/2020 0251   K 4.6 07/06/2020 0251   CL 110 07/06/2020 0251   CO2 26 07/06/2020 0251   GLUCOSE 131 (H) 07/06/2020 0251   BUN 12 07/06/2020 0251   CREATININE 0.67 07/06/2020 0251   CALCIUM 8.2 (L) 07/06/2020 0251   GFRNONAA >60 07/06/2020 0251   GFRAA >60 07/06/2020 0251   Recent Results (from the past 240 hour(s))  Surgical pcr screen     Status: Abnormal   Collection Time: 06/26/20 11:57 AM   Specimen: Nasal Mucosa; Nasal Swab  Result Value Ref Range Status   MRSA, PCR POSITIVE (A) NEGATIVE Final    Comment: RESULT CALLED TO, READ BACK BY AND VERIFIED WITH: MOORE, M. RN @0911  ON 7.21.2021 BY NMCCOY    Staphylococcus aureus POSITIVE (A) NEGATIVE Final    Comment: (NOTE) The Xpert SA Assay (FDA approved for NASAL specimens in patients 26 years of age and older), is one component of a comprehensive surveillance program. It is not intended to diagnose infection nor to guide or monitor treatment. Performed at [facility], [place] [address] [place], [place] [address]   SARS CORONAVIRUS 2 (TAT 6-24 HRS) Nasopharyngeal Nasopharyngeal Swab     Status: None   Collection Time: 07/02/20  11:36 AM   Specimen: Nasopharyngeal Swab  Result Value Ref Range Status   SARS Coronavirus 2 NEGATIVE NEGATIVE Final    Comment: (NOTE) SARS-CoV-2 target nucleic acids are NOT DETECTED.  The SARS-CoV-2 RNA is generally detectable in upper and lower respiratory specimens during the acute phase of infection. Negative results do not preclude SARS-CoV-2 infection, do not rule out co-infections with other pathogens, and should not be used as the sole basis for treatment or other patient management decisions. Negative results must be combined with clinical observations, patient history, and epidemiological information. The expected result is Negative.  Fact Sheet for Patients: SugarRoll.be  Fact Sheet for Healthcare Providers: https://www.woods-mathews.com/  This test is not yet approved or cleared by the [place] FDA and  has been authorized for detection and/or diagnosis of SARS-CoV-2 by FDA under an Emergency Use Authorization (EUA). This EUA will remain  in effect (meaning this test can be used) for the duration of the COVID-19 declaration under Se ction 564(b)(1) of the Act, 21 U.S.C. section 360bbb-3(b)(1), unless the authorization is terminated or revoked sooner.  Performed at [facility], [place] [address] [place], [place] [address]   Aerobic/Anaerobic Culture (surgical/deep wound)     Status: None (Preliminary result)   Collection Time: 07/05/20  10:52 AM   Specimen: PATH Other; Tissue  Result Value Ref Range Status   Specimen Description   Final    TISSUE LEFT TIBIAL INTERFACE Performed at [place] [address], [place], [place] [address]    Special Requests   Final    NONE Performed at [facility], [place] [address] [place], [place] [address]    Gram Stain   Final    RARE WBC PRESENT, PREDOMINANTLY PMN NO ORGANISMS SEEN Performed at [facility], [place] [address], [place], [place] [address]    Culture PENDING  Incomplete   Report Status PENDING  Incomplete      Assessment/Plan: 1 Day Post-Op   Principal Problem:   Failed total left knee replacement (HCC) Active Problems:   Failed total knee, left ([place])   WBAT with walker DVT ppx: apixaban, SCDs, TEDS PO pain control PT/OT Hypotension: due to surgical blood loss/meds, BP soft o/n, asumptomatic, bolus 1 L NS and monitor Dispo: d/c home with OPPT when ready, likely tomorrow   Hilton Cork Shemica Meath 07/06/2020, 8:21 AM   Rod Can, MD [phone] [facility] now [facility]  [place] [address], [place], [place] [address] Phone: [phone] [email or web address]  [employer]

## 2020-07-06 NOTE — Discharge Summary (Signed)
Physician Discharge Summary  Patient ID: Traci Bird MRN: [id number] DOB/AGE: 01/16/1961 59 y.o.  Admit date: 07/05/2020 Discharge date: 07/07/2020  Admission Diagnoses:  Failed total left knee replacement [facility])  Discharge Diagnoses:  Principal Problem:   Failed total left knee replacement (HCC) Active Problems:   Failed total knee, left (HCC)   Past Medical History:  Diagnosis Date   Acute meniscal tear of knee LEFT   Anxiety    Arthritis HIPS , KNEES, HANDS   Chondromalacia of left knee    back   Depression    Diabetes mellitus without complication (HCC)    No meds since gastric bypass surgery   Family history of adverse reaction to anesthesia    Daughter PONV   GERD (gastroesophageal reflux disease)    History of diabetes mellitus NO ISSUE SINCE GASTRIC BYPASS   History of sleep apnea NO ISSUES SINCE GASTRIC BYPASS   Hypothyroidism    Partial removed benign tumor   Neuromuscular disorder (HCC)    sciatica   PONV (postoperative nausea and vomiting)    Sleep apnea    No longer has after gastric bypass surgery    Surgeries: Procedure(s): LEFT TOTAL KNEE REVISION ARTHROPLASTY on 07/05/2020   Consultants (if any):    Discharged Condition: Improved  Hospital Course: Traci Bird is an 59 y.o. female who was admitted 07/05/2020 with a diagnosis of Failed total left knee replacement ([place]) and went to the operating room on 07/05/2020 and underwent the above named procedures.    She was given perioperative antibiotics:  Anti-infectives (From admission, onward)    Start     Dose/Rate Route Frequency Ordered Stop   07/05/20 2000  vancomycin (VANCOCIN) IVPB 1000 mg/200 mL premix        1,000 mg 200 mL/hr over 60 Minutes Intravenous Every 12 hours 07/05/20 1439 07/05/20 2157   07/05/20 0600  vancomycin (VANCOCIN) IVPB 1000 mg/200 mL premix        1,000 mg 200 mL/hr over 60 Minutes Intravenous  Once 07/05/20 0532 07/05/20 0746   07/05/20 0600  ceFAZolin (ANCEF)  IVPB 2g/100 mL premix        2 g 200 mL/hr over 30 Minutes Intravenous On call to O.R. 07/05/20 0532 07/05/20 9622     .  She was given sequential compression devices, early ambulation, and apixaban for DVT prophylaxis.  She benefited maximally from the hospital stay and there were no complications.    Recent vital signs:  Vitals:   07/06/20 2055 07/07/20 0506  BP: 110/73 106/67  Pulse: 72 74  Resp: 16 18  Temp: 99.3 F (37.4 C) 99 F (37.2 C)  SpO2: 90% 94%    Recent laboratory studies:  Lab Results  Component Value Date   HGB 9.0 (L) 07/07/2020   HGB 9.2 (L) 07/06/2020   HGB 13.1 06/26/2020   Lab Results  Component Value Date   WBC 7.9 07/07/2020   PLT 121 (L) 07/07/2020   Lab Results  Component Value Date   INR 1.0 06/26/2020   Lab Results  Component Value Date   NA 139 07/06/2020   K 4.6 07/06/2020   CL 110 07/06/2020   CO2 26 07/06/2020   BUN 12 07/06/2020   CREATININE 0.67 07/06/2020   GLUCOSE 131 (H) 07/06/2020    Discharge Medications:   Allergies as of 07/07/2020       Reactions   Morphine And Related Itching   Pump    Nsaids Other (See Comments)  Gastric bypass surgery        Medication List     STOP taking these medications    acetaminophen 500 MG tablet Commonly known as: TYLENOL   gabapentin 600 MG tablet Commonly known as: NEURONTIN   oxyCODONE-acetaminophen 10-325 MG tablet Commonly known as: PERCOCET       TAKE these medications    apixaban 2.5 MG Tabs tablet Commonly known as: ELIQUIS Take 1 tablet (2.5 mg total) by mouth every 12 (twelve) hours.   busPIRone 15 MG tablet Commonly known as: BUSPAR Take 15 mg by mouth daily as needed (anxiety).   clonazePAM 0.5 MG tablet Commonly known as: KLONOPIN Take 0.5 mg by mouth 2 (two) times daily as needed for anxiety.   docusate sodium 100 MG capsule Commonly known as: COLACE Take 1 capsule (100 mg total) by mouth 2 (two) times daily.   levothyroxine 75 MCG  tablet Commonly known as: SYNTHROID Take 75 mcg by mouth at bedtime.   ondansetron 4 MG tablet Commonly known as: ZOFRAN Take 1 tablet (4 mg total) by mouth every 6 (six) hours as needed for nausea.   Oxycodone HCl 10 MG Tabs Take 1 tablet (10 mg total) by mouth every 4 (four) hours as needed for severe pain (pain score 7-10).   prochlorperazine 10 MG tablet Commonly known as: COMPAZINE Take 10 mg by mouth every 6 (six) hours as needed for nausea or vomiting.   promethazine 25 MG tablet Commonly known as: PHENERGAN Take 25 mg by mouth every 8 (eight) hours as needed for nausea/vomiting.   senna 8.6 MG Tabs tablet Commonly known as: SENOKOT Take 2 tablets (17.2 mg total) by mouth at bedtime.   sertraline 100 MG tablet Commonly known as: ZOLOFT Take 200 mg by mouth daily.   tiZANidine 4 MG tablet Commonly known as: ZANAFLEX Take 4 mg by mouth at bedtime as needed for muscle spasms.   trazodone 300 MG tablet Commonly known as: DESYREL Take 300 mg by mouth at bedtime.   Vitamin D (Ergocalciferol) 1.25 MG (50000 UNIT) Caps capsule Commonly known as: DRISDOL Take 50,000 Units by mouth once a week.   zolpidem 12.5 MG CR tablet Commonly known as: AMBIEN CR Take 6.25 mg by mouth at bedtime as needed for sleep.        Diagnostic Studies: DG Knee Left Port  Result Date: 07/05/2020 CLINICAL DATA:  Total left knee replacement. EXAM: PORTABLE LEFT KNEE - 1-2 VIEW COMPARISON:  CT 04/04/2020. FINDINGS: Total left knee replacement.  Hardware intact.  Anatomic alignment. IMPRESSION: Total left knee replacement with anatomic alignment. Electronically Signed   By: Marcello Moores  Register   On: 07/05/2020 13:26     Disposition: Discharge disposition: 01-Home or Self Care          Follow-up Information     Damiano Stamper, Aaron Edelman, MD. Schedule an appointment as soon as possible for a visit in 2 weeks.   Specialty: Orthopedic Surgery Why: For wound re-check Contact information: [address] [place] [address] [phone]                  Signed: Hilton Cork Kade Demicco 07/10/2020, 1:24 PM

## 2020-07-06 NOTE — Discharge Instructions (Signed)
°Dr. Brian Swinteck °Total Joint Specialist °[facility] °[address] °[place], [place] [address] °[phone] ° °TOTAL KNEE REPLACEMENT POSTOPERATIVE DIRECTIONS ° ° ° °Knee Rehabilitation, Guidelines Following Surgery  °Results after knee surgery are often greatly improved when you follow the exercise, range of motion and muscle strengthening exercises prescribed by your doctor. Safety measures are also important to protect the knee from further injury. Any time any of these exercises cause you to have increased pain or swelling in your knee joint, decrease the amount until you are comfortable again and slowly increase them. If you have problems or questions, call your caregiver or physical therapist for advice.  ° °WEIGHT BEARING °Weight bearing as tolerated with assist device (walker, cane, etc) as directed, use it as long as suggested by your surgeon or therapist, typically at least 4-6 weeks. ° °HOME CARE INSTRUCTIONS  °Remove items at home which could result in a fall. This includes throw rugs or furniture in walking pathways.  °Continue medications as instructed at time of discharge. °You may have some home medications which will be placed on hold until you complete the course of blood thinner medication.  °You may start showering once you are discharged home but do not submerge the incision under water. Just pat the incision dry and apply a dry gauze dressing on daily. °Walk with walker as instructed.  °You may resume a sexual relationship in one month or when given the OK by your doctor.  °· Use walker as long as suggested by your caregivers. °· Avoid periods of inactivity such as sitting longer than an hour when not asleep. This helps prevent blood clots.  °You may put full weight on your legs and walk as much as is comfortable.  °You may return to work once you are cleared by your doctor.  °Do not drive a car for 6 weeks or until released by you surgeon.  °· Do not drive while  taking narcotics.  °Wear the elastic stockings for three weeks following surgery during the day but you may remove then at night. °Make sure you keep all of your appointments after your operation with all of your doctors and caregivers. You should call the office at the above phone number and make an appointment for approximately two weeks after the date of your surgery. °Do not remove your surgical dressing. The dressing is waterproof; you may take showers in 3 days, but do not take tub baths or submerge the dressing. °Please pick up a stool softener and laxative for home use as long as you are requiring pain medications. °· ICE to the affected knee every three hours for 30 minutes at a time and then as needed for pain and swelling.  Continue to use ice on the knee for pain and swelling from surgery. You may notice swelling that will progress down to the foot and ankle.  This is normal after surgery.  Elevate the leg when you are not up walking on it.   °It is important for you to complete the blood thinner medication as prescribed by your doctor. °· Continue to use the breathing machine which will help keep your temperature down.  It is common for your temperature to cycle up and down following surgery, especially at night when you are not up moving around and exerting yourself.  The breathing machine keeps your lungs expanded and your temperature down. ° °RANGE OF MOTION AND STRENGTHENING EXERCISES  °Rehabilitation of the knee is important following a   knee injury or an operation. After just a few days of immobilization, the muscles of the thigh which control the knee become weakened and shrink (atrophy). Knee exercises are designed to build up the tone and strength of the thigh muscles and to improve knee motion. Often times heat used for twenty to thirty minutes before working out will loosen up your tissues and help with improving the range of motion but do not use heat for the first two weeks following  surgery. These exercises can be done on a training (exercise) mat, on the floor, on a table or on a bed. Use what ever works the best and is most comfortable for you Knee exercises include:  °Leg Lifts - While your knee is still immobilized in a splint or cast, you can do straight leg raises. Lift the leg to 60 degrees, hold for 3 sec, and slowly lower the leg. Repeat 10-20 times 2-3 times daily. Perform this exercise against resistance later as your knee gets better.  °Quad and Hamstring Sets - Tighten up the muscle on the front of the thigh (Quad) and hold for 5-10 sec. Repeat this 10-20 times hourly. Hamstring sets are done by pushing the foot backward against an object and holding for 5-10 sec. Repeat as with quad sets.  °A rehabilitation program following serious knee injuries can speed recovery and prevent re-injury in the future due to weakened muscles. Contact your doctor or a physical therapist for more information on knee rehabilitation.  ° °SKILLED REHAB INSTRUCTIONS: °If the patient is transferred to a skilled rehab facility following release from the hospital, a list of the current medications will be sent to the facility for the patient to continue.  When discharged from the skilled rehab facility, please have the facility set up the patient's Home Health Physical Therapy prior to being released. Also, the skilled facility will be responsible for providing the patient with their medications at time of release from the facility to include their pain medication, the muscle relaxants, and their blood thinner medication. If the patient is still at the rehab facility at time of the two week follow up appointment, the skilled rehab facility will also need to assist the patient in arranging follow up appointment in our office and any transportation needs. ° °MAKE SURE YOU:  °Understand these instructions.  °Will watch your condition.  °Will get help right away if you are not doing well or get worse.   ° ° °Pick up stool softner and laxative for home use following surgery while on pain medications. °Do NOT remove your dressing. You may shower.  °Do not take tub baths or submerge incision under water. °May shower starting three days after surgery. °Please use a clean towel to pat the incision dry following showers. °Continue to use ice for pain and swelling after surgery. °Do not use any lotions or creams on the incision until instructed by your surgeon. °____________ ° °Information on my medicine - ELIQUIS® (apixaban) ° °This medication education was reviewed with me or my healthcare representative as part of my discharge preparation.  °Why was Eliquis® prescribed for you? °Eliquis® was prescribed for you to reduce the risk of blood clots forming after orthopedic surgery.   ° °What do You need to know about Eliquis®? °Take your Eliquis® TWICE DAILY - one tablet in the morning and one tablet in the evening with or without food.  It would be best to take the dose about the same time each day. ° °If   you have difficulty swallowing the tablet whole please discuss with your pharmacist how to take the medication safely. ° °Take Eliquis® exactly as prescribed by your doctor and DO NOT stop taking Eliquis® without talking to the doctor who prescribed the medication.  Stopping without other medication to take the place of Eliquis® may increase your risk of developing a clot. ° °After discharge, you should have regular check-up appointments with your healthcare provider that is prescribing your Eliquis®. ° °What do you do if you miss a dose? °If a dose of ELIQUIS® is not taken at the scheduled time, take it as soon as possible on the same day and twice-daily administration should be resumed.  The dose should not be doubled to make up for a missed dose.  Do not take more than one tablet of ELIQUIS at the same time. ° °Important Safety Information °A possible side effect of Eliquis® is bleeding. You should call your  healthcare provider right away if you experience any of the following: °? Bleeding from an injury or your nose that does not stop. °? Unusual colored urine (red or dark brown) or unusual colored stools (red or black). °? Unusual bruising for unknown reasons. °? A serious fall or if you hit your head (even if there is no bleeding). ° °Some medicines may interact with Eliquis® and might increase your risk of bleeding or clotting while on Eliquis®. To help avoid this, consult your healthcare provider or pharmacist prior to using any new prescription or non-prescription medications, including herbals, vitamins, non-steroidal anti-inflammatory drugs (NSAIDs) and supplements. ° °This website has more information on Eliquis® (apixaban): http://www.eliquis.com/eliquis/home ° ° °

## 2020-07-06 NOTE — TOC Transition Note (Signed)
Transition of Care [facility]) - CM/SW Discharge Note   Patient Details  Name: KEIONA JENISON MRN: [id number] Date of Birth: 1961/01/07  Transition of Care [facility]) CM/SW Contact:  Lennart Pall, LCSW Phone Number: 07/06/2020, 1:24 PM   Clinical Narrative:     Met briefly with pt to review DME and f/u needs.  Youth rw needed (see below) and plan/ appointment already set up for OPPT in [facility].  No further TOC needs.  Final next level of care: OP Rehab Barriers to Discharge: No Barriers Identified   Patient Goals and CMS Choice Patient states their goals for this hospitalization and ongoing recovery are:: go home CMS Medicare.gov Compare Post Acute Care list provided to:: Patient    Discharge Placement                       Discharge Plan and Services                DME Arranged: Gilford Rile youth DME Agency: [employer] Date DME Agency Contacted: 07/06/20 Time DME Agency Contacted: 0930 Representative spoke with at DME Agency: Ovid Curd HH Arranged: NA [place] Agency: NA        Social Determinants of Health ([place]) Interventions     Readmission Risk Interventions No flowsheet data found.

## 2020-07-06 NOTE — Progress Notes (Signed)
Physical Therapy Treatment Patient Details Name: Traci Bird MRN: [id number] DOB: 1961/09/09 Today's Date: 07/06/2020    History of Present Illness Patient is 59 y.o. female s/p Lt TKR on 07/05/20 with PMH significant for GERD, DM, OA, anxiety, depression, Lt TKA In 2016, gastric bypass in 2008.    PT Comments    POD # 1 am session Pt was only ablr to amb 18 feet due to nausea.  BP improved Supine        BP 97/59   EOB           BP 107/62 Standing     BP 99/64 3 min          BP 106/66 (nausea started) Pt admits she did NOT eat breakfast and "took a bunch of pills" this morning.     Follow Up Recommendations  Outpatient PT (set up at [place])     Equipment Recommendations  Rolling walker with 5" wheels (youth)    Recommendations for Other Services       Precautions / Restrictions Precautions Precautions: Fall Restrictions Weight Bearing Restrictions: No LLE Weight Bearing: Weight bearing as tolerated    Mobility  Bed Mobility Overal bed mobility: Needs Assistance Bed Mobility: Supine to Sit     Supine to sit: Min assist     General bed mobility comments: 25% VC's for use of bed rail and assist to scoot to EOB.  Demonstarted and instructed how to use a belt to self assist LE off bed.  Transfers   Equipment used: Conservation officer, nature (2 wheeled) Transfers: Sit to/from [employer] to Stand: Min guard;Min assist Stand pivot transfers: Min assist       General transfer comment: 25% VC's for safe technique with RW, assist to initiate and complete power up.  Ambulation/Gait Ambulation/Gait assistance: Min assist Gait Distance (Feet): 18 Feet Assistive device: Rolling walker (2 wheeled) Gait Pattern/deviations: Step-to pattern;Decreased stride length;Decreased stance time - left;Decreased weight shift to left Gait velocity: decr   General Gait Details: 25% VC's on proper walker to self distance and upright posture.  Amb distance limited  by nausea.   Stairs             Wheelchair Mobility    Modified Rankin (Stroke Patients Only)       Balance                                            Cognition Arousal/Alertness: Awake/alert Behavior During Therapy: WFL for tasks assessed/performed Overall Cognitive Status: Within Functional Limits for tasks assessed                                 General Comments: AxO x 4 very pleasant      Exercises      General Comments        Pertinent Vitals/Pain Pain Score: 8  Pain Location: Lt knee Pain Descriptors / Indicators: Aching;Discomfort;Tender;Tightness Pain Intervention(s): Monitored during session;Premedicated before session;Repositioned;Ice applied    Home Living                      Prior Function            PT Goals (current goals can now be found in the care plan section) Progress towards PT goals:  Progressing toward goals    Frequency    7X/week      PT Plan Current plan remains appropriate    Co-evaluation              AM-PAC PT "6 Clicks" Mobility   Outcome Measure  Help needed turning from your back to your side while in a flat bed without using bedrails?: A Little Help needed moving from lying on your back to sitting on the side of a flat bed without using bedrails?: A Little Help needed moving to and from a bed to a chair (including a wheelchair)?: A Little Help needed standing up from a chair using your arms (e.g., wheelchair or bedside chair)?: A Little Help needed to walk in hospital room?: A Little Help needed climbing 3-5 steps with a railing? : A Lot 6 Click Score: 17    End of Session Equipment Utilized During Treatment: Gait belt Activity Tolerance: Other (comment) (nausea) Patient left: in chair;with call bell/phone within reach;with chair alarm set;with family/visitor present Nurse Communication: Mobility status;Other (comment) (nausea) PT Visit Diagnosis: Muscle  weakness (generalized) (M62.81);Difficulty in walking, not elsewhere classified (R26.2)     Time: 1062-6948 PT Time Calculation (min) (ACUTE ONLY): 32 min  Charges:  $Gait Training: 8-22 mins $Therapeutic Activity: 8-22 mins                     Rica Koyanagi  PTA Acute  Rehabilitation Services Pager      [phone] Office      [phone]

## 2020-07-06 NOTE — Progress Notes (Signed)
Physical Therapy Treatment Patient Details Name: Traci Bird MRN: [id number] DOB: 08-10-1961 Today's Date: 07/06/2020    History of Present Illness Patient is 59 y.o. female s/p Lt TKR on 07/05/20 with PMH significant for GERD, DM, OA, anxiety, depression, Lt TKA In 2016, gastric bypass in 2008.    PT Comments    POD # 1 pm session Assisted to bathroom then amb in hallway an increased distance.  "slight" nausea this afternoon but much improved.  Then returned to room to perform some TE's following HEP handout.  Instructed on proper tech, freq as well as use of ICE.   Pt plans to D/C to home tomorrow with spouse   Follow Up Recommendations  Outpatient PT (set up at [place])     Equipment Recommendations  Rolling walker with 5" wheels (youth)    Recommendations for Other Services       Precautions / Restrictions Precautions Precautions: Fall Restrictions Weight Bearing Restrictions: No LLE Weight Bearing: Weight bearing as tolerated    Mobility  Bed Mobility Overal bed mobility: Needs Assistance Bed Mobility: Sit to Supine     Supine to sit: Min assist Sit to supine: Min assist   General bed mobility comments: had pt use gait belt strap to self assist LE up onto bed  Transfers   Equipment used: Rolling walker (2 wheeled) Transfers: Sit to/from [employer] Sit to Stand: Min guard;Min assist Stand pivot transfers: Min assist       General transfer comment: 25% VC's for safe technique with RW, assist to initiate and complete power up.  Ambulation/Gait Ambulation/Gait assistance: Min assist Gait Distance (Feet): 35 Feet Assistive device: Rolling walker (2 wheeled) Gait Pattern/deviations: Step-to pattern;Decreased stride length;Decreased stance time - left;Decreased weight shift to left Gait velocity: decr   General Gait Details: 25% VC's on proper walker to self distance and upright posture.  Tolerated an increased distance with  "slight" nausea (improved)   Stairs             Wheelchair Mobility    Modified Rankin (Stroke Patients Only)       Balance                                            Cognition Arousal/Alertness: Awake/alert Behavior During Therapy: WFL for tasks assessed/performed Overall Cognitive Status: Within Functional Limits for tasks assessed                                 General Comments: AxO x 4 very pleasant      Exercises   Total Knee Replacement TE's following HEP handout 10 reps B LE ankle pumps 05 reps towel squeezes 05 reps knee presses 05 reps heel slides  05 reps SAQ's 05 reps SLR's 05 reps ABD Educated on use of gait belt to assist with TE's Followed by ICE     General Comments        Pertinent Vitals/Pain Pain Score: 8  Pain Location: Lt knee Pain Descriptors / Indicators: Aching;Discomfort;Tender;Tightness Pain Intervention(s): Monitored during session;Premedicated before session;Repositioned;Ice applied    Home Living                      Prior Function            PT Goals (  current goals can now be found in the care plan section) Progress towards PT goals: Progressing toward goals    Frequency    7X/week      PT Plan Current plan remains appropriate    Co-evaluation              AM-PAC PT "6 Clicks" Mobility   Outcome Measure  Help needed turning from your back to your side while in a flat bed without using bedrails?: A Little Help needed moving from lying on your back to sitting on the side of a flat bed without using bedrails?: A Little Help needed moving to and from a bed to a chair (including a wheelchair)?: A Little Help needed standing up from a chair using your arms (e.g., wheelchair or bedside chair)?: A Little Help needed to walk in hospital room?: A Little Help needed climbing 3-5 steps with a railing? : A Lot 6 Click Score: 17    End of Session Equipment Utilized  During Treatment: Gait belt Activity Tolerance: Other (comment) (nausea) Patient left: in bed Nurse Communication: Mobility status;Other (comment) (nausea) PT Visit Diagnosis: Muscle weakness (generalized) (M62.81);Difficulty in walking, not elsewhere classified (R26.2)     Time: 9741-6384 PT Time Calculation (min) (ACUTE ONLY): 28 min  Charges:  $Gait Training: 8-22 mins $Therapeutic Exercise: 8-22 mins                     Rica Koyanagi  PTA Acute  Rehabilitation Services Pager      [phone] Office      [phone]

## 2020-07-07 LAB — CBC
HCT: 29 % — ABNORMAL LOW (ref 36.0–46.0)
Hemoglobin: 9 g/dL — ABNORMAL LOW (ref 12.0–15.0)
MCH: 28.5 pg (ref 26.0–34.0)
MCHC: 31 g/dL (ref 30.0–36.0)
MCV: 91.8 fL (ref 80.0–100.0)
Platelets: 121 10*3/uL — ABNORMAL LOW (ref 150–400)
RBC: 3.16 MIL/uL — ABNORMAL LOW (ref 3.87–5.11)
RDW: 12.9 % (ref 11.5–15.5)
WBC: 7.9 10*3/uL (ref 4.0–10.5)
nRBC: 0 % (ref 0.0–0.2)

## 2020-07-07 NOTE — Plan of Care (Signed)
resolved 

## 2020-07-07 NOTE — Progress Notes (Signed)
Subjective: 2 Days Post-Op Procedure(s) (LRB): LEFT TOTAL KNEE REVISION ARTHROPLASTY (Left) Patient reports pain as mild.   Patient seen in rounds for Dr. Lyla Glassing. Patient is well, and has had no acute complaints or problems other than mild discomfort in the left knee. No acute events overnight. Patient is resting in bed with husband at the bedside this AM. Traci Bird states Traci Bird is ready to go home today. Traci Bird ambulated 35 feet with PT yesterday.  Plan is to go Home after hospital stay.  Objective: Vital signs in last 24 hours: Temp:  [98.3 F (36.8 C)-99.3 F (37.4 C)] 99 F (37.2 C) (07/31 0506) Pulse Rate:  [72-75] 74 (07/31 0506) Resp:  [16-18] 18 (07/31 0506) BP: (106-110)/(62-73) 106/67 (07/31 0506) SpO2:  [90 %-98 %] 94 % (07/31 0506)  Intake/Output from previous day:  Intake/Output Summary (Last 24 hours) at 07/07/2020 1024 Last data filed at 07/07/2020 0900 Gross per 24 hour  Intake 2031.27 ml  Output 800 ml  Net 1231.27 ml    Intake/Output this shift: Total I/O In: 240 [P.O.:240] Out: 300 [Urine:300]  Labs: Recent Labs    07/06/20 0251 07/07/20 0255  HGB 9.2* 9.0*   Recent Labs    07/06/20 0251 07/07/20 0255  WBC 7.4 7.9  RBC 3.33* 3.16*  HCT 30.9* 29.0*  PLT 123* 121*   Recent Labs    07/06/20 0251  NA 139  K 4.6  CL 110  CO2 26  BUN 12  CREATININE 0.67  GLUCOSE 131*  CALCIUM 8.2*   No results for input(s): LABPT, INR in the last 72 hours.  Exam: General - Patient is Alert and Oriented Extremity - Neurologically intact Sensation intact distally Intact pulses distally Dorsiflexion/Plantar flexion intact Dressing/Incision - clean, dry, no drainage Motor Function - intact, moving foot and toes well on exam.   Past Medical History:  Diagnosis Date  . Acute meniscal tear of knee LEFT  . Anxiety   . Arthritis HIPS , KNEES, HANDS  . Chondromalacia of left knee    back  . Depression   . Diabetes mellitus without complication ([place])    No  meds since gastric bypass surgery  . Family history of adverse reaction to anesthesia    Daughter PONV  . GERD (gastroesophageal reflux disease)   . History of diabetes mellitus NO ISSUE SINCE GASTRIC BYPASS  . History of sleep apnea NO ISSUES SINCE GASTRIC BYPASS  . Hypothyroidism    Partial removed benign tumor  . Neuromuscular disorder (HCC)    sciatica  . PONV (postoperative nausea and vomiting)   . Sleep apnea    No longer has after gastric bypass surgery    Assessment/Plan: 2 Days Post-Op Procedure(s) (LRB): LEFT TOTAL KNEE REVISION ARTHROPLASTY (Left) Principal Problem:   Failed total left knee replacement (HCC) Active Problems:   Failed total knee, left (HCC)  Estimated body mass index is 30.36 kg/m as calculated from the following:   Height as of this encounter: 5\' 2"  (1.575 m).   Weight as of this encounter: 75.3 kg. Advance diet Up with therapy D/C IV fluids  DVT Prophylaxis - Eliquis Weight-bearing as tolerated  Hemoglobin stable at 9.0 this AM, down from 13.1 pre-operatively secondary to surgical blood loss. Blood pressures have been low, but Traci Bird reports they have normalized to her baseline at this point. Plan for discharge home today following 1-2 sessions of therapy as long as Traci Bird is meeting her goals and otherwise doing well. Follow up in the office as  previously scheduled. Scheduled for OPPT Monday.   Griffith Citron, PA-C Orthopedic Surgery [phone] 07/07/2020, 10:24 AM

## 2020-07-07 NOTE — Progress Notes (Signed)
Physical Therapy Treatment Patient Details Name: Traci Bird MRN: [id number] DOB: 1961/09/29 Today's Date: 07/07/2020    History of Present Illness Patient is 59 y.o. female s/p Lt TKR on 07/05/20 with PMH significant for GERD, DM, OA, anxiety, depression, Lt TKA In 2016, gastric bypass in 2008.    PT Comments    Pt tolerated session much better today. No nauseous,  Tolerated exercises and ambulation, husband involved in session and assisting patient for exercises, car transfer, HEP and ramp all education complete. REady for DC from our standpoint  Follow Up Recommendations  Outpatient PT     Equipment Recommendations  Rolling walker with 5" wheels    Recommendations for Other Services       Precautions / Restrictions Precautions Precautions: Fall Precaution Comments: 6-8 falls in the last 6 months Restrictions Weight Bearing Restrictions: No LLE Weight Bearing: Weight bearing as tolerated    Mobility  Bed Mobility Overal bed mobility: Modified Independent Bed Mobility: Sit to Supine     Supine to sit: Min guard Sit to supine: Min guard      Transfers Overall transfer level: Needs assistance Equipment used: Rolling walker (2 wheeled) Transfers: Sit to/from Stand Sit to Stand: Min guard         General transfer comment: husband helps assist patietn up from sitting, husband very hands on  Ambulation/Gait Ambulation/Gait assistance: Min guard Gait Distance (Feet): 50 Feet Assistive device: Rolling walker (2 wheeled) Gait Pattern/deviations: Step-to pattern Gait velocity: decr   General Gait Details: step to gait pattern educated to prevnet buckling until strength increases in LLE   Stairs             Wheelchair Mobility    Modified Rankin (Stroke Patients Only)       Balance Overall balance assessment: Needs assistance Sitting-balance support: Feet supported Sitting balance-Leahy Scale: Good     Standing balance support: During  functional activity;Bilateral upper extremity supported Standing balance-Leahy Scale: Good                              Cognition Arousal/Alertness: Awake/alert Behavior During Therapy: WFL for tasks assessed/performed Overall Cognitive Status: Within Functional Limits for tasks assessed                                 General Comments: AxO x 4 very pleasant      Exercises Total Joint Exercises Ankle Circles/Pumps: AROM;Both;Supine Quad Sets: AROM;Left;Supine;10 reps Short Arc QuadSinclair Ship;Supine;10 reps;Left Heel Slides: AAROM;Supine;Left;10 reps Hip ABduction/ADduction: AAROM;Supine;Left;10 reps Straight Leg Raises: AAROM;Supine;Left;10 reps Long Arc Quad: AAROM;Seated;Left;10 reps Knee Flexion: AAROM;Seated;5 reps;Left Goniometric ROM: 0-90in sitting    General Comments        Pertinent Vitals/Pain Pain Assessment: 0-10 Pain Score: 3  Pain Location: Lt knee Pain Descriptors / Indicators: Aching;Discomfort;Tender;Tightness Pain Intervention(s): Monitored during session;Ice applied    Home Living                      Prior Function            PT Goals (current goals can now be found in the care plan section) Acute Rehab PT Goals PT Goal Formulation: With patient Time For Goal Achievement: 07/12/20 Potential to Achieve Goals: Good Progress towards PT goals: Progressing toward goals    Frequency    7X/week      PT Plan  Current plan remains appropriate    Co-evaluation              AM-PAC PT "6 Clicks" Mobility   Outcome Measure  Help needed turning from your back to your side while in a flat bed without using bedrails?: None Help needed moving from lying on your back to sitting on the side of a flat bed without using bedrails?: A Little Help needed moving to and from a bed to a chair (including a wheelchair)?: A Little Help needed standing up from a chair using your arms (e.g., wheelchair or bedside chair)?: A  Little Help needed to walk in hospital room?: A Little Help needed climbing 3-5 steps with a railing? : A Little 6 Click Score: 19    End of Session Equipment Utilized During Treatment: Gait belt   Patient left: in bed Nurse Communication: Mobility status;Other (comment) (ready for DC) PT Visit Diagnosis: Muscle weakness (generalized) (M62.81);Difficulty in walking, not elsewhere classified (R26.2)     Time: 0940-1010 PT Time Calculation (min) (ACUTE ONLY): 30 min  Charges:  $Gait Training: 8-22 mins $Therapeutic Exercise: 8-22 mins                     [name], PT, MPT Acute Rehabilitation Services Office: [phone] Pager: [phone] 07/07/2020    [name], [name] 07/07/2020, 11:05 AM

## 2020-07-07 NOTE — Plan of Care (Signed)
  Problem: Education: Goal: Knowledge of the prescribed therapeutic regimen will improve Outcome: Progressing   Problem: Activity: Goal: Ability to avoid complications of mobility impairment will improve Outcome: Progressing Goal: Range of joint motion will improve Outcome: Progressing   Problem: Pain Management: Goal: Pain level will decrease with appropriate interventions Outcome: Progressing   

## 2020-07-09 ENCOUNTER — Encounter (HOSPITAL_COMMUNITY): Payer: Self-pay | Admitting: Orthopedic Surgery

## 2020-07-10 LAB — AEROBIC/ANAEROBIC CULTURE W GRAM STAIN (SURGICAL/DEEP WOUND): Culture: NO GROWTH

## 2021-03-19 DIAGNOSIS — Z0271 Encounter for disability determination: Secondary | ICD-10-CM

## 2021-12-28 ENCOUNTER — Inpatient Hospital Stay (HOSPITAL_COMMUNITY)
Admission: EM | Admit: 2021-12-28 | Discharge: 2022-01-02 | DRG: 522 | Disposition: A | Discharge status: Home / Self Care | Payer: 59 | Attending: Internal Medicine | Admitting: Internal Medicine

## 2021-12-28 ENCOUNTER — Encounter (HOSPITAL_COMMUNITY): Payer: Self-pay

## 2021-12-28 ENCOUNTER — Other Ambulatory Visit: Payer: Self-pay

## 2021-12-28 ENCOUNTER — Emergency Department (HOSPITAL_COMMUNITY): Payer: 59

## 2021-12-28 DIAGNOSIS — R52 Pain, unspecified: Secondary | ICD-10-CM

## 2021-12-28 DIAGNOSIS — R338 Other retention of urine: Secondary | ICD-10-CM | POA: Diagnosis present

## 2021-12-28 DIAGNOSIS — G894 Chronic pain syndrome: Secondary | ICD-10-CM | POA: Diagnosis present

## 2021-12-28 DIAGNOSIS — E8809 Other disorders of plasma-protein metabolism, not elsewhere classified: Secondary | ICD-10-CM | POA: Diagnosis present

## 2021-12-28 DIAGNOSIS — Z9884 Bariatric surgery status: Secondary | ICD-10-CM | POA: Diagnosis not present

## 2021-12-28 DIAGNOSIS — W010XXA Fall on same level from slipping, tripping and stumbling without subsequent striking against object, initial encounter: Secondary | ICD-10-CM | POA: Diagnosis present

## 2021-12-28 DIAGNOSIS — Z885 Allergy status to narcotic agent status: Secondary | ICD-10-CM

## 2021-12-28 DIAGNOSIS — S72012A Unspecified intracapsular fracture of left femur, initial encounter for closed fracture: Principal | ICD-10-CM | POA: Diagnosis present

## 2021-12-28 DIAGNOSIS — D62 Acute posthemorrhagic anemia: Secondary | ICD-10-CM | POA: Diagnosis not present

## 2021-12-28 DIAGNOSIS — Z6827 Body mass index (BMI) 27.0-27.9, adult: Secondary | ICD-10-CM

## 2021-12-28 DIAGNOSIS — S72002A Fracture of unspecified part of neck of left femur, initial encounter for closed fracture: Secondary | ICD-10-CM

## 2021-12-28 DIAGNOSIS — F419 Anxiety disorder, unspecified: Secondary | ICD-10-CM | POA: Diagnosis present

## 2021-12-28 DIAGNOSIS — F329 Major depressive disorder, single episode, unspecified: Secondary | ICD-10-CM | POA: Diagnosis present

## 2021-12-28 DIAGNOSIS — R42 Dizziness and giddiness: Secondary | ICD-10-CM | POA: Diagnosis not present

## 2021-12-28 DIAGNOSIS — Z20822 Contact with and (suspected) exposure to covid-19: Secondary | ICD-10-CM | POA: Diagnosis present

## 2021-12-28 DIAGNOSIS — Z22322 Carrier or suspected carrier of Methicillin resistant Staphylococcus aureus: Secondary | ICD-10-CM

## 2021-12-28 DIAGNOSIS — R296 Repeated falls: Secondary | ICD-10-CM | POA: Diagnosis present

## 2021-12-28 DIAGNOSIS — G250 Essential tremor: Secondary | ICD-10-CM | POA: Diagnosis present

## 2021-12-28 DIAGNOSIS — R339 Retention of urine, unspecified: Secondary | ICD-10-CM | POA: Diagnosis not present

## 2021-12-28 DIAGNOSIS — Z79890 Hormone replacement therapy (postmenopausal): Secondary | ICD-10-CM

## 2021-12-28 DIAGNOSIS — Z886 Allergy status to analgesic agent status: Secondary | ICD-10-CM | POA: Diagnosis not present

## 2021-12-28 DIAGNOSIS — Z419 Encounter for procedure for purposes other than remedying health state, unspecified: Secondary | ICD-10-CM

## 2021-12-28 DIAGNOSIS — G8929 Other chronic pain: Secondary | ICD-10-CM | POA: Diagnosis present

## 2021-12-28 DIAGNOSIS — M549 Dorsalgia, unspecified: Secondary | ICD-10-CM | POA: Diagnosis present

## 2021-12-28 DIAGNOSIS — D649 Anemia, unspecified: Secondary | ICD-10-CM | POA: Diagnosis not present

## 2021-12-28 DIAGNOSIS — E440 Moderate protein-calorie malnutrition: Secondary | ICD-10-CM | POA: Diagnosis present

## 2021-12-28 DIAGNOSIS — Z79899 Other long term (current) drug therapy: Secondary | ICD-10-CM

## 2021-12-28 DIAGNOSIS — D638 Anemia in other chronic diseases classified elsewhere: Secondary | ICD-10-CM | POA: Diagnosis present

## 2021-12-28 DIAGNOSIS — Z7901 Long term (current) use of anticoagulants: Secondary | ICD-10-CM

## 2021-12-28 DIAGNOSIS — F32A Depression, unspecified: Secondary | ICD-10-CM | POA: Diagnosis present

## 2021-12-28 DIAGNOSIS — E039 Hypothyroidism, unspecified: Secondary | ICD-10-CM | POA: Diagnosis present

## 2021-12-28 DIAGNOSIS — M1990 Unspecified osteoarthritis, unspecified site: Secondary | ICD-10-CM | POA: Diagnosis present

## 2021-12-28 DIAGNOSIS — Z96652 Presence of left artificial knee joint: Secondary | ICD-10-CM | POA: Diagnosis present

## 2021-12-28 DIAGNOSIS — Z90710 Acquired absence of both cervix and uterus: Secondary | ICD-10-CM

## 2021-12-28 DIAGNOSIS — Z8659 Personal history of other mental and behavioral disorders: Secondary | ICD-10-CM

## 2021-12-28 DIAGNOSIS — Z9079 Acquired absence of other genital organ(s): Secondary | ICD-10-CM

## 2021-12-28 DIAGNOSIS — K219 Gastro-esophageal reflux disease without esophagitis: Secondary | ICD-10-CM | POA: Diagnosis present

## 2021-12-28 DIAGNOSIS — Z90722 Acquired absence of ovaries, bilateral: Secondary | ICD-10-CM

## 2021-12-28 DIAGNOSIS — S72002P Fracture of unspecified part of neck of left femur, subsequent encounter for closed fracture with malunion: Secondary | ICD-10-CM | POA: Diagnosis not present

## 2021-12-28 LAB — RESP PANEL BY RT-PCR (FLU A&B, COVID) ARPGX2
Influenza A by PCR: NEGATIVE
Influenza B by PCR: NEGATIVE
SARS Coronavirus 2 by RT PCR: NEGATIVE

## 2021-12-28 LAB — COMPREHENSIVE METABOLIC PANEL
ALT: 15 U/L (ref 0–44)
AST: 22 U/L (ref 15–41)
Albumin: 3.3 g/dL — ABNORMAL LOW (ref 3.5–5.0)
Alkaline Phosphatase: 77 U/L (ref 38–126)
Anion gap: 8 (ref 5–15)
BUN: 18 mg/dL (ref 8–23)
CO2: 23 mmol/L (ref 22–32)
Calcium: 8.2 mg/dL — ABNORMAL LOW (ref 8.9–10.3)
Chloride: 107 mmol/L (ref 98–111)
Creatinine, Ser: 0.64 mg/dL (ref 0.44–1.00)
GFR, Estimated: >60 mL/min (ref >60)
Glucose, Bld: 89 mg/dL (ref 70–99)
Potassium: 3.7 mmol/L (ref 3.5–5.1)
Sodium: 138 mmol/L (ref 135–145)
Total Bilirubin: 0.4 mg/dL (ref 0.3–1.2)
Total Protein: 6.4 g/dL — ABNORMAL LOW (ref 6.5–8.1)

## 2021-12-28 LAB — CBC WITH DIFFERENTIAL/PLATELET
Abs Immature Granulocytes: 0.04 10*3/uL (ref 0.00–0.07)
Basophils Absolute: 0 10*3/uL (ref 0.0–0.1)
Basophils Relative: 0 %
Eosinophils Absolute: 0.1 10*3/uL (ref 0.0–0.5)
Eosinophils Relative: 1 %
HCT: 35.2 % — ABNORMAL LOW (ref 36.0–46.0)
Hemoglobin: 10.7 g/dL — ABNORMAL LOW (ref 12.0–15.0)
Immature Granulocytes: 0 %
Lymphocytes Relative: 24 %
Lymphs Abs: 2.5 10*3/uL (ref 0.7–4.0)
MCH: 25.8 pg — ABNORMAL LOW (ref 26.0–34.0)
MCHC: 30.4 g/dL (ref 30.0–36.0)
MCV: 84.8 fL (ref 80.0–100.0)
Monocytes Absolute: 0.6 10*3/uL (ref 0.1–1.0)
Monocytes Relative: 6 %
Neutro Abs: 7.3 10*3/uL (ref 1.7–7.7)
Neutrophils Relative %: 69 %
Platelets: 190 10*3/uL (ref 150–400)
RBC: 4.15 MIL/uL (ref 3.87–5.11)
RDW: 14.7 % (ref 11.5–15.5)
WBC: 10.5 10*3/uL (ref 4.0–10.5)
nRBC: 0 % (ref 0.0–0.2)

## 2021-12-28 LAB — URINALYSIS, ROUTINE W REFLEX MICROSCOPIC
Bacteria, UA: NONE SEEN
Bilirubin Urine: NEGATIVE
Glucose, UA: NEGATIVE mg/dL
Hgb urine dipstick: NEGATIVE
Ketones, ur: NEGATIVE mg/dL
Nitrite: NEGATIVE
Protein, ur: NEGATIVE mg/dL
Specific Gravity, Urine: 1.019 (ref 1.005–1.030)
pH: 5 (ref 5.0–8.0)

## 2021-12-28 LAB — CBG MONITORING, ED
Glucose-Capillary: 73 mg/dL (ref 70–99)
Glucose-Capillary: 77 mg/dL (ref 70–99)

## 2021-12-28 MED ORDER — SERTRALINE HCL 100 MG PO TABS
100.0000 mg | ORAL_TABLET | Freq: Every day | ORAL | Status: DC
Start: 2021-12-28 — End: 2022-01-02
  Administered 2021-12-28 – 2022-01-01 (×5): 100 mg via ORAL
  Filled 2021-12-28 (×5): qty 1

## 2021-12-28 MED ORDER — LEVOTHYROXINE SODIUM 75 MCG PO TABS
75.0000 ug | ORAL_TABLET | Freq: Every day | ORAL | Status: DC
  Administered 2021-12-29 – 2022-01-02 (×5): 75 ug via ORAL
  Filled 2021-12-28 (×5): qty 1

## 2021-12-28 MED ORDER — CLONAZEPAM 0.5 MG PO TABS
0.5000 mg | ORAL_TABLET | Freq: Two times a day (BID) | ORAL | Status: DC | PRN
  Administered 2021-12-28 – 2021-12-31 (×3): 0.5 mg via ORAL
  Filled 2021-12-28 (×3): qty 1

## 2021-12-28 MED ORDER — OXYCODONE HCL 5 MG PO TABS
10.0000 mg | ORAL_TABLET | ORAL | Status: DC | PRN
  Administered 2021-12-28 – 2022-01-02 (×19): 10 mg via ORAL
  Filled 2021-12-28 (×19): qty 2

## 2021-12-28 MED ORDER — ONDANSETRON HCL 4 MG/2ML IJ SOLN
4.0000 mg | Freq: Once | INTRAMUSCULAR | Status: AC
  Administered 2021-12-28: 4 mg via INTRAVENOUS
  Filled 2021-12-28: qty 2

## 2021-12-28 MED ORDER — DIPHENHYDRAMINE HCL 50 MG/ML IJ SOLN
25.0000 mg | Freq: Four times a day (QID) | INTRAMUSCULAR | Status: DC | PRN
  Administered 2021-12-29: 25 mg via INTRAVENOUS
  Filled 2021-12-28 (×2): qty 1

## 2021-12-28 MED ORDER — DOCUSATE SODIUM 100 MG PO CAPS
100.0000 mg | ORAL_CAPSULE | Freq: Two times a day (BID) | ORAL | Status: DC
  Administered 2021-12-28: 100 mg via ORAL
  Filled 2021-12-28: qty 1

## 2021-12-28 MED ORDER — TIZANIDINE HCL 4 MG PO TABS
4.0000 mg | ORAL_TABLET | Freq: Every evening | ORAL | Status: DC | PRN
  Administered 2021-12-28: 4 mg via ORAL
  Filled 2021-12-28: qty 1

## 2021-12-28 MED ORDER — HYDROMORPHONE HCL 1 MG/ML IJ SOLN
1.0000 mg | INTRAMUSCULAR | Status: DC | PRN
  Administered 2021-12-28 – 2021-12-29 (×5): 1 mg via INTRAVENOUS
  Filled 2021-12-28 (×5): qty 1

## 2021-12-28 MED ORDER — PROCHLORPERAZINE EDISYLATE 10 MG/2ML IJ SOLN
10.0000 mg | Freq: Four times a day (QID) | INTRAMUSCULAR | Status: DC | PRN
  Administered 2021-12-29: 08:00:00 10 mg via INTRAVENOUS
  Filled 2021-12-28: qty 2

## 2021-12-28 MED ORDER — FENTANYL CITRATE PF 50 MCG/ML IJ SOSY
100.0000 ug | PREFILLED_SYRINGE | INTRAMUSCULAR | Status: DC | PRN
  Administered 2021-12-28 (×2): 100 ug via INTRAVENOUS
  Filled 2021-12-28 (×2): qty 2

## 2021-12-28 MED ORDER — BUSPIRONE HCL 5 MG PO TABS: 15.0000 mg | ORAL_TABLET | Freq: Every day | ORAL | Status: DC | PRN

## 2021-12-28 MED ORDER — GABAPENTIN 300 MG PO CAPS: 600.0000 mg | ORAL_CAPSULE | Freq: Every day | ORAL | Status: DC

## 2021-12-28 MED ORDER — FENTANYL CITRATE PF 50 MCG/ML IJ SOSY
50.0000 ug | PREFILLED_SYRINGE | Freq: Once | INTRAMUSCULAR | Status: AC
  Administered 2021-12-28: 50 ug via INTRAVENOUS
  Filled 2021-12-28: qty 1

## 2021-12-28 MED ORDER — ENOXAPARIN SODIUM 40 MG/0.4ML IJ SOSY
40.0000 mg | PREFILLED_SYRINGE | INTRAMUSCULAR | Status: DC
  Administered 2021-12-28 – 2021-12-29 (×2): 40 mg via SUBCUTANEOUS
  Filled 2021-12-28 (×2): qty 0.4

## 2021-12-28 MED ORDER — ACETAMINOPHEN 325 MG PO TABS
650.0000 mg | ORAL_TABLET | Freq: Four times a day (QID) | ORAL | Status: DC | PRN
  Administered 2021-12-28 – 2021-12-29 (×2): 650 mg via ORAL
  Filled 2021-12-28 (×2): qty 2

## 2021-12-28 NOTE — H&P (Signed)
History and Physical    Traci Bird OQH:[id number] DOB: Nov 08, 1961 DOA: 12/28/2021  PCP: [name], NP  Patient coming from: Home  Chief Complaint: fall  HPI: Traci Bird is a 61 y.o. female with medical history significant of chronic pain, anxiety/depression, hypothyroidism. She was in her usual state of health until early this morning. Around 4am she got up to get something to drink. After getting it, she lost her balance and fell to her left side. She didn't hit her head. There was no LOC. She was unable to get up. Family called for EMS to bring her to the ED for evaluation. Of note, she reports several recent falls. She denies any other aggravating or alleviating factors.   ED Course: XR noted left femoral neck fracture. Ortho consulted. [facility] called for admission.   Review of Systems:  Denies CP, palpitations, abdominal pain, dyspnea, lightheaded, dizziness, N/V/D, fevers, syncopal episodes. Review of systems is otherwise negative for all not mentioned in HPI.   PMHx Past Medical History:  Diagnosis Date   Acute meniscal tear of knee LEFT   Anxiety    Arthritis HIPS , KNEES, HANDS   Chondromalacia of left knee    back   Depression    Diabetes mellitus without complication (HCC)    No meds since gastric bypass surgery   Family history of adverse reaction to anesthesia    Daughter PONV   GERD (gastroesophageal reflux disease)    History of diabetes mellitus NO ISSUE SINCE GASTRIC BYPASS   History of sleep apnea NO ISSUES SINCE GASTRIC BYPASS   Hypothyroidism    Partial removed benign tumor   Neuromuscular disorder (HCC)    sciatica   PONV (postoperative nausea and vomiting)    Sleep apnea    No longer has after gastric bypass surgery    PSHx Past Surgical History:  Procedure Laterality Date   ABDOMINAL HYSTERECTOMY  11-06-2005  DR HENLEY   W/ BILATERAL SALPINGO-OOPHORECTOMY   bilateral breast reduction  1992   GASTRIC BYPASS  2008   HYSTEROSCOPY WITH D  & C  05-16-2005  DR HENLEY   POLYPECTOMY   JOINT REPLACEMENT Left March 22, 2015   Knee   KNEE ARTHROSCOPY  10/29/2012   Procedure: ARTHROSCOPY KNEE;  Surgeon: Johnn Hai, MD;  Location: [facility];  Service: Orthopedics;  Laterality: Left;  WITH DEBRIDEMENT   KNEE ARTHROSCOPY WITH LATERAL RELEASE  10/29/2012   Procedure: KNEE ARTHROSCOPY WITH LATERAL RELEASE;  Surgeon: Johnn Hai, MD;  Location: [facility];  Service: Orthopedics;  Laterality: Left;   NASAL SINUS SURGERY     NEUROPLASTY / TRANSPOSITION MEDIAN NERVE AT CARPAL TUNNEL BILATERAL  2002   partial thyoidectomy  1994   PERIPHERAL VASCULAR CATHETERIZATION N/A 10/12/2015   Procedure: Upper Extremity Angiography;  Surgeon: Elam Dutch, MD;  Location: [facility];  Service: Cardiovascular;  Laterality: N/A;   SEPTOPLASTY  1987   tendon repair heel  2011   left   TONSILLECTOMY AND ADENOIDECTOMY  1966   TOTAL KNEE ARTHROPLASTY Left 03/22/2015   Procedure: LEFT TOTAL KNEE ARTHROPLASTY;  Surgeon: Susa Day, MD;  Location: [facility];  Service: Orthopedics;  Laterality: Left;   TOTAL KNEE REVISION Left 07/05/2020   Procedure: LEFT TOTAL KNEE REVISION ARTHROPLASTY;  Surgeon: Rod Can, MD;  Location: [facility];  Service: Orthopedics;  Laterality: Left;   TUBAL LIGATION  1984    SocHx  reports that she has never smoked.  She has never used smokeless tobacco. She reports that she does not drink alcohol and does not use drugs.  Allergies  Allergen Reactions   Nsaids Other (See Comments)    Gastric bypass surgery   Morphine And Related Itching    Pump     FamHx Family History  Problem Relation Age of Onset   Diabetes Mother    Hyperlipidemia Mother    Hypertension Mother    Diabetes Father    Hyperlipidemia Father    Heart disease Father        CAD- Open Heart   Colon cancer Neg Hx    Stomach cancer Neg Hx     Prior to Admission medications   Medication Sig Start Date  End Date Taking? Authorizing Provider  busPIRone (BUSPAR) 15 MG tablet Take 15 mg by mouth daily as needed (anxiety).    Yes [provider]  clonazePAM (KLONOPIN) 0.5 MG tablet Take 0.5 mg by mouth 2 (two) times daily as needed for anxiety.  03/26/20  Yes [provider]  gabapentin (NEURONTIN) 300 MG capsule Take 300 mg by mouth 2 (two) times daily as needed for pain. 12/14/21  Yes [provider]  gabapentin (NEURONTIN) 600 MG tablet Take 600 mg by mouth at bedtime.   Yes [provider]  levothyroxine (SYNTHROID, LEVOTHROID) 75 MCG tablet Take 75 mcg by mouth at bedtime.  09/09/15  Yes [provider]  oxyCODONE 10 MG TABS Take 1 tablet (10 mg total) by mouth every 4 (four) hours as needed for severe pain (pain score 7-10). 07/06/20  Yes Swinteck, Aaron Edelman, MD  oxyCODONE-acetaminophen (PERCOCET) 10-325 MG tablet Take 1 tablet by mouth daily as needed for pain. 09/11/15  Yes [provider]  promethazine (PHENERGAN) 25 MG tablet Take 25 mg by mouth every 8 (eight) hours as needed for nausea/vomiting. 05/21/20  Yes [provider]  sertraline (ZOLOFT) 100 MG tablet Take 100 mg by mouth at bedtime. 03/01/20  Yes [provider]  tiZANidine (ZANAFLEX) 4 MG tablet Take 4 mg by mouth at bedtime as needed for muscle spasms.    Yes [provider]  Vitamin D, Ergocalciferol, (DRISDOL) 1.25 MG (50000 UNIT) CAPS capsule Take 50,000 Units by mouth once a week. 03/27/20  Yes [provider]  zolpidem (AMBIEN) 10 MG tablet Take 10 mg by mouth at bedtime as needed for sleep.   Yes [provider]  apixaban (ELIQUIS) 2.5 MG TABS tablet Take 1 tablet (2.5 mg total) by mouth every 12 (twelve) hours. Patient not taking: Reported on 12/28/2021 07/06/20   Rod Can, MD  docusate sodium (COLACE) 100 MG capsule Take 1 capsule (100 mg total) by mouth 2 (two) times daily. Patient not taking: Reported on 12/28/2021 07/06/20    Rod Can, MD  ondansetron (ZOFRAN) 4 MG tablet Take 1 tablet (4 mg total) by mouth every 6 (six) hours as needed for nausea. Patient not taking: Reported on 12/28/2021 07/06/20   Rod Can, MD  senna (SENOKOT) 8.6 MG TABS tablet Take 2 tablets (17.2 mg total) by mouth at bedtime. Patient not taking: Reported on 12/28/2021 07/06/20   Rod Can, MD    Physical Exam: Vitals:   12/28/21 0631 12/28/21 0904 12/28/21 0941 12/28/21 1000  BP:  (!) 129/102 (!) 134/96 135/62  Pulse:  76 77 81  Resp:  16 16 16   Temp:      TempSrc:      SpO2:  100% 97% 96%  Weight: 78.7 kg  Height: 5\' 7"  (1.702 m)       General: 61 y.o. female resting in bed in NAD Eyes: PERRL, normal sclera ENMT: Nares patent w/o discharge, orophaynx clear, dentition normal, ears w/o discharge/lesions/ulcers Neck: Supple, trachea midline Cardiovascular: RRR, +S1, S2, no m/g/r, equal pulses throughout Respiratory: CTABL, no w/r/r, normal WOB GI: BS+, NDNT, no masses noted, no organomegaly noted MSK: No e/c/c, left hip limited ROM d/t pain Neuro: A&O x 3, no focal deficits Psyc: Odd affect, anxious but cooperative  Labs on Admission: I have personally reviewed following labs and imaging studies  CBC: Recent Labs  Lab 12/28/21 0831  WBC 10.5  NEUTROABS 7.3  HGB 10.7*  HCT 35.2*  MCV 84.8  PLT 299   Basic Metabolic Panel: Recent Labs  Lab 12/28/21 0831  NA 138  K 3.7  CL 107  CO2 23  GLUCOSE 89  BUN 18  CREATININE 0.64  CALCIUM 8.2*   GFR: Estimated Creatinine Clearance: 78.7 mL/min (by C-G formula based on SCr of 0.64 mg/dL). Liver Function Tests: Recent Labs  Lab 12/28/21 0831  AST 22  ALT 15  ALKPHOS 77  BILITOT 0.4  PROT 6.4*  ALBUMIN 3.3*   No results for input(s): LIPASE, AMYLASE in the last 168 hours. No results for input(s): AMMONIA in the last 168 hours. Coagulation Profile: No results for input(s): INR, PROTIME in the last 168 hours. Cardiac Enzymes: No results  for input(s): CKTOTAL, CKMB, CKMBINDEX, TROPONINI in the last 168 hours. BNP (last 3 results) No results for input(s): PROBNP in the last 8760 hours. HbA1C: No results for input(s): HGBA1C in the last 72 hours. CBG: Recent Labs  Lab 12/28/21 0654 12/28/21 0657  GLUCAP 73 77   Lipid Profile: No results for input(s): CHOL, HDL, LDLCALC, TRIG, CHOLHDL, LDLDIRECT in the last 72 hours. Thyroid Function Tests: No results for input(s): TSH, T4TOTAL, FREET4, T3FREE, THYROIDAB in the last 72 hours. Anemia Panel: No results for input(s): VITAMINB12, FOLATE, FERRITIN, TIBC, IRON, RETICCTPCT in the last 72 hours. Urine analysis:    Component Value Date/Time   COLORURINE YELLOW 12/28/2021 0831   APPEARANCEUR CLEAR 12/28/2021 0831   LABSPEC 1.019 12/28/2021 0831   PHURINE 5.0 12/28/2021 0831   GLUCOSEU NEGATIVE 12/28/2021 0831   HGBUR NEGATIVE 12/28/2021 0831   BILIRUBINUR NEGATIVE 12/28/2021 0831   KETONESUR NEGATIVE 12/28/2021 0831   PROTEINUR NEGATIVE 12/28/2021 0831   UROBILINOGEN 1.0 03/14/2015 0915   NITRITE NEGATIVE 12/28/2021 0831   LEUKOCYTESUR MODERATE (A) 12/28/2021 0831    Radiological Exams on Admission: DG Lumbar Spine Complete  Result Date: 12/28/2021 CLINICAL DATA:  61 year old female status post fall stone at home. EXAM: LUMBAR SPINE - COMPLETE 4+ VIEW COMPARISON:  Lumbar radiographs 07/04/2017, CT lumbar myelogram 09/30/2013. FINDINGS: Osteopenia. Normal lumbar segmentation. Stable lumbar lordosis and disc spaces. Lumbar vertebrae appear intact. Visible lower thoracic levels appear grossly intact. Chronic bilateral L5-S1 facet arthropathy. Increased density in the pelvis is presumed to be bladder distension. Negative visible bowel gas. IMPRESSION: 1. No acute osseous abnormality identified in the lumbar spine. 2. Query distended urinary bladder. Electronically Signed   By: Genevie Ann M.D.   On: 12/28/2021 08:44   DG HIP UNILAT WITH PELVIS 2-3 VIEWS LEFT  Result Date:  12/28/2021 CLINICAL DATA:  61 year old female status post fall at home. EXAM: DG HIP (WITH OR WITHOUT PELVIS) 2-3V LEFT COMPARISON:  None available. FINDINGS: Moderately impacted left femoral neck fracture, with mild lateral displacement. Femoral head remains normally located. Left intertrochanteric segment appears  intact. No superimposed acute pelvic fracture identified. Grossly intact proximal right femur. Pelvic phleboliths. Negative visible bowel gas. IMPRESSION: Moderately impacted acute left femoral neck fracture. Electronically Signed   By: Genevie Ann M.D.   On: 12/28/2021 08:41    EKG: None obtained in ED  Assessment/Plan Left femoral neck fracture     - admit to inpt, tele     - ortho consulted; surgery Monday?     - Pain control is going to be complex; she is on a lot of chronic meds, we will not achieve full control, she has been counseled on this, will resume her home regimen and add PRN dilluadid     - PT/OT after procedure; TOC consult  Chronic pain     - see above  Hypothyroidism     - resume synthroid  Normocytic anemia     - no evidence of bleed, check iron levels, follow  Anxiety/Depression     - resume home regimen  DVT prophylaxis: lovenox  Code Status: FULL  Family Communication: w/ husband at bedside  Consults called: EDPA spoke with ortho (Dr Onnie Graham)  Status is: Inpatient  Remains inpatient appropriate because: severity of surgical problem  Jonnie Finner DO [facility]  If 7PM-7AM, please contact night-coverage [email or web address]  12/28/2021, 10:46 AM

## 2021-12-28 NOTE — ED Provider Notes (Signed)
[facility] DEPT Provider Note   CSN: [id number] Arrival date & time: 12/28/21  3500     History Chief Complaint  Patient presents with   Leg Injury    Traci Bird is a 61 y.o. female with evaluation of left hip pain after a fall today.  Patient reports that she has a history of balance issues and sees neurology who is not able to find a cause of this.  She reports she tends to lose her balance worse at night and is supposed be walking with a cane.  However, earlier this morning/last night she was walking to the kitchen in the dark without a cane and lost balance when opening the fridge..  She denies any loss of consciousness or syncope.  She denies any lightheadedness or dizziness.  She reports she landed on her left hip but denies hitting her head or neck.  She denies any head or neck pain or any blood thinner use.  She reports she was not able to walk on the hip.  Denies any numbness or tingling in the leg.  Denies any chest pain or shortness of breath.  She reports a medical history of an essential tremor and sciatica and mentions that her blood pressures have been lower after her recent left knee replacement.  She is allergic to morphine and NSAIDs.  Daily medications include gabapentin, oxycodone 10, vitamin D, primidone, tizanidine, and Ambien.  She was brought in via EMS and was given 100 mcg of fentanyl and 30 of ketamine.  HPI     Home Medications Prior to Admission medications   Medication Sig Start Date End Date Taking? Authorizing Provider  apixaban (ELIQUIS) 2.5 MG TABS tablet Take 1 tablet (2.5 mg total) by mouth every 12 (twelve) hours. 07/06/20   Swinteck, Aaron Edelman, MD  busPIRone (BUSPAR) 15 MG tablet Take 15 mg by mouth daily as needed (anxiety).     [provider]  clonazePAM (KLONOPIN) 0.5 MG tablet Take 0.5 mg by mouth 2 (two) times daily as needed for anxiety.  03/26/20   [provider]  docusate sodium (COLACE) 100  MG capsule Take 1 capsule (100 mg total) by mouth 2 (two) times daily. 07/06/20   Swinteck, Aaron Edelman, MD  levothyroxine (SYNTHROID, LEVOTHROID) 75 MCG tablet Take 75 mcg by mouth at bedtime.  09/09/15   [provider]  ondansetron (ZOFRAN) 4 MG tablet Take 1 tablet (4 mg total) by mouth every 6 (six) hours as needed for nausea. 07/06/20   Swinteck, Aaron Edelman, MD  oxyCODONE 10 MG TABS Take 1 tablet (10 mg total) by mouth every 4 (four) hours as needed for severe pain (pain score 7-10). 07/06/20   Swinteck, Aaron Edelman, MD  prochlorperazine (COMPAZINE) 10 MG tablet Take 10 mg by mouth every 6 (six) hours as needed for nausea or vomiting.    [provider]  promethazine (PHENERGAN) 25 MG tablet Take 25 mg by mouth every 8 (eight) hours as needed for nausea/vomiting. 05/21/20   [provider]  senna (SENOKOT) 8.6 MG TABS tablet Take 2 tablets (17.2 mg total) by mouth at bedtime. 07/06/20   Swinteck, Aaron Edelman, MD  sertraline (ZOLOFT) 100 MG tablet Take 200 mg by mouth daily. 03/01/20   [provider]  tiZANidine (ZANAFLEX) 4 MG tablet Take 4 mg by mouth at bedtime as needed for muscle spasms.     [provider]  trazodone (DESYREL) 300 MG tablet Take 300 mg by mouth at bedtime. 03/01/20  [provider]  Vitamin D, Ergocalciferol, (DRISDOL) 1.25 MG (50000 UNIT) CAPS capsule Take 50,000 Units by mouth once a week. 03/27/20   [provider]  zolpidem (AMBIEN CR) 12.5 MG CR tablet Take 6.25 mg by mouth at bedtime as needed for sleep. 05/21/20   [provider]      Allergies    Morphine and related and Nsaids    Review of Systems   Review of Systems  Musculoskeletal:  Positive for arthralgias. Negative for back pain.  All other systems reviewed and are negative.  Physical Exam Updated Vital Signs BP (!) 99/56 (BP Location: Right Arm)    Pulse (!) 56    Temp 97.8 F (36.6 C) (Oral)    Resp 18    Ht 5\' 7"  (1.702 m)    Wt 78.7 kg    SpO2 99%    BMI  27.18 kg/m  Physical Exam Vitals and nursing note reviewed.  Constitutional:      General: She is not in acute distress.    Appearance: She is not toxic-appearing.     Comments: Uncomfortable appearing, but not toxic  HENT:     Head: Normocephalic and atraumatic.     Mouth/Throat:     Mouth: Mucous membranes are moist.  Eyes:     General: No scleral icterus.    Extraocular Movements: Extraocular movements intact.     Pupils: Pupils are equal, round, and reactive to light.  Cardiovascular:     Rate and Rhythm: Normal rate and regular rhythm.  Pulmonary:     Effort: Pulmonary effort is normal.     Breath sounds: Normal breath sounds.  Abdominal:     General: Abdomen is flat. Bowel sounds are normal.     Palpations: Abdomen is soft.     Tenderness: There is no abdominal tenderness. There is no guarding or rebound.  Musculoskeletal:        General: Tenderness present. No deformity.     Cervical back: Normal range of motion.     Comments: Tenderness to the lateral aspect of the left hip.  DP pulse dopplerable.  PT pulses palpable.  Sensation intact.  Patient is still able to flex and extend ankle and wiggle toes.  Compartments are soft.  Skin:    General: Skin is warm and dry.  Neurological:     General: No focal deficit present.     Mental Status: She is alert. Mental status is at baseline.    ED Results / Procedures / Treatments   Labs (all labs ordered are listed, but only abnormal results are displayed) Labs Reviewed  CBC WITH DIFFERENTIAL/PLATELET  COMPREHENSIVE METABOLIC PANEL  URINALYSIS, ROUTINE W REFLEX MICROSCOPIC  CBG MONITORING, ED  CBG MONITORING, ED    EKG None  Radiology DG Lumbar Spine Complete  Result Date: 12/28/2021 CLINICAL DATA:  61 year old female status post fall stone at home. EXAM: LUMBAR SPINE - COMPLETE 4+ VIEW COMPARISON:  Lumbar radiographs 07/04/2017, CT lumbar myelogram 09/30/2013. FINDINGS: Osteopenia. Normal lumbar segmentation. Stable  lumbar lordosis and disc spaces. Lumbar vertebrae appear intact. Visible lower thoracic levels appear grossly intact. Chronic bilateral L5-S1 facet arthropathy. Increased density in the pelvis is presumed to be bladder distension. Negative visible bowel gas. IMPRESSION: 1. No acute osseous abnormality identified in the lumbar spine. 2. Query distended urinary bladder. Electronically Signed   By: Genevie Ann M.D.   On: 12/28/2021 08:44   DG HIP UNILAT WITH PELVIS 2-3 VIEWS LEFT  Result Date: 12/28/2021  CLINICAL DATA:  61 year old female status post fall at home. EXAM: DG HIP (WITH OR WITHOUT PELVIS) 2-3V LEFT COMPARISON:  None available. FINDINGS: Moderately impacted left femoral neck fracture, with mild lateral displacement. Femoral head remains normally located. Left intertrochanteric segment appears intact. No superimposed acute pelvic fracture identified. Grossly intact proximal right femur. Pelvic phleboliths. Negative visible bowel gas. IMPRESSION: Moderately impacted acute left femoral neck fracture. Electronically Signed   By: Genevie Ann M.D.   On: 12/28/2021 08:41    Procedures Procedures    Medications Ordered in ED Medications - No data to display  ED Course/ Medical Decision Making/ A&P                           Medical Decision Making Amount and/or Complexity of Data Reviewed Labs: ordered. Radiology: ordered.  Risk Prescription drug management. Decision regarding hospitalization.   61 year old female presents to the emergency department for evaluation of left hip pain after fall.  Differential diagnosis includes is not limited to hip fracture, hip dislocation, contusion, sprain, strain.  Vital signs show hypotension although patient reports this is chronic for her.  Borderline bradycardia.  Afebrile.  Satting well on room air.  Physical exam pertinent for some tenderness to the lateral aspect of the left hip.  No obvious signs of injury or deformity.  No overlying skin changes noted.   The patient has dopplerable DP pulses and palpable PT pulses.  Sensation is intact.  Compartments are soft.  Will obtain x-rays and basic labs.  CMP shows hypocalcemia and mild hypoalbuminemia.  Urinalysis shows moderate leukocytes, however patient is asymptomatic.  CBC shows anemia, however this is improved from patient's previous CBC 1 year ago.  Negative for COVID and flu.  I agree with radiologist interpretation of no obvious abnormality of the L-spine.  There is a moderately impacted acute left femoral neck fracture.  Likelihood of the patient's pain.  Medications ordered in the emergency department include Zofran and fentanyl.  Called and spoke to Dr. Onnie Graham who came and evaluated the patient bedside.  Recommended admission to medicine with surgery possibly on Monday.  Dr. Marylyn Ishihara to admit to medicine.  Final Clinical Impression(s) / ED Diagnoses Final diagnoses:  Closed fracture of neck of left femur, initial encounter [facility])    Rx / [facility] Orders ED Discharge Orders     None         Sherrell Puller, Hershal Coria 12/29/21 2217    Godfrey Pick, MD 12/30/21 1052

## 2021-12-28 NOTE — Progress Notes (Signed)
Orthopedic Tech Progress Note Patient Details:  Traci Bird [birth year] [id number]  Ortho Devices Ortho Device/Splint Location: Trapeze bar Ortho Device/Splint Interventions: Application   Post Interventions Patient Tolerated: Well Instructions Provided: Care of device  [name] 12/28/2021, 7:05 PM

## 2021-12-28 NOTE — ED Triage Notes (Signed)
Pt came in by ems for a fall at home, tripped and fell, hurting in the left hip, possibly dislocated.  Ems gave Fentanyl 153mcg and Ketamine 30. Pt still hurting.

## 2021-12-28 NOTE — H&P (View-Only) (Signed)
Reason for Consult: Mechanical fall with left hip pain Referring Physician: EDP  HPI: Traci Bird is an 61 y.o. female well-known to [facility], status post a left knee revision arthroplasty in July 2021 performed by Dr. Rod Can.  She is also followed in our office for chronic back pain and is in a pain management agreement with Dr. Nelva Bush.  Her baseline narcotic use is 10 mg oxycodone 5 times a day.  Patient reports mechanical fall earlier today with immediate complaints of left hip pain and inability to bear weight.  Past Medical History:  Diagnosis Date   Acute meniscal tear of knee LEFT   Anxiety    Arthritis HIPS , KNEES, HANDS   Chondromalacia of left knee    back   Depression    Diabetes mellitus without complication (HCC)    No meds since gastric bypass surgery   Family history of adverse reaction to anesthesia    Daughter PONV   GERD (gastroesophageal reflux disease)    History of diabetes mellitus NO ISSUE SINCE GASTRIC BYPASS   History of sleep apnea NO ISSUES SINCE GASTRIC BYPASS   Hypothyroidism    Partial removed benign tumor   Neuromuscular disorder (HCC)    sciatica   PONV (postoperative nausea and vomiting)    Sleep apnea    No longer has after gastric bypass surgery    Past Surgical History:  Procedure Laterality Date   ABDOMINAL HYSTERECTOMY  11-06-2005  DR HENLEY   W/ BILATERAL SALPINGO-OOPHORECTOMY   bilateral breast reduction  1992   GASTRIC BYPASS  2008   HYSTEROSCOPY WITH D & C  05-16-2005  DR HENLEY   POLYPECTOMY   JOINT REPLACEMENT Left March 22, 2015   Knee   KNEE ARTHROSCOPY  10/29/2012   Procedure: ARTHROSCOPY KNEE;  Surgeon: Johnn Hai, MD;  Location: [facility];  Service: Orthopedics;  Laterality: Left;  WITH DEBRIDEMENT   KNEE ARTHROSCOPY WITH LATERAL RELEASE  10/29/2012   Procedure: KNEE ARTHROSCOPY WITH LATERAL RELEASE;  Surgeon: Johnn Hai, MD;  Location: [facility];  Service:  Orthopedics;  Laterality: Left;   NASAL SINUS SURGERY     NEUROPLASTY / TRANSPOSITION MEDIAN NERVE AT CARPAL TUNNEL BILATERAL  2002   partial thyoidectomy  1994   PERIPHERAL VASCULAR CATHETERIZATION N/A 10/12/2015   Procedure: Upper Extremity Angiography;  Surgeon: Elam Dutch, MD;  Location: [facility];  Service: Cardiovascular;  Laterality: N/A;   SEPTOPLASTY  1987   tendon repair heel  2011   left   TONSILLECTOMY AND ADENOIDECTOMY  1966   TOTAL KNEE ARTHROPLASTY Left 03/22/2015   Procedure: LEFT TOTAL KNEE ARTHROPLASTY;  Surgeon: Susa Day, MD;  Location: [facility];  Service: Orthopedics;  Laterality: Left;   TOTAL KNEE REVISION Left 07/05/2020   Procedure: LEFT TOTAL KNEE REVISION ARTHROPLASTY;  Surgeon: Rod Can, MD;  Location: [facility];  Service: Orthopedics;  Laterality: Left;   TUBAL LIGATION  1984    Family History  Problem Relation Age of Onset   Diabetes Mother    Hyperlipidemia Mother    Hypertension Mother    Diabetes Father    Hyperlipidemia Father    Heart disease Father        CAD- Open Heart   Colon cancer Neg Hx    Stomach cancer Neg Hx     Social History:  reports that she has never smoked. She has never used smokeless tobacco. She reports that she does not drink alcohol  and does not use drugs.  Allergies:  Allergies  Allergen Reactions   Nsaids Other (See Comments)    Gastric bypass surgery   Morphine And Related Itching    Pump     Medications: Med list reviewed  Results for orders placed or performed during the hospital encounter of 12/28/21 (from the past 48 hour(s))  CBG monitoring, ED     Status: None   Collection Time: 12/28/21  6:54 AM  Result Value Ref Range   Glucose-Capillary 73 70 - 99 mg/dL    Comment: Glucose reference range applies only to samples taken after fasting for at least 8 hours.  CBG monitoring, ED     Status: None   Collection Time: 12/28/21  6:57 AM  Result Value Ref Range   Glucose-Capillary 77 70 - 99  mg/dL    Comment: Glucose reference range applies only to samples taken after fasting for at least 8 hours.  CBC with Differential     Status: Abnormal   Collection Time: 12/28/21  8:31 AM  Result Value Ref Range   WBC 10.5 4.0 - 10.5 K/uL   RBC 4.15 3.87 - 5.11 MIL/uL   Hemoglobin 10.7 (L) 12.0 - 15.0 g/dL   HCT 35.2 (L) 36.0 - 46.0 %   MCV 84.8 80.0 - 100.0 fL   MCH 25.8 (L) 26.0 - 34.0 pg   MCHC 30.4 30.0 - 36.0 g/dL   RDW 14.7 11.5 - 15.5 %   Platelets 190 150 - 400 K/uL   nRBC 0.0 0.0 - 0.2 %   Neutrophils Relative % 69 %   Neutro Abs 7.3 1.7 - 7.7 K/uL   Lymphocytes Relative 24 %   Lymphs Abs 2.5 0.7 - 4.0 K/uL   Monocytes Relative 6 %   Monocytes Absolute 0.6 0.1 - 1.0 K/uL   Eosinophils Relative 1 %   Eosinophils Absolute 0.1 0.0 - 0.5 K/uL   Basophils Relative 0 %   Basophils Absolute 0.0 0.0 - 0.1 K/uL   Immature Granulocytes 0 %   Abs Immature Granulocytes 0.04 0.00 - 0.07 K/uL    Comment: Performed at [facility], [place] [address], [place], [place] [address]  Comprehensive metabolic panel     Status: Abnormal   Collection Time: 12/28/21  8:31 AM  Result Value Ref Range   Sodium 138 135 - 145 mmol/L   Potassium 3.7 3.5 - 5.1 mmol/L   Chloride 107 98 - 111 mmol/L   CO2 23 22 - 32 mmol/L   Glucose, Bld 89 70 - 99 mg/dL    Comment: Glucose reference range applies only to samples taken after fasting for at least 8 hours.   BUN 18 8 - 23 mg/dL   Creatinine, Ser 0.64 0.44 - 1.00 mg/dL   Calcium 8.2 (L) 8.9 - 10.3 mg/dL   Total Protein 6.4 (L) 6.5 - 8.1 g/dL   Albumin 3.3 (L) 3.5 - 5.0 g/dL   AST 22 15 - 41 U/L   ALT 15 0 - 44 U/L   Alkaline Phosphatase 77 38 - 126 U/L   Total Bilirubin 0.4 0.3 - 1.2 mg/dL   GFR, Estimated >60 >60 mL/min    Comment: (NOTE) Calculated using the CKD-EPI Creatinine Equation (2021)    Anion gap 8 5 - 15    Comment: Performed at [facility], [place] [address] [place], [place] [address]   Urinalysis, Routine w reflex microscopic     Status: Abnormal   Collection Time:  12/28/21  8:31 AM  Result Value Ref Range   Color, Urine YELLOW YELLOW   APPearance CLEAR CLEAR   Specific Gravity, Urine 1.019 1.005 - 1.030   pH 5.0 5.0 - 8.0   Glucose, UA NEGATIVE NEGATIVE mg/dL   Hgb urine dipstick NEGATIVE NEGATIVE   Bilirubin Urine NEGATIVE NEGATIVE   Ketones, ur NEGATIVE NEGATIVE mg/dL   Protein, ur NEGATIVE NEGATIVE mg/dL   Nitrite NEGATIVE NEGATIVE   Leukocytes,Ua MODERATE (A) NEGATIVE   RBC / HPF 0-5 0 - 5 RBC/hpf   WBC, UA 6-10 0 - 5 WBC/hpf   Bacteria, UA NONE SEEN NONE SEEN    Comment: Performed at [facility], [place] [address], [place], [place] [address]    DG Lumbar Spine Complete  Result Date: 12/28/2021 CLINICAL DATA:  61 year old female status post fall stone at home. EXAM: LUMBAR SPINE - COMPLETE 4+ VIEW COMPARISON:  Lumbar radiographs 07/04/2017, CT lumbar myelogram 09/30/2013. FINDINGS: Osteopenia. Normal lumbar segmentation. Stable lumbar lordosis and disc spaces. Lumbar vertebrae appear intact. Visible lower thoracic levels appear grossly intact. Chronic bilateral L5-S1 facet arthropathy. Increased density in the pelvis is presumed to be bladder distension. Negative visible bowel gas. IMPRESSION: 1. No acute osseous abnormality identified in the lumbar spine. 2. Query distended urinary bladder. Electronically Signed   By: Genevie Ann M.D.   On: 12/28/2021 08:44   DG HIP UNILAT WITH PELVIS 2-3 VIEWS LEFT  Result Date: 12/28/2021 CLINICAL DATA:  61 year old female status post fall at home. EXAM: DG HIP (WITH OR WITHOUT PELVIS) 2-3V LEFT COMPARISON:  None available. FINDINGS: Moderately impacted left femoral neck fracture, with mild lateral displacement. Femoral head remains normally located. Left intertrochanteric segment appears intact. No superimposed acute pelvic fracture identified. Grossly intact proximal right femur. Pelvic phleboliths. Negative  visible bowel gas. IMPRESSION: Moderately impacted acute left femoral neck fracture. Electronically Signed   By: Genevie Ann M.D.   On: 12/28/2021 08:41     Vitals Temp:  [97.8 F (36.6 C)] 97.8 F (36.6 C) (01/21 0630) Pulse Rate:  [56-82] 82 (01/21 1100) Resp:  [16-18] 17 (01/21 1100) BP: (99-139)/(56-102) 139/93 (01/21 1100) SpO2:  [96 %-100 %] 97 % (01/21 1100) Weight:  [78.7 kg] 78.7 kg (01/21 0631) Body mass index is 27.18 kg/m.  Physical Exam: Patient examined today noted to be in extreme discomfort secondary to left hip pain.  She is alert and oriented.  Denies neck pain.  Nontender with palpation along the clavicles or shoulder girdles.  No gross bone or joint instability in the upper extremities with good grip strength.  Bilateral lower extremity mobility extremely limited secondary to complaints of left hip pain.  Diffuse tenderness with palpation about the left hip.  She is grossly neurovascular intact in both lower extremities.  Plain films of the left hip demonstrate a severely impacted and displaced left femoral neck fracture.     Assessment/Plan: Impression:  1.  Impacted and displaced left femoral neck fracture  2.  Chronic back pain on chronic opioids, in a pain management agreement   Plan:  I have counseled the patient as well as her family member who is present at the bedside regarding today's clinical and radiographic findings as well as the treatment options.  Recommendation would be for hip arthroplasty.  I will reach out to members of the [facility] team for anticipated hip arthroplasty early next week.  During the interim continue with strict bedrest, nonweightbearing left lower extremity.  Position the patient for comfort.  Patient may have regular diet at this time and based on surgical timing will arrange for n.p.o. status.  Hopeful for surgery Monday.  Greatly appreciate medicine service admission for this patient.  Sawyer Kahan M Ralphie Lovelady 12/28/2021, 11:01 AM   Contact # [phone]

## 2021-12-28 NOTE — Consult Note (Signed)
Reason for Consult: Mechanical fall with left hip pain Referring Physician: EDP  HPI: Traci Bird is an 61 y.o. female well-known to [facility], status post a left knee revision arthroplasty in July 2021 performed by Dr. Rod Can.  She is also followed in our office for chronic back pain and is in a pain management agreement with Dr. Nelva Bush.  Her baseline narcotic use is 10 mg oxycodone 5 times a day.  Patient reports mechanical fall earlier today with immediate complaints of left hip pain and inability to bear weight.  Past Medical History:  Diagnosis Date   Acute meniscal tear of knee LEFT   Anxiety    Arthritis HIPS , KNEES, HANDS   Chondromalacia of left knee    back   Depression    Diabetes mellitus without complication (HCC)    No meds since gastric bypass surgery   Family history of adverse reaction to anesthesia    Daughter PONV   GERD (gastroesophageal reflux disease)    History of diabetes mellitus NO ISSUE SINCE GASTRIC BYPASS   History of sleep apnea NO ISSUES SINCE GASTRIC BYPASS   Hypothyroidism    Partial removed benign tumor   Neuromuscular disorder (HCC)    sciatica   PONV (postoperative nausea and vomiting)    Sleep apnea    No longer has after gastric bypass surgery    Past Surgical History:  Procedure Laterality Date   ABDOMINAL HYSTERECTOMY  11-06-2005  DR HENLEY   W/ BILATERAL SALPINGO-OOPHORECTOMY   bilateral breast reduction  1992   GASTRIC BYPASS  2008   HYSTEROSCOPY WITH D & C  05-16-2005  DR HENLEY   POLYPECTOMY   JOINT REPLACEMENT Left March 22, 2015   Knee   KNEE ARTHROSCOPY  10/29/2012   Procedure: ARTHROSCOPY KNEE;  Surgeon: Johnn Hai, MD;  Location: [place];  Service: Orthopedics;  Laterality: Left;  WITH DEBRIDEMENT   KNEE ARTHROSCOPY WITH LATERAL RELEASE  10/29/2012   Procedure: KNEE ARTHROSCOPY WITH LATERAL RELEASE;  Surgeon: Johnn Hai, MD;  Location: [place];  Service:  Orthopedics;  Laterality: Left;   NASAL SINUS SURGERY     NEUROPLASTY / TRANSPOSITION MEDIAN NERVE AT CARPAL TUNNEL BILATERAL  2002   partial thyoidectomy  1994   PERIPHERAL VASCULAR CATHETERIZATION N/A 10/12/2015   Procedure: Upper Extremity Angiography;  Surgeon: Elam Dutch, MD;  Location: [facility];  Service: Cardiovascular;  Laterality: N/A;   SEPTOPLASTY  1987   tendon repair heel  2011   left   TONSILLECTOMY AND ADENOIDECTOMY  1966   TOTAL KNEE ARTHROPLASTY Left 03/22/2015   Procedure: LEFT TOTAL KNEE ARTHROPLASTY;  Surgeon: Susa Day, MD;  Location: [facility];  Service: Orthopedics;  Laterality: Left;   TOTAL KNEE REVISION Left 07/05/2020   Procedure: LEFT TOTAL KNEE REVISION ARTHROPLASTY;  Surgeon: Rod Can, MD;  Location: [facility];  Service: Orthopedics;  Laterality: Left;   TUBAL LIGATION  1984    Family History  Problem Relation Age of Onset   Diabetes Mother    Hyperlipidemia Mother    Hypertension Mother    Diabetes Father    Hyperlipidemia Father    Heart disease Father        CAD- Open Heart   Colon cancer Neg Hx    Stomach cancer Neg Hx     Social History:  reports that she has never smoked. She has never used smokeless tobacco. She reports that she does not drink alcohol  and does not use drugs.  Allergies:  Allergies  Allergen Reactions   Nsaids Other (See Comments)    Gastric bypass surgery   Morphine And Related Itching    Pump     Medications: Med list reviewed  Results for orders placed or performed during the hospital encounter of 12/28/21 (from the past 48 hour(s))  CBG monitoring, ED     Status: None   Collection Time: 12/28/21  6:54 AM  Result Value Ref Range   Glucose-Capillary 73 70 - 99 mg/dL    Comment: Glucose reference range applies only to samples taken after fasting for at least 8 hours.  CBG monitoring, ED     Status: None   Collection Time: 12/28/21  6:57 AM  Result Value Ref Range   Glucose-Capillary 77 70 - 99  mg/dL    Comment: Glucose reference range applies only to samples taken after fasting for at least 8 hours.  CBC with Differential     Status: Abnormal   Collection Time: 12/28/21  8:31 AM  Result Value Ref Range   WBC 10.5 4.0 - 10.5 K/uL   RBC 4.15 3.87 - 5.11 MIL/uL   Hemoglobin 10.7 (L) 12.0 - 15.0 g/dL   HCT 35.2 (L) 36.0 - 46.0 %   MCV 84.8 80.0 - 100.0 fL   MCH 25.8 (L) 26.0 - 34.0 pg   MCHC 30.4 30.0 - 36.0 g/dL   RDW 14.7 11.5 - 15.5 %   Platelets 190 150 - 400 K/uL   nRBC 0.0 0.0 - 0.2 %   Neutrophils Relative % 69 %   Neutro Abs 7.3 1.7 - 7.7 K/uL   Lymphocytes Relative 24 %   Lymphs Abs 2.5 0.7 - 4.0 K/uL   Monocytes Relative 6 %   Monocytes Absolute 0.6 0.1 - 1.0 K/uL   Eosinophils Relative 1 %   Eosinophils Absolute 0.1 0.0 - 0.5 K/uL   Basophils Relative 0 %   Basophils Absolute 0.0 0.0 - 0.1 K/uL   Immature Granulocytes 0 %   Abs Immature Granulocytes 0.04 0.00 - 0.07 K/uL    Comment: Performed at [facility], [place] [address], [place], [place] [address]  Comprehensive metabolic panel     Status: Abnormal   Collection Time: 12/28/21  8:31 AM  Result Value Ref Range   Sodium 138 135 - 145 mmol/L   Potassium 3.7 3.5 - 5.1 mmol/L   Chloride 107 98 - 111 mmol/L   CO2 23 22 - 32 mmol/L   Glucose, Bld 89 70 - 99 mg/dL    Comment: Glucose reference range applies only to samples taken after fasting for at least 8 hours.   BUN 18 8 - 23 mg/dL   Creatinine, Ser 0.64 0.44 - 1.00 mg/dL   Calcium 8.2 (L) 8.9 - 10.3 mg/dL   Total Protein 6.4 (L) 6.5 - 8.1 g/dL   Albumin 3.3 (L) 3.5 - 5.0 g/dL   AST 22 15 - 41 U/L   ALT 15 0 - 44 U/L   Alkaline Phosphatase 77 38 - 126 U/L   Total Bilirubin 0.4 0.3 - 1.2 mg/dL   GFR, Estimated >60 >60 mL/min    Comment: (NOTE) Calculated using the CKD-EPI Creatinine Equation (2021)    Anion gap 8 5 - 15    Comment: Performed at [facility], [place] [address] [place],  [address]   Urinalysis, Routine w reflex microscopic     Status: Abnormal   Collection Time:  12/28/21  8:31 AM  Result Value Ref Range   Color, Urine YELLOW YELLOW   APPearance CLEAR CLEAR   Specific Gravity, Urine 1.019 1.005 - 1.030   pH 5.0 5.0 - 8.0   Glucose, UA NEGATIVE NEGATIVE mg/dL   Hgb urine dipstick NEGATIVE NEGATIVE   Bilirubin Urine NEGATIVE NEGATIVE   Ketones, ur NEGATIVE NEGATIVE mg/dL   Protein, ur NEGATIVE NEGATIVE mg/dL   Nitrite NEGATIVE NEGATIVE   Leukocytes,Ua MODERATE (A) NEGATIVE   RBC / HPF 0-5 0 - 5 RBC/hpf   WBC, UA 6-10 0 - 5 WBC/hpf   Bacteria, UA NONE SEEN NONE SEEN    Comment: Performed at [facility], [place] [address], [place], [place] [address]    DG Lumbar Spine Complete  Result Date: 12/28/2021 CLINICAL DATA:  61 year old female status post fall stone at home. EXAM: LUMBAR SPINE - COMPLETE 4+ VIEW COMPARISON:  Lumbar radiographs 07/04/2017, CT lumbar myelogram 09/30/2013. FINDINGS: Osteopenia. Normal lumbar segmentation. Stable lumbar lordosis and disc spaces. Lumbar vertebrae appear intact. Visible lower thoracic levels appear grossly intact. Chronic bilateral L5-S1 facet arthropathy. Increased density in the pelvis is presumed to be bladder distension. Negative visible bowel gas. IMPRESSION: 1. No acute osseous abnormality identified in the lumbar spine. 2. Query distended urinary bladder. Electronically Signed   By: Genevie Ann M.D.   On: 12/28/2021 08:44   DG HIP UNILAT WITH PELVIS 2-3 VIEWS LEFT  Result Date: 12/28/2021 CLINICAL DATA:  61 year old female status post fall at home. EXAM: DG HIP (WITH OR WITHOUT PELVIS) 2-3V LEFT COMPARISON:  None available. FINDINGS: Moderately impacted left femoral neck fracture, with mild lateral displacement. Femoral head remains normally located. Left intertrochanteric segment appears intact. No superimposed acute pelvic fracture identified. Grossly intact proximal right femur. Pelvic phleboliths. Negative  visible bowel gas. IMPRESSION: Moderately impacted acute left femoral neck fracture. Electronically Signed   By: Genevie Ann M.D.   On: 12/28/2021 08:41     Vitals Temp:  [97.8 F (36.6 C)] 97.8 F (36.6 C) (01/21 0630) Pulse Rate:  [56-82] 82 (01/21 1100) Resp:  [16-18] 17 (01/21 1100) BP: (99-139)/(56-102) 139/93 (01/21 1100) SpO2:  [96 %-100 %] 97 % (01/21 1100) Weight:  [78.7 kg] 78.7 kg (01/21 0631) Body mass index is 27.18 kg/m.  Physical Exam: Patient examined today noted to be in extreme discomfort secondary to left hip pain.  She is alert and oriented.  Denies neck pain.  Nontender with palpation along the clavicles or shoulder girdles.  No gross bone or joint instability in the upper extremities with good grip strength.  Bilateral lower extremity mobility extremely limited secondary to complaints of left hip pain.  Diffuse tenderness with palpation about the left hip.  She is grossly neurovascular intact in both lower extremities.  Plain films of the left hip demonstrate a severely impacted and displaced left femoral neck fracture.     Assessment/Plan: Impression:  1.  Impacted and displaced left femoral neck fracture  2.  Chronic back pain on chronic opioids, in a pain management agreement   Plan:  I have counseled the patient as well as her family member who is present at the bedside regarding today's clinical and radiographic findings as well as the treatment options.  Recommendation would be for hip arthroplasty.  I will reach out to members of the [facility] team for anticipated hip arthroplasty early next week.  During the interim continue with strict bedrest, nonweightbearing left lower extremity.  Position the patient for comfort.  Patient may have regular diet at this time and based on surgical timing will arrange for n.p.o. status.  Hopeful for surgery Monday.  Greatly appreciate medicine service admission for this patient.  Toy Samarin M Khrystyne Arpin 12/28/2021, 11:01 AM   Contact # [phone]

## 2021-12-29 DIAGNOSIS — E039 Hypothyroidism, unspecified: Secondary | ICD-10-CM | POA: Diagnosis present

## 2021-12-29 DIAGNOSIS — D649 Anemia, unspecified: Secondary | ICD-10-CM

## 2021-12-29 DIAGNOSIS — Z8659 Personal history of other mental and behavioral disorders: Secondary | ICD-10-CM

## 2021-12-29 DIAGNOSIS — G894 Chronic pain syndrome: Secondary | ICD-10-CM

## 2021-12-29 DIAGNOSIS — R338 Other retention of urine: Secondary | ICD-10-CM | POA: Diagnosis present

## 2021-12-29 LAB — CBC
HCT: 34.7 % — ABNORMAL LOW (ref 36.0–46.0)
Hemoglobin: 10.5 g/dL — ABNORMAL LOW (ref 12.0–15.0)
MCH: 25.5 pg — ABNORMAL LOW (ref 26.0–34.0)
MCHC: 30.3 g/dL (ref 30.0–36.0)
MCV: 84.4 fL (ref 80.0–100.0)
Platelets: 189 10*3/uL (ref 150–400)
RBC: 4.11 MIL/uL (ref 3.87–5.11)
RDW: 14.8 % (ref 11.5–15.5)
WBC: 7.4 10*3/uL (ref 4.0–10.5)
nRBC: 0 % (ref 0.0–0.2)

## 2021-12-29 LAB — BASIC METABOLIC PANEL
Anion gap: 5 (ref 5–15)
BUN: 12 mg/dL (ref 8–23)
CO2: 28 mmol/L (ref 22–32)
Calcium: 8.6 mg/dL — ABNORMAL LOW (ref 8.9–10.3)
Chloride: 104 mmol/L (ref 98–111)
Creatinine, Ser: 0.69 mg/dL (ref 0.44–1.00)
GFR, Estimated: >60 mL/min (ref >60)
Glucose, Bld: 100 mg/dL — ABNORMAL HIGH (ref 70–99)
Potassium: 3.8 mmol/L (ref 3.5–5.1)
Sodium: 137 mmol/L (ref 135–145)

## 2021-12-29 LAB — HIV ANTIBODY (ROUTINE TESTING W REFLEX): HIV Screen 4th Generation wRfx: NONREACTIVE

## 2021-12-29 MED ORDER — SENNOSIDES-DOCUSATE SODIUM 8.6-50 MG PO TABS
1.0000 | ORAL_TABLET | Freq: Two times a day (BID) | ORAL | Status: DC
  Administered 2021-12-29 – 2021-12-30 (×3): 1 via ORAL
  Filled 2021-12-29 (×3): qty 1

## 2021-12-29 MED ORDER — HYDROMORPHONE HCL 2 MG PO TABS
2.0000 mg | ORAL_TABLET | ORAL | Status: DC | PRN
  Administered 2021-12-29 – 2021-12-31 (×10): 2 mg via ORAL
  Administered 2021-12-31: 09:00:00 4 mg via ORAL
  Filled 2021-12-29 (×6): qty 1
  Filled 2021-12-29 (×2): qty 2
  Filled 2021-12-29 (×3): qty 1

## 2021-12-29 MED ORDER — GABAPENTIN 300 MG PO CAPS
600.0000 mg | ORAL_CAPSULE | Freq: Two times a day (BID) | ORAL | Status: DC
  Administered 2021-12-29 – 2022-01-02 (×9): 600 mg via ORAL
  Filled 2021-12-29 (×9): qty 2

## 2021-12-29 MED ORDER — MUPIROCIN 2 % EX OINT
1.0000 "application " | TOPICAL_OINTMENT | Freq: Two times a day (BID) | CUTANEOUS | Status: DC
  Administered 2021-12-30 – 2022-01-02 (×8): 1 via NASAL
  Filled 2021-12-29 (×2): qty 22

## 2021-12-29 MED ORDER — CHLORHEXIDINE GLUCONATE CLOTH 2 % EX PADS
6.0000 | MEDICATED_PAD | Freq: Every day | CUTANEOUS | Status: DC
  Administered 2021-12-29 – 2021-12-30 (×2): 6 via TOPICAL

## 2021-12-29 MED ORDER — POLYETHYLENE GLYCOL 3350 17 G PO PACK
17.0000 g | PACK | Freq: Two times a day (BID) | ORAL | Status: DC
  Administered 2021-12-29 – 2022-01-02 (×7): 17 g via ORAL
  Filled 2021-12-29 (×7): qty 1

## 2021-12-29 MED ORDER — SODIUM CHLORIDE 0.9 % IV BOLUS
500.0000 mL | Freq: Once | INTRAVENOUS | Status: AC
  Administered 2021-12-29: 500 mL via INTRAVENOUS

## 2021-12-29 NOTE — Progress Notes (Signed)
PROGRESS NOTE    Traci Bird  ZDG:[id number] DOB: [birth year] DOA: 12/28/2021 PCP: [name], NP    Chief Complaint  Patient presents with   Leg Injury    Brief Narrative:  Patient 61 year old female history of chronic pain being followed at the chronic pain clinic, anxiety/depression, hypothyroidism gout up around 4 AM to get something to drink lost her balance and fell on her left side with inability to bear weight and left hip pain.  Patient presented to the ED plain films of the left hip and pelvis done concerning for left femoral neck fracture.  Orthopedics consulted and patient for probable surgical repair 12/30/2021   Assessment & Plan:   Principal Problem:   Closed left hip fracture (HCC) Active Problems:   History of major depression   Hypothyroidism   Normocytic anemia   Acute urinary retention   Chronic pain disorder  #1 impacted and displaced left femoral neck fracture -Secondary to mechanical fall. -Patient with chronic back pain on chronic opioid pain medication. -Plain films of the left hip and pelvis done with moderately impacted acute left femoral neck fracture. -Patient seen in consultation by orthopedics and patient for probable left hip arthroplasty/repair 12/30/2021. -Pain medications adjusted per orthopedics. -Continue current home pain regimen. -Per orthopedics.  2.  Chronic back pain on chronic opioid medications -Patient noted to be in a pain management agreement with Dr Nelva Bush of [facility]. -Continue current home pain regimen of oxycodone 10 mg every 4 hours as needed. -Increase Neurontin to twice daily. -IV Dilaudid dose adjusted per orthopedics and being managed per orthopedics. -Place on Senokot-S twice daily, MiraLAX twice daily..  3.  Urinary retention -Foley catheter. -Voiding trial in 3 to 5 days.  4.  Hypothyroidism -Continue home regimen Synthroid.  5.  Depression/anxiety -Continue home regimen Zoloft, BuSpar as needed.   Klonopin as needed.  6.  Normocytic anemia -Likely secondary to anemia of chronic disease. -Patient with no overt GI bleed. -Check an anemia panel. -Follow H&H. -Transfusion threshold hemoglobin < 7.    DVT prophylaxis: SCDs.  Postop DVT prophylaxis per orthopedics. Code Status: Full Family Communication: Updated patient, husband, daughter at bedside Disposition:   Status is: Inpatient  Remains inpatient appropriate because: Severity of illness       Consultants:  Orthopedics: Dr. Onnie Graham 12/28/2021  Procedures:  Plain films of the left hip and pelvis 12/28/2021 Plain films of the lumbar spine 12/28/2021  Antimicrobials:  None   Subjective: Patient laying in bed.  States when she lays still pain is better controlled and however when she moves she has significant left hip pain.  No chest pain.  No shortness of breath.  Has questions about procedure.  Husband and daughter at bedside.  Patient noted to have some urinary retention per RN with bladder scan with greater than 400 cc of urine.  Objective: Vitals:   12/29/21 1403 12/29/21 1448 12/29/21 1710 12/29/21 1750  BP:      Pulse:      Resp: 16 17 15 18   Temp:      TempSrc:      SpO2:      Weight:      Height:        Intake/Output Summary (Last 24 hours) at 12/29/2021 1800 Last data filed at 12/29/2021 1700 Gross per 24 hour  Intake --  Output 700 ml  Net -700 ml   Filed Weights   12/28/21 0631  Weight: 78.7 kg    Examination:  General  exam: Appears calm and comfortable  Respiratory system: Clear to auscultation. Respiratory effort normal. Cardiovascular system: S1 & S2 heard, RRR. No JVD, murmurs, rubs, gallops or clicks. No pedal edema. Gastrointestinal system: Abdomen is nondistended, soft and nontender. No organomegaly or masses felt. Normal bowel sounds heard. Central nervous system: Alert and oriented. No focal neurological deficits. Extremities: Left hip with some tenderness to palpation.  Skin: No  rashes, lesions or ulcers Psychiatry: Judgement and insight appear normal. Mood & affect appropriate.     Data Reviewed: I have personally reviewed following labs and imaging studies  CBC: Recent Labs  Lab 12/28/21 0831 12/29/21 0444  WBC 10.5 7.4  NEUTROABS 7.3  --   HGB 10.7* 10.5*  HCT 35.2* 34.7*  MCV 84.8 84.4  PLT 190 032    Basic Metabolic Panel: Recent Labs  Lab 12/28/21 0831 12/29/21 0444  NA 138 137  K 3.7 3.8  CL 107 104  CO2 23 28  GLUCOSE 89 100*  BUN 18 12  CREATININE 0.64 0.69  CALCIUM 8.2* 8.6*    GFR: Estimated Creatinine Clearance: 78.7 mL/min (by C-G formula based on SCr of 0.69 mg/dL).  Liver Function Tests: Recent Labs  Lab 12/28/21 0831  AST 22  ALT 15  ALKPHOS 77  BILITOT 0.4  PROT 6.4*  ALBUMIN 3.3*    CBG: Recent Labs  Lab 12/28/21 0654 12/28/21 1224  MGNOIB 70 48     Recent Results (from the past 240 hour(s))  Resp Panel by RT-PCR (Flu A&B, Covid) Nasopharyngeal Swab     Status: None   Collection Time: 12/28/21 10:11 AM   Specimen: Nasopharyngeal Swab; Nasopharyngeal(NP) swabs in vial transport medium  Result Value Ref Range Status   SARS Coronavirus 2 by RT PCR NEGATIVE NEGATIVE Final    Comment: (NOTE) SARS-CoV-2 target nucleic acids are NOT DETECTED.  The SARS-CoV-2 RNA is generally detectable in upper respiratory specimens during the acute phase of infection. The lowest concentration of SARS-CoV-2 viral copies this assay can detect is 138 copies/mL. A negative result does not preclude SARS-Cov-2 infection and should not be used as the sole basis for treatment or other patient management decisions. A negative result may occur with  improper specimen collection/handling, submission of specimen other than nasopharyngeal swab, presence of viral mutation(s) within the areas targeted by this assay, and inadequate number of viral copies(<138 copies/mL). A negative result must be combined with clinical observations,  patient history, and epidemiological information. The expected result is Negative.  Fact Sheet for Patients:  EntrepreneurPulse.com.au  Fact Sheet for Healthcare Providers:  IncredibleEmployment.be  This test is no t yet approved or cleared by the [place] FDA and  has been authorized for detection and/or diagnosis of SARS-CoV-2 by FDA under an Emergency Use Authorization (EUA). This EUA will remain  in effect (meaning this test can be used) for the duration of the COVID-19 declaration under Section 564(b)(1) of the Act, 21 U.S.C.section 360bbb-3(b)(1), unless the authorization is terminated  or revoked sooner.       Influenza A by PCR NEGATIVE NEGATIVE Final   Influenza B by PCR NEGATIVE NEGATIVE Final    Comment: (NOTE) The Xpert Xpress SARS-CoV-2/FLU/RSV plus assay is intended as an aid in the diagnosis of influenza from Nasopharyngeal swab specimens and should not be used as a sole basis for treatment. Nasal washings and aspirates are unacceptable for Xpert Xpress SARS-CoV-2/FLU/RSV testing.  Fact Sheet for Patients: EntrepreneurPulse.com.au  Fact Sheet for Healthcare Providers: IncredibleEmployment.be  This test is  not yet approved or cleared by the [place] and has been authorized for detection and/or diagnosis of SARS-CoV-2 by FDA under an Emergency Use Authorization (EUA). This EUA will remain in effect (meaning this test can be used) for the duration of the COVID-19 declaration under Section 564(b)(1) of the Act, 21 U.S.C. section 360bbb-3(b)(1), unless the authorization is terminated or revoked.  Performed at [facility], [place] [address], [place], [place] [address]          Radiology Studies: DG Lumbar Spine Complete  Result Date: 12/28/2021 CLINICAL DATA:  61 year old female status post fall stone at home. EXAM: LUMBAR SPINE - COMPLETE 4+ VIEW  COMPARISON:  Lumbar radiographs 07/04/2017, CT lumbar myelogram 09/30/2013. FINDINGS: Osteopenia. Normal lumbar segmentation. Stable lumbar lordosis and disc spaces. Lumbar vertebrae appear intact. Visible lower thoracic levels appear grossly intact. Chronic bilateral L5-S1 facet arthropathy. Increased density in the pelvis is presumed to be bladder distension. Negative visible bowel gas. IMPRESSION: 1. No acute osseous abnormality identified in the lumbar spine. 2. Query distended urinary bladder. Electronically Signed   By: Genevie Ann M.D.   On: 12/28/2021 08:44   DG HIP UNILAT WITH PELVIS 2-3 VIEWS LEFT  Result Date: 12/28/2021 CLINICAL DATA:  61 year old female status post fall at home. EXAM: DG HIP (WITH OR WITHOUT PELVIS) 2-3V LEFT COMPARISON:  None available. FINDINGS: Moderately impacted left femoral neck fracture, with mild lateral displacement. Femoral head remains normally located. Left intertrochanteric segment appears intact. No superimposed acute pelvic fracture identified. Grossly intact proximal right femur. Pelvic phleboliths. Negative visible bowel gas. IMPRESSION: Moderately impacted acute left femoral neck fracture. Electronically Signed   By: Genevie Ann M.D.   On: 12/28/2021 08:41        Scheduled Meds:  Chlorhexidine Gluconate Cloth  6 each Topical Daily   enoxaparin (LOVENOX) injection  40 mg Subcutaneous Q24H   gabapentin  600 mg Oral BID   levothyroxine  75 mcg Oral Q0600   polyethylene glycol  17 g Oral BID   senna-docusate  1 tablet Oral BID   sertraline  100 mg Oral QHS   Continuous Infusions:   LOS: 1 day    Time spent: 40 minutes    Irine Seal, MD [facility]   To contact the attending provider between 7A-7P or the covering provider during after hours 7P-7A, please log into the web site [email or web address] and access using universal [place] password for that web site. If you do not have the password, please call the hospital operator.  12/29/2021,  6:00 PM

## 2021-12-29 NOTE — Progress Notes (Signed)
Subjective:    Patient reports pain as 4 on 0-10 scale.   Denies CP or SOB.  Voiding without difficulty. Positive flatus. Objective: Vital signs in last 24 hours: Temp:  [98.6 F (37 C)-99.7 F (37.6 C)] 99.4 F (37.4 C) (01/22 0537) Pulse Rate:  [69-89] 75 (01/22 0537) Resp:  [10-20] 18 (01/22 0537) BP: (82-145)/(47-102) 131/82 (01/22 0537) SpO2:  [90 %-100 %] 100 % (01/22 0537)  Intake/Output from previous day: No intake/output data recorded. Intake/Output this shift: No intake/output data recorded.  Recent Labs    12/28/21 0831 12/29/21 0444  HGB 10.7* 10.5*   Recent Labs    12/28/21 0831 12/29/21 0444  WBC 10.5 7.4  RBC 4.15 4.11  HCT 35.2* 34.7*  PLT 190 189   Recent Labs    12/28/21 0831 12/29/21 0444  NA 138 137  K 3.7 3.8  CL 107 104  CO2 23 28  BUN 18 12  CREATININE 0.64 0.69  GLUCOSE 89 100*  CALCIUM 8.2* 8.6*   No results for input(s): LABPT, INR in the last 72 hours.  Neurologically intact Sensation intact distally Dorsiflexion/Plantar flexion intact Compartment soft  Assessment/Plan:    Principal Problem:   Closed left hip fracture (HCC)   Stable pending left hip arthroplasty tomorrow NPO P MN   Traci Bird 12/29/2021, @NOW 

## 2021-12-30 ENCOUNTER — Inpatient Hospital Stay (HOSPITAL_COMMUNITY): Payer: 59 | Admitting: Certified Registered Nurse Anesthetist

## 2021-12-30 ENCOUNTER — Inpatient Hospital Stay (HOSPITAL_COMMUNITY): Payer: 59

## 2021-12-30 ENCOUNTER — Other Ambulatory Visit: Payer: Self-pay

## 2021-12-30 ENCOUNTER — Encounter (HOSPITAL_COMMUNITY)
Admission: EM | Disposition: A | Discharge status: Home / Self Care | Payer: Self-pay | Source: Home / Self Care | Attending: Internal Medicine

## 2021-12-30 ENCOUNTER — Encounter (HOSPITAL_COMMUNITY): Payer: Self-pay | Admitting: Internal Medicine

## 2021-12-30 HISTORY — PX: TOTAL HIP ARTHROPLASTY: SHX124

## 2021-12-30 LAB — CBC WITH DIFFERENTIAL/PLATELET
Abs Immature Granulocytes: 0.03 10*3/uL (ref 0.00–0.07)
Basophils Absolute: 0 10*3/uL (ref 0.0–0.1)
Basophils Relative: 0 %
Eosinophils Absolute: 0.2 10*3/uL (ref 0.0–0.5)
Eosinophils Relative: 3 %
HCT: 37.6 % (ref 36.0–46.0)
Hemoglobin: 11.6 g/dL — ABNORMAL LOW (ref 12.0–15.0)
Immature Granulocytes: 0 %
Lymphocytes Relative: 17 %
Lymphs Abs: 1.3 10*3/uL (ref 0.7–4.0)
MCH: 25.7 pg — ABNORMAL LOW (ref 26.0–34.0)
MCHC: 30.9 g/dL (ref 30.0–36.0)
MCV: 83.2 fL (ref 80.0–100.0)
Monocytes Absolute: 0.6 10*3/uL (ref 0.1–1.0)
Monocytes Relative: 8 %
Neutro Abs: 5.4 10*3/uL (ref 1.7–7.7)
Neutrophils Relative %: 72 %
Platelets: 196 10*3/uL (ref 150–400)
RBC: 4.52 MIL/uL (ref 3.87–5.11)
RDW: 14.7 % (ref 11.5–15.5)
WBC: 7.6 10*3/uL (ref 4.0–10.5)
nRBC: 0 % (ref 0.0–0.2)

## 2021-12-30 LAB — IRON AND TIBC
Iron: 47 ug/dL (ref 28–170)
Saturation Ratios: 10 % — ABNORMAL LOW (ref 10.4–31.8)
TIBC: 464 ug/dL — ABNORMAL HIGH (ref 250–450)
UIBC: 417 ug/dL

## 2021-12-30 LAB — CBC
HCT: 33.3 % — ABNORMAL LOW (ref 36.0–46.0)
Hemoglobin: 10.2 g/dL — ABNORMAL LOW (ref 12.0–15.0)
MCH: 25.7 pg — ABNORMAL LOW (ref 26.0–34.0)
MCHC: 30.6 g/dL (ref 30.0–36.0)
MCV: 83.9 fL (ref 80.0–100.0)
Platelets: 222 10*3/uL (ref 150–400)
RBC: 3.97 MIL/uL (ref 3.87–5.11)
RDW: 14.7 % (ref 11.5–15.5)
WBC: 11.3 10*3/uL — ABNORMAL HIGH (ref 4.0–10.5)
nRBC: 0 % (ref 0.0–0.2)

## 2021-12-30 LAB — BASIC METABOLIC PANEL
Anion gap: 7 (ref 5–15)
BUN: 10 mg/dL (ref 8–23)
CO2: 27 mmol/L (ref 22–32)
Calcium: 9 mg/dL (ref 8.9–10.3)
Chloride: 103 mmol/L (ref 98–111)
Creatinine, Ser: 0.59 mg/dL (ref 0.44–1.00)
GFR, Estimated: >60 mL/min (ref >60)
Glucose, Bld: 120 mg/dL — ABNORMAL HIGH (ref 70–99)
Potassium: 3.7 mmol/L (ref 3.5–5.1)
Sodium: 137 mmol/L (ref 135–145)

## 2021-12-30 LAB — MAGNESIUM: Magnesium: 2 mg/dL (ref 1.7–2.4)

## 2021-12-30 LAB — SURGICAL PCR SCREEN
MRSA, PCR: POSITIVE — AB
Staphylococcus aureus: POSITIVE — AB

## 2021-12-30 LAB — VITAMIN B12: Vitamin B-12: 197 pg/mL (ref 180–914)

## 2021-12-30 LAB — CREATININE, SERUM
Creatinine, Ser: 0.72 mg/dL (ref 0.44–1.00)
GFR, Estimated: >60 mL/min (ref >60)

## 2021-12-30 LAB — FOLATE: Folate: 18.5 ng/mL (ref >5.9)

## 2021-12-30 LAB — FERRITIN: Ferritin: 28 ng/mL (ref 11–307)

## 2021-12-30 SURGERY — ARTHROPLASTY, HIP, TOTAL, ANTERIOR APPROACH
Anesthesia: General | Site: Hip | Laterality: Left

## 2021-12-30 MED ORDER — PROMETHAZINE HCL 25 MG/ML IJ SOLN
INTRAMUSCULAR | Status: AC
  Filled 2021-12-30: qty 1

## 2021-12-30 MED ORDER — SENNA 8.6 MG PO TABS
1.0000 | ORAL_TABLET | Freq: Two times a day (BID) | ORAL | Status: DC
  Administered 2021-12-30 – 2022-01-02 (×5): 8.6 mg via ORAL
  Filled 2021-12-30 (×5): qty 1

## 2021-12-30 MED ORDER — CHLORHEXIDINE GLUCONATE 4 % EX LIQD: 60.0000 mL | Freq: Once | CUTANEOUS | Status: DC

## 2021-12-30 MED ORDER — SODIUM CHLORIDE 0.9 % IR SOLN
Status: DC | PRN
  Administered 2021-12-30: 3000 mL
  Administered 2021-12-30: 2000 mL

## 2021-12-30 MED ORDER — PROPOFOL 500 MG/50ML IV EMUL
INTRAVENOUS | Status: DC | PRN
Start: 2021-12-30 — End: 2021-12-30
  Administered 2021-12-30: 50 ug/kg/min via INTRAVENOUS

## 2021-12-30 MED ORDER — TRANEXAMIC ACID-NACL 1000-0.7 MG/100ML-% IV SOLN
1000.0000 mg | Freq: Once | INTRAVENOUS | Status: AC
  Filled 2021-12-30 (×2): qty 100

## 2021-12-30 MED ORDER — PROPOFOL 10 MG/ML IV BOLUS
INTRAVENOUS | Status: AC
  Filled 2021-12-30: qty 20

## 2021-12-30 MED ORDER — TRANEXAMIC ACID-NACL 1000-0.7 MG/100ML-% IV SOLN
INTRAVENOUS | Status: AC
  Administered 2021-12-30: 1000 mg via INTRAVENOUS
  Filled 2021-12-30: qty 100

## 2021-12-30 MED ORDER — FENTANYL CITRATE (PF) 100 MCG/2ML IJ SOLN
INTRAMUSCULAR | Status: DC | PRN
  Administered 2021-12-30 (×2): 25 ug via INTRAVENOUS
  Administered 2021-12-30: 100 ug via INTRAVENOUS
  Administered 2021-12-30: 25 ug via INTRAVENOUS

## 2021-12-30 MED ORDER — PHENYLEPHRINE 40 MCG/ML (10ML) SYRINGE FOR IV PUSH (FOR BLOOD PRESSURE SUPPORT)
PREFILLED_SYRINGE | INTRAVENOUS | Status: DC | PRN
Start: 2021-12-30 — End: 2021-12-30
  Administered 2021-12-30: 80 ug via INTRAVENOUS
  Administered 2021-12-30: 120 ug via INTRAVENOUS

## 2021-12-30 MED ORDER — VANCOMYCIN HCL IN DEXTROSE 1-5 GM/200ML-% IV SOLN
1000.0000 mg | Freq: Two times a day (BID) | INTRAVENOUS | Status: AC
  Administered 2021-12-30: 1000 mg via INTRAVENOUS
  Filled 2021-12-30: qty 200

## 2021-12-30 MED ORDER — OXYCODONE HCL 5 MG/5ML PO SOLN: 5.0000 mg | Freq: Once | ORAL | Status: DC | PRN

## 2021-12-30 MED ORDER — DOCUSATE SODIUM 100 MG PO CAPS
100.0000 mg | ORAL_CAPSULE | Freq: Two times a day (BID) | ORAL | Status: DC
  Administered 2021-12-30 – 2022-01-02 (×6): 100 mg via ORAL
  Filled 2021-12-30 (×6): qty 1

## 2021-12-30 MED ORDER — MENTHOL 3 MG MT LOZG: 1.0000 | LOZENGE | OROMUCOSAL | Status: DC | PRN

## 2021-12-30 MED ORDER — KETOROLAC TROMETHAMINE 30 MG/ML IJ SOLN
INTRAMUSCULAR | Status: AC
  Filled 2021-12-30: qty 1

## 2021-12-30 MED ORDER — OXYCODONE HCL 5 MG PO TABS: 5.0000 mg | ORAL_TABLET | Freq: Once | ORAL | Status: DC | PRN

## 2021-12-30 MED ORDER — PHENYLEPHRINE HCL-NACL 20-0.9 MG/250ML-% IV SOLN
INTRAVENOUS | Status: DC | PRN
  Administered 2021-12-30: 50 ug/min via INTRAVENOUS

## 2021-12-30 MED ORDER — POLYETHYLENE GLYCOL 3350 17 G PO PACK: 17.0000 g | PACK | Freq: Every day | ORAL | Status: DC | PRN

## 2021-12-30 MED ORDER — FENTANYL CITRATE PF 50 MCG/ML IJ SOSY
25.0000 ug | PREFILLED_SYRINGE | INTRAMUSCULAR | Status: DC | PRN
  Administered 2021-12-30 (×3): 50 ug via INTRAVENOUS

## 2021-12-30 MED ORDER — ROCURONIUM BROMIDE 10 MG/ML (PF) SYRINGE
PREFILLED_SYRINGE | INTRAVENOUS | Status: DC | PRN
  Administered 2021-12-30: 50 mg via INTRAVENOUS
  Administered 2021-12-30: 20 mg via INTRAVENOUS

## 2021-12-30 MED ORDER — SUGAMMADEX SODIUM 200 MG/2ML IV SOLN
INTRAVENOUS | Status: DC | PRN
  Administered 2021-12-30: 200 mg via INTRAVENOUS

## 2021-12-30 MED ORDER — PHENYLEPHRINE 40 MCG/ML (10ML) SYRINGE FOR IV PUSH (FOR BLOOD PRESSURE SUPPORT)
PREFILLED_SYRINGE | INTRAVENOUS | Status: AC
  Filled 2021-12-30: qty 10

## 2021-12-30 MED ORDER — MIDAZOLAM HCL 2 MG/2ML IJ SOLN
INTRAMUSCULAR | Status: AC
  Filled 2021-12-30: qty 2

## 2021-12-30 MED ORDER — ONDANSETRON HCL 4 MG/2ML IJ SOLN: 4.0000 mg | Freq: Four times a day (QID) | INTRAMUSCULAR | Status: DC | PRN

## 2021-12-30 MED ORDER — PROPOFOL 10 MG/ML IV BOLUS
INTRAVENOUS | Status: DC | PRN
  Administered 2021-12-30: 150 mg via INTRAVENOUS

## 2021-12-30 MED ORDER — LACTATED RINGERS IV SOLN: INTRAVENOUS | Status: DC

## 2021-12-30 MED ORDER — SODIUM CHLORIDE (PF) 0.9 % IJ SOLN
INTRAMUSCULAR | Status: DC | PRN
  Administered 2021-12-30: 30 mL
  Administered 2021-12-30: 1000 mL

## 2021-12-30 MED ORDER — FLEET ENEMA 7-19 GM/118ML RE ENEM: 1.0000 | ENEMA | Freq: Once | RECTAL | Status: DC | PRN

## 2021-12-30 MED ORDER — METOCLOPRAMIDE HCL 5 MG PO TABS
5.0000 mg | ORAL_TABLET | Freq: Three times a day (TID) | ORAL | Status: DC | PRN
  Administered 2021-12-31: 14:00:00 5 mg via ORAL
  Filled 2021-12-30: qty 1

## 2021-12-30 MED ORDER — ACETAMINOPHEN 500 MG PO TABS
1000.0000 mg | ORAL_TABLET | Freq: Once | ORAL | Status: AC
  Administered 2021-12-30: 1000 mg via ORAL
  Filled 2021-12-30: qty 2

## 2021-12-30 MED ORDER — ENOXAPARIN SODIUM 40 MG/0.4ML IJ SOSY
40.0000 mg | PREFILLED_SYRINGE | INTRAMUSCULAR | Status: DC
  Administered 2021-12-31 – 2022-01-02 (×2): 40 mg via SUBCUTANEOUS
  Filled 2021-12-30 (×2): qty 0.4

## 2021-12-30 MED ORDER — TRANEXAMIC ACID-NACL 1000-0.7 MG/100ML-% IV SOLN
1000.0000 mg | INTRAVENOUS | Status: AC
  Administered 2021-12-30: 1000 mg via INTRAVENOUS
  Filled 2021-12-30: qty 100

## 2021-12-30 MED ORDER — CEFAZOLIN SODIUM-DEXTROSE 2-4 GM/100ML-% IV SOLN
2.0000 g | INTRAVENOUS | Status: AC
  Administered 2021-12-30: 2 g via INTRAVENOUS
  Filled 2021-12-30: qty 100

## 2021-12-30 MED ORDER — LIDOCAINE 2% (20 MG/ML) 5 ML SYRINGE
INTRAMUSCULAR | Status: DC | PRN
  Administered 2021-12-30: 50 mg via INTRAVENOUS

## 2021-12-30 MED ORDER — POVIDONE-IODINE 10 % EX SWAB
2.0000 "application " | Freq: Once | CUTANEOUS | Status: AC
  Administered 2021-12-30: 2 via TOPICAL

## 2021-12-30 MED ORDER — ONDANSETRON HCL 4 MG PO TABS
4.0000 mg | ORAL_TABLET | Freq: Four times a day (QID) | ORAL | Status: DC | PRN
  Administered 2021-12-31: 09:00:00 4 mg via ORAL
  Filled 2021-12-30: qty 1

## 2021-12-30 MED ORDER — BUPIVACAINE-EPINEPHRINE 0.25% -1:200000 IJ SOLN
INTRAMUSCULAR | Status: DC | PRN
  Administered 2021-12-30: 30 mL

## 2021-12-30 MED ORDER — BUPIVACAINE-EPINEPHRINE (PF) 0.25% -1:200000 IJ SOLN
INTRAMUSCULAR | Status: AC
  Filled 2021-12-30: qty 30

## 2021-12-30 MED ORDER — METOCLOPRAMIDE HCL 5 MG/ML IJ SOLN: 5.0000 mg | Freq: Three times a day (TID) | INTRAMUSCULAR | Status: DC | PRN

## 2021-12-30 MED ORDER — KETOROLAC TROMETHAMINE 30 MG/ML IJ SOLN
INTRAMUSCULAR | Status: DC | PRN
  Administered 2021-12-30: 30 mg

## 2021-12-30 MED ORDER — PROMETHAZINE HCL 25 MG/ML IJ SOLN
6.2500 mg | INTRAMUSCULAR | Status: DC | PRN
  Administered 2021-12-30: 6.25 mg via INTRAVENOUS

## 2021-12-30 MED ORDER — DEXAMETHASONE SODIUM PHOSPHATE 10 MG/ML IJ SOLN
INTRAMUSCULAR | Status: DC | PRN
  Administered 2021-12-30: 8 mg via INTRAVENOUS

## 2021-12-30 MED ORDER — SORBITOL 70 % SOLN
30.0000 mL | Freq: Every day | Status: DC | PRN
  Filled 2021-12-30: qty 30

## 2021-12-30 MED ORDER — POVIDONE-IODINE 10 % EX SWAB: 2.0000 "application " | Freq: Once | CUTANEOUS | Status: DC

## 2021-12-30 MED ORDER — MELATONIN 5 MG PO TABS
5.0000 mg | ORAL_TABLET | Freq: Once | ORAL | Status: AC
  Administered 2021-12-30: 5 mg via ORAL
  Filled 2021-12-30: qty 1

## 2021-12-30 MED ORDER — MIDAZOLAM HCL 5 MG/5ML IJ SOLN
INTRAMUSCULAR | Status: DC | PRN
Start: 2021-12-30 — End: 2021-12-30
  Administered 2021-12-30: 2 mg via INTRAVENOUS

## 2021-12-30 MED ORDER — FENTANYL CITRATE PF 50 MCG/ML IJ SOSY
PREFILLED_SYRINGE | INTRAMUSCULAR | Status: AC
  Filled 2021-12-30: qty 1

## 2021-12-30 MED ORDER — LIDOCAINE HCL (PF) 2 % IJ SOLN
INTRAMUSCULAR | Status: AC
  Filled 2021-12-30: qty 5

## 2021-12-30 MED ORDER — PHENOL 1.4 % MT LIQD: 1.0000 | OROMUCOSAL | Status: DC | PRN

## 2021-12-30 MED ORDER — ONDANSETRON HCL 4 MG/2ML IJ SOLN
INTRAMUSCULAR | Status: DC | PRN
Start: 2021-12-30 — End: 2021-12-30
  Administered 2021-12-30: 4 mg via INTRAVENOUS

## 2021-12-30 MED ORDER — IRRISEPT - 450ML BOTTLE WITH 0.05% CHG IN STERILE WATER, USP 99.95% OPTIME
TOPICAL | Status: DC | PRN
Start: 2021-12-30 — End: 2021-12-30
  Administered 2021-12-30: 450 mL via TOPICAL

## 2021-12-30 MED ORDER — WATER FOR IRRIGATION, STERILE IR SOLN
Status: DC | PRN
  Administered 2021-12-30: 2000 mL

## 2021-12-30 MED ORDER — ROCURONIUM BROMIDE 10 MG/ML (PF) SYRINGE
PREFILLED_SYRINGE | INTRAVENOUS | Status: AC
  Filled 2021-12-30: qty 10

## 2021-12-30 MED ORDER — DEXAMETHASONE SODIUM PHOSPHATE 10 MG/ML IJ SOLN
INTRAMUSCULAR | Status: AC
  Filled 2021-12-30: qty 1

## 2021-12-30 MED ORDER — FENTANYL CITRATE PF 50 MCG/ML IJ SOSY
PREFILLED_SYRINGE | INTRAMUSCULAR | Status: AC
  Filled 2021-12-30: qty 2

## 2021-12-30 MED ORDER — FENTANYL CITRATE (PF) 250 MCG/5ML IJ SOLN
INTRAMUSCULAR | Status: AC
  Filled 2021-12-30: qty 5

## 2021-12-30 MED ORDER — ISOPROPYL ALCOHOL 70 % SOLN
Status: DC | PRN
  Administered 2021-12-30: 1 via TOPICAL

## 2021-12-30 MED ORDER — ONDANSETRON HCL 4 MG/2ML IJ SOLN
INTRAMUSCULAR | Status: AC
  Filled 2021-12-30: qty 2

## 2021-12-30 MED ORDER — DEXTROSE-NACL 5-0.9 % IV SOLN: INTRAVENOUS | Status: DC

## 2021-12-30 SURGICAL SUPPLY — 62 items
BAG COUNTER SPONGE SURGICOUNT (BAG) ×2 IMPLANT
BAG DECANTER FOR FLEXI CONT (MISCELLANEOUS) IMPLANT
BAG ZIPLOCK 12X15 (MISCELLANEOUS) ×1 IMPLANT
BIT DRILL RINGLOC QUICK CONN (BIT) IMPLANT
CHLORAPREP W/TINT 26 (MISCELLANEOUS) ×2 IMPLANT
COVER PERINEAL POST (MISCELLANEOUS) ×2 IMPLANT
COVER SURGICAL LIGHT HANDLE (MISCELLANEOUS) ×2 IMPLANT
DECANTER SPIKE VIAL GLASS SM (MISCELLANEOUS) ×2 IMPLANT
DERMABOND ADVANCED (GAUZE/BANDAGES/DRESSINGS) ×2
DERMABOND ADVANCED .7 DNX12 (GAUZE/BANDAGES/DRESSINGS) ×2 IMPLANT
DRAPE IMP U-DRAPE 54X76 (DRAPES) ×2 IMPLANT
DRAPE SHEET LG 3/4 BI-LAMINATE (DRAPES) ×6 IMPLANT
DRAPE STERI IOBAN 125X83 (DRAPES) ×2 IMPLANT
DRAPE U-SHAPE 47X51 STRL (DRAPES) ×4 IMPLANT
DRILL BIT RINGLOC QUICK CONN (BIT) ×2
DRSG AQUACEL AG ADV 3.5X10 (GAUZE/BANDAGES/DRESSINGS) ×2 IMPLANT
ELECT REM PT RETURN 15FT ADLT (MISCELLANEOUS) ×2 IMPLANT
GAUZE SPONGE 4X4 12PLY STRL (GAUZE/BANDAGES/DRESSINGS) ×2 IMPLANT
GLOVE SRG 8 PF TXTR STRL LF DI (GLOVE) ×1 IMPLANT
GLOVE SURG ENC MOIS LTX SZ8.5 (GLOVE) ×4 IMPLANT
GLOVE SURG ENC TEXT LTX SZ7.5 (GLOVE) ×4 IMPLANT
GLOVE SURG UNDER POLY LF SZ8 (GLOVE) ×2
GLOVE SURG UNDER POLY LF SZ8.5 (GLOVE) ×2 IMPLANT
GOWN SPEC L3 XXLG W/TWL (GOWN DISPOSABLE) ×2 IMPLANT
GOWN STRL REUS W/TWL XL LVL3 (GOWN DISPOSABLE) ×2 IMPLANT
HANDPIECE INTERPULSE COAX TIP (DISPOSABLE) ×2
HEAD CERAMIC BIOLOX 32 TP1 -3 (Head) ×1 IMPLANT
HOLDER FOLEY CATH W/STRAP (MISCELLANEOUS) ×1 IMPLANT
HOOD PEEL AWAY FLYTE STAYCOOL (MISCELLANEOUS) ×6 IMPLANT
JET LAVAGE IRRISEPT WOUND (IRRIGATION / IRRIGATOR) ×2
KIT TURNOVER KIT A (KITS) ×1 IMPLANT
LAVAGE JET IRRISEPT WOUND (IRRIGATION / IRRIGATOR) ×1 IMPLANT
LINER ACETAB G7 VE +5 32 C (Liner) ×1 IMPLANT
MANIFOLD NEPTUNE II (INSTRUMENTS) ×2 IMPLANT
MARKER SKIN DUAL TIP RULER LAB (MISCELLANEOUS) ×2 IMPLANT
NDL SAFETY ECLIPSE 18X1.5 (NEEDLE) ×1 IMPLANT
NDL SPNL 18GX3.5 QUINCKE PK (NEEDLE) ×1 IMPLANT
NEEDLE HYPO 18GX1.5 SHARP (NEEDLE) ×2
NEEDLE SPNL 18GX3.5 QUINCKE PK (NEEDLE) ×2 IMPLANT
PACK ANTERIOR HIP CUSTOM (KITS) ×2 IMPLANT
PENCIL SMOKE EVACUATOR (MISCELLANEOUS) IMPLANT
SAW OSC TIP CART 19.5X105X1.3 (SAW) ×2 IMPLANT
SCREW BN 30X6.5XST STRL ACTB (Screw) IMPLANT
SCREW BONE  6.5X30 (Screw) ×2 IMPLANT
SEALER BIPOLAR AQUA 6.0 (INSTRUMENTS) ×2 IMPLANT
SET HNDPC FAN SPRY TIP SCT (DISPOSABLE) ×1 IMPLANT
SHELL ACET G7 3H 48 SZC (Shell) ×1 IMPLANT
SPONGE T-LAP 18X18 ~~LOC~~+RFID (SPONGE) ×6 IMPLANT
STAPLER INSORB 30 2030 C-SECTI (MISCELLANEOUS) IMPLANT
STAPLER VISISTAT 35W (STAPLE) ×1 IMPLANT
STEM FEM CMTL 15X150 133D (Stem) ×1 IMPLANT
SUT MNCRL AB 3-0 PS2 18 (SUTURE) ×2 IMPLANT
SUT MON AB 2-0 CT1 36 (SUTURE) ×2 IMPLANT
SUT STRATAFIX PDO 1 14 VIOLET (SUTURE) ×2
SUT STRATFX PDO 1 14 VIOLET (SUTURE) ×1
SUT VIC AB 2-0 CT1 27 (SUTURE)
SUT VIC AB 2-0 CT1 TAPERPNT 27 (SUTURE) IMPLANT
SUTURE STRATFX PDO 1 14 VIOLET (SUTURE) ×1 IMPLANT
SYR 3ML LL SCALE MARK (SYRINGE) ×2 IMPLANT
TRAY FOLEY MTR SLVR 16FR STAT (SET/KITS/TRAYS/PACK) IMPLANT
TUBE SUCTION HIGH CAP CLEAR NV (SUCTIONS) ×2 IMPLANT
WATER STERILE IRR 1000ML POUR (IV SOLUTION) ×3 IMPLANT

## 2021-12-30 NOTE — Anesthesia Procedure Notes (Signed)
Procedure Name: Intubation Date/Time: 12/30/2021 5:20 PM Performed by: Montel Clock, CRNA Pre-anesthesia Checklist: Patient identified, Emergency Drugs available, Suction available, Patient being monitored and Timeout performed Patient Re-evaluated:Patient Re-evaluated prior to induction Oxygen Delivery Method: Circle system utilized Preoxygenation: Pre-oxygenation with 100% oxygen Induction Type: IV induction Ventilation: Mask ventilation without difficulty and Oral airway inserted - appropriate to patient size Laryngoscope Size: Mac and 3 Grade View: Grade I Tube type: Oral Tube size: 7.0 mm Number of attempts: 1 Airway Equipment and Method: Stylet Placement Confirmation: ETT inserted through vocal cords under direct vision, positive ETCO2 and breath sounds checked- equal and bilateral Secured at: 21 cm Tube secured with: Tape Dental Injury: Teeth and Oropharynx as per pre-operative assessment

## 2021-12-30 NOTE — Discharge Instructions (Addendum)
Dr. Rod Can Joint Replacement Specialist [facility] [address] [place], [place] [address] [phone]   TOTAL HIP REPLACEMENT POSTOPERATIVE DIRECTIONS    Hip Rehabilitation, Guidelines Following Surgery   WEIGHT BEARING Weight bearing as tolerated with assist device (walker, cane, etc) as directed, use it as long as suggested by your surgeon or therapist, typically at least 4-6 weeks.  The results of a hip operation are greatly improved after range of motion and muscle strengthening exercises. Follow all safety measures which are given to protect your hip. If any of these exercises cause increased pain or swelling in your joint, decrease the amount until you are comfortable again. Then slowly increase the exercises. Call your caregiver if you have problems or questions.   HOME CARE INSTRUCTIONS  Most of the following instructions are designed to prevent the dislocation of your new hip.  Remove items at home which could result in a fall. This includes throw rugs or furniture in walking pathways.  Continue medications as instructed at time of discharge. You may have some home medications which will be placed on hold until you complete the course of blood thinner medication. You may start showering once you are discharged home. Do not remove your dressing. Do not put on socks or shoes without following the instructions of your caregivers.   Sit on chairs with arms. Use the chair arms to help push yourself up when arising.  Arrange for the use of a toilet seat elevator so you are not sitting low.  Walk with walker as instructed.  You may resume a sexual relationship in one month or when given the OK by your caregiver.  Use walker as long as suggested by your caregivers.  You may put full weight on your legs and walk as much as is comfortable. Avoid periods of inactivity such as sitting longer than an hour when not asleep. This helps prevent blood  clots.  You may return to work once you are cleared by Engineer, production.  Do not drive a car for 6 weeks or until released by your surgeon.  Do not drive while taking narcotics.  Wear elastic stockings for two weeks following surgery during the day but you may remove then at night.  Make sure you keep all of your appointments after your operation with all of your doctors and caregivers. You should call the office at the above phone number and make an appointment for approximately two weeks after the date of your surgery. Please pick up a stool softener and laxative for home use as long as you are requiring pain medications. ICE to the affected hip every three hours for 30 minutes at a time and then as needed for pain and swelling. Continue to use ice on the hip for pain and swelling from surgery. You may notice swelling that will progress down to the foot and ankle.  This is normal after surgery.  Elevate the leg when you are not up walking on it.   It is important for you to complete the blood thinner medication as prescribed by your doctor. Continue to use the breathing machine which will help keep your temperature down.  It is common for your temperature to cycle up and down following surgery, especially at night when you are not up moving around and exerting yourself.  The breathing machine keeps your lungs expanded and your temperature down.  RANGE OF MOTION AND STRENGTHENING EXERCISES  These exercises are designed to help you  keep full movement of your hip joint. Follow your caregiver's or physical therapist's instructions. Perform all exercises about fifteen times, three times per day or as directed. Exercise both hips, even if you have had only one joint replacement. These exercises can be done on a training (exercise) mat, on the floor, on a table or on a bed. Use whatever works the best and is most comfortable for you. Use music or television while you are exercising so that the exercises are a  pleasant break in your day. This will make your life better with the exercises acting as a break in routine you can look forward to.  Lying on your back, slowly slide your foot toward your buttocks, raising your knee up off the floor. Then slowly slide your foot back down until your leg is straight again.  Lying on your back spread your legs as far apart as you can without causing discomfort.  Lying on your side, raise your upper leg and foot straight up from the floor as far as is comfortable. Slowly lower the leg and repeat.  Lying on your back, tighten up the muscle in the front of your thigh (quadriceps muscles). You can do this by keeping your leg straight and trying to raise your heel off the floor. This helps strengthen the largest muscle supporting your knee.  Lying on your back, tighten up the muscles of your buttocks both with the legs straight and with the knee bent at a comfortable angle while keeping your heel on the floor.   SKILLED REHAB INSTRUCTIONS: If the patient is transferred to a skilled rehab facility following release from the hospital, a list of the current medications will be sent to the facility for the patient to continue.  When discharged from the skilled rehab facility, please have the facility set up the patient's [place] prior to being released. Also, the skilled facility will be responsible for providing the patient with their medications at time of release from the facility to include their pain medication and their blood thinner medication. If the patient is still at the rehab facility at time of the two week follow up appointment, the skilled rehab facility will also need to assist the patient in arranging follow up appointment in our office and any transportation needs.  POST-OPERATIVE OPIOID TAPER INSTRUCTIONS: It is important to wean off of your opioid medication as soon as possible. If you do not need pain medication after your surgery it is ok  to stop day one. Opioids include: Codeine, Hydrocodone(Norco, Vicodin), Oxycodone(Percocet, oxycontin) and hydromorphone amongst others.  Long term and even short term use of opiods can cause: Increased pain response Dependence Constipation Depression Respiratory depression And more.  Withdrawal symptoms can include Flu like symptoms Nausea, vomiting And more Techniques to manage these symptoms Hydrate well Eat regular healthy meals Stay active Use relaxation techniques(deep breathing, meditating, yoga) Do Not substitute Alcohol to help with tapering If you have been on opioids for less than two weeks and do not have pain than it is ok to stop all together.  Plan to wean off of opioids This plan should start within one week post op of your joint replacement. Maintain the same interval or time between taking each dose and first decrease the dose.  Cut the total daily intake of opioids by one tablet each day Next start to increase the time between doses. The last dose that should be eliminated is the evening dose.    MAKE  SURE YOU:  Understand these instructions.  Will watch your condition.  Will get help right away if you are not doing well or get worse.  Pick up stool softner and laxative for home use following surgery while on pain medications. Do not remove your dressing. The dressing is waterproof--it is OK to take showers. Continue to use ice for pain and swelling after surgery. Do not use any lotions or creams on the incision until instructed by your surgeon. Total Hip Protocol.               Vitamin and Mineral Recommendations   Multivitamin options: chewable or capsule Bariatric Advantage Advanced Multi EA Celebrate MultiComplete with 36 or 45 mg Iron  Opurity: Bypass and Sleeve Optimized Chewable [employer]: Bariatric Multivitamin with 45mg  Iron Chewable   Follow directions on label on how many to take daily.  Calcium options: -chewable,  liquid or soft chew Brands: TUMS, VIACTIV, CALTRATE, BARIATRIC ADVANTAGE, CELEBRATE Recommendation is 1500 mg daily, 500 mg three times daily is most absorbable  Do not take multivitamin at the same time as your calcium supplements. Separate at least 2 hours apart.  Vitamins and Calcium are available at:   [facility]   [place], [place], [place] [address]                                                www.bariatricadvantage.com                                                              www.celebratevitamins.com  www.procarenow.com      www.amazon.com  The latest research from the [place] of Metabolic and Bariatric Surgery (ASMBS) recommends that patients take at least 3000 IU of Vitamin D3, 12 mg of thiamin, 18-60 mg of iron, and 350-500 mcg of Vitamin B12 to prevent these common deficiencies after surgery. At this time no other complete multivitamins meet these criteria on their own.  __________________________________________________________  Information on my medicine - ELIQUIS (apixaban)  This medication education was reviewed with me or my healthcare representative as part of my discharge preparation.   Why was Eliquis prescribed for you? Eliquis was prescribed for you to reduce the risk of blood clots forming after orthopedic surgery.    What do You need to know about Eliquis? Take your Eliquis TWICE DAILY - one tablet in the morning and one tablet in the evening with or without food.  It would be best to take the dose about the same time each day.  If you have difficulty swallowing the tablet whole please discuss with your pharmacist how to take the medication safely.  Take Eliquis exactly as prescribed by your doctor and DO NOT stop taking Eliquis without talking to the doctor who prescribed the medication.  Stopping without other medication to take the place of Eliquis may increase your risk of developing a clot.  After discharge, you  should have regular check-up appointments with your healthcare provider that is prescribing your Eliquis.  What do you do if you miss a dose? If a dose of ELIQUIS is not taken at the scheduled time, take  it as soon as possible on the same day and twice-daily administration should be resumed.  The dose should not be doubled to make up for a missed dose.  Do not take more than one tablet of ELIQUIS at the same time.  Important Safety Information A possible side effect of Eliquis is bleeding. You should call your healthcare provider right away if you experience any of the following: Bleeding from an injury or your nose that does not stop. Unusual colored urine (red or dark brown) or unusual colored stools (red or black). Unusual bruising for unknown reasons. A serious fall or if you hit your head (even if there is no bleeding).  Some medicines may interact with Eliquis and might increase your risk of bleeding or clotting while on Eliquis. To help avoid this, consult your healthcare provider or pharmacist prior to using any new prescription or non-prescription medications, including herbals, vitamins, non-steroidal anti-inflammatory drugs (NSAIDs) and supplements.  This website has more information on Eliquis (apixaban): http://www.eliquis.com/eliquis/home

## 2021-12-30 NOTE — Op Note (Signed)
OPERATIVE REPORT  SURGEON: Rod Can, MD   ASSISTANT: Sherlean Foot, RNFA  PREOPERATIVE DIAGNOSIS: Displaced Left femoral neck fracture.   POSTOPERATIVE DIAGNOSIS: Displaced Left femoral neck fracture.   PROCEDURE: Left total hip arthroplasty, anterior approach.   IMPLANTS: Biomet Taperloc Reduced Distal stem, size 15x150 mm, high offset. Biomet G7 OsseoTi Cup, size 48 mm. Biomet Vivacit-E liner, size 32 mm, C, +5 neutral. Biomet Biolox ceramic head ball, size 32 - 3 mm. 6.5 mm cancellous bone screw x1  ANESTHESIA:  General  ANTIBIOTICS: 2g ancef.  ESTIMATED BLOOD LOSS:-550 mL    DRAINS: None.  COMPLICATIONS: None   CONDITION: PACU - hemodynamically stable.   BRIEF CLINICAL NOTE: Traci Bird is a 61 y.o. female with a displaced Left femoral neck fracture. The patient was admitted to the hospitalist service and underwent perioperative risk stratification and medical optimization. The risks, benefits, and alternatives to total hip arthroplasty were explained, and the patient elected to proceed.  PROCEDURE IN DETAIL: The patient was taken to the operating room and general anesthesia was induced on the hospital bed.  The patient was then positioned on the Hana table.  All bony prominences were well padded.  The hip was prepped and draped in the normal sterile surgical fashion.  A time-out was called verifying side and site of surgery. Antibiotics were given within 60 minutes of beginning the procedure.   Bikini incision was made, and the direct anterior approach to the hip was performed through the Hueter interval.  Lateral femoral circumflex vessels were treated with the Auqumantys. The anterior capsule was exposed and an inverted T capsulotomy was made.  Fracture hematoma was encountered and evacuated. The patient was found to have a comminuted Left subcapital femoral neck fracture.  I freshened the femoral neck cut with a saw.  I removed the femoral neck fragment.  A  corkscrew was placed into the head and the head was removed.  This was passed to the back table and was measured. The pubofemoral ligament was released subperiosteally to the lesser trochanter.  Acetabular exposure was achieved, and the pulvinar and labrum were excised. Sequential reaming of the acetabulum was then performed up to a size 47 mm reamer under direct visulization. A 48 mm cup was then opened and impacted into place at approximately 40 degrees of abduction and 20 degrees of anteversion. The cup sat deeper than the depth reamed, so I elected to augment the already acceptable press fit fixation with a single 6.5 mm cancellous bone screw.The final polyethylene liner was impacted into place and acetabular osteophytes were removed.    I then gained femoral exposure taking care to protect the abductors and greater trochanter.  This was performed using standard external rotation, extension, and adduction.  A cookie cutter was used to enter the femoral canal, and then the femoral canal finder was placed.  Sequential broaching was performed up to a size 15.  Calcar planer was used on the femoral neck remnant.  I placed a high offset neck and a trial head ball.  The hip was reduced.  Leg lengths and offset were checked fluoroscopically.  The hip was dislocated and trial components were removed.  The final implants were placed, and the hip was reduced.  Fluoroscopy was used to confirm component position and leg lengths.  At 90 degrees of external rotation and full extension, the hip was stable to an anterior directed force.   The wound was copiously irrigated with Irrisept solution and normal saline using pule  lavage.  Marcaine solution was injected into the periarticular soft tissue.  The wound was closed in layers using #1 Stratafix for the fascia, 2-0 Vicryl for the subcutaneous fat, 2-0 Monocryl for the deep dermal layer, and staples + Dermabond for the skin.  Once the glue was fully dried, an Aquacell Ag  dressing was applied.  The patient was transported to the recovery room in stable condition.  Sponge, needle, and instrument counts were correct at the end of the case x2.  The patient tolerated the procedure well and there were no known complications.  Please note that a surgical assistant was a medical necessity for this procedure to perform it in a safe and expeditious manner. Assistant was necessary to provide appropriate retraction of vital neurovascular structures, to prevent femoral fracture, and to allow for anatomic placement of the prosthesis.

## 2021-12-30 NOTE — Anesthesia Preprocedure Evaluation (Addendum)
Anesthesia Evaluation  Patient identified by MRN, date of birth, ID band Patient awake    Reviewed: Allergy & Precautions, NPO status , Patient's Chart, lab work & pertinent test results  History of Anesthesia Complications (+) PONV and history of anesthetic complications  Airway Mallampati: II  TM Distance: >3 FB Neck ROM: Full    Dental no notable dental hx.    Pulmonary neg pulmonary ROS,    Pulmonary exam normal        Cardiovascular negative cardio ROS Normal cardiovascular exam     Neuro/Psych Anxiety Depression negative neurological ROS     GI/Hepatic Neg liver ROS, GERD  ,  Endo/Other  diabetesHypothyroidism   Renal/GU negative Renal ROS  negative genitourinary   Musculoskeletal  (+) Arthritis , LEFT HIP FRACTURE   Abdominal   Peds  Hematology  (+) anemia , Hgb 11.6   Anesthesia Other Findings Day of surgery medications reviewed with patient.  Reproductive/Obstetrics negative OB ROS                            Anesthesia Physical Anesthesia Plan  ASA: 2  Anesthesia Plan: General   Post-op Pain Management: Tylenol PO (pre-op)   Induction: Intravenous  PONV Risk Score and Plan: 4 or greater and Treatment may vary due to age or medical condition, Midazolam, Ondansetron and Dexamethasone  Airway Management Planned: Oral ETT  Additional Equipment: None  Intra-op Plan:   Post-operative Plan: Extubation in OR  Informed Consent: I have reviewed the patients History and Physical, chart, labs and discussed the procedure including the risks, benefits and alternatives for the proposed anesthesia with the patient or authorized representative who has indicated his/her understanding and acceptance.     Dental advisory given  Plan Discussed with: CRNA  Anesthesia Plan Comments:        Anesthesia Quick Evaluation

## 2021-12-30 NOTE — Progress Notes (Signed)
PROGRESS NOTE    Traci Bird  KWI:[id number] DOB: 1961-11-01 DOA: 12/28/2021 PCP: [name], NP    Chief Complaint  Patient presents with   Leg Injury    Brief Narrative:  Patient 61 year old female history of chronic pain being followed at the chronic pain clinic, anxiety/depression, hypothyroidism gout up around 4 AM to get something to drink lost her balance and fell on her left side with inability to bear weight and left hip pain.  Patient presented to the ED plain films of the left hip and pelvis done concerning for left femoral neck fracture.  Orthopedics consulted and patient for surgical repair 12/30/2021.   Assessment & Plan:   Principal Problem:   Closed left hip fracture (HCC) Active Problems:   History of major depression   Hypothyroidism   Normocytic anemia   Acute urinary retention   Chronic pain disorder  #1 impacted and displaced left femoral neck fracture -Secondary to mechanical fall. -Patient with chronic back pain on chronic opioid pain medication. -Plain films of the left hip and pelvis done with moderately impacted acute left femoral neck fracture. -Patient seen in consultation by orthopedics and patient for left hip arthroplasty/repair today, 12/30/2021. -Pain medications adjusted per orthopedics. -Continue current home pain regimen. -Per orthopedics.  2.  Chronic back pain on chronic opioid medications -Patient noted to be in a pain management agreement with Dr Nelva Bush of [facility]. -Continue current home pain regimen of oxycodone 10 mg every 4 hours as needed. -Continue Neurontin twice daily.  -IV Dilaudid dose adjusted per orthopedics and pain being managed per orthopedics. -Continue Senokot-S twice daily, MiraLAX twice daily.   3.  Urinary retention -Foley catheter. -Voiding trial in 3 to 5 days.  4.  Hypothyroidism -Synthroid.  5.  Depression/anxiety -Continue Zoloft, BuSpar as needed, Klonopin as needed.   6.  Normocytic  anemia -Likely secondary to anemia of chronic disease. -Patient with no overt GI bleed. -Check an anemia panel. -Follow H&H. -Transfusion threshold hemoglobin < 7.    DVT prophylaxis: SCDs.  Postop DVT prophylaxis per orthopedics. Code Status: Full Family Communication: Updated patient, husband, daughter at bedside Disposition:   Status is: Inpatient  Remains inpatient appropriate because: Severity of illness       Consultants:  Orthopedics: Dr. Onnie Graham 12/28/2021  Procedures:  Plain films of the left hip and pelvis 12/28/2021 Plain films of the lumbar spine 12/28/2021  Antimicrobials:  None   Subjective: Laying in bed.  Family at bedside.  Stated was supposed to have surgery early this morning by being postponed to later on this afternoon.  No chest pain.  No shortness of breath.  Complaints with pain from the left groin down the left lower extremity.    Objective: Vitals:   12/29/21 1750 12/29/21 2054 12/29/21 2134 12/30/21 0004  BP:  (!) 161/92 (!) 161/95 134/83  Pulse:  80 79 81  Resp: 18 20  20   Temp:  98.9 F (37.2 C)  99.4 F (37.4 C)  TempSrc:  Oral  Oral  SpO2:  100%  90%  Weight:      Height:        Intake/Output Summary (Last 24 hours) at 12/30/2021 1038 Last data filed at 12/30/2021 0649 Gross per 24 hour  Intake --  Output 1700 ml  Net -1700 ml    Filed Weights   12/28/21 0631  Weight: 78.7 kg    Examination:  General exam: NAD Respiratory system: CTA B anterior lung fields.  No wheezes,  no crackles, no rhonchi.  Normal respiratory effort.  Speaking in full sentences.   Cardiovascular system: RRR no murmurs rubs or gallops.  No JVD.  No lower extremity edema.  Gastrointestinal system: Abdomen is soft, nontender, nondistended, positive bowel sounds.  No rebound.  No guarding.   Central nervous system: Alert and oriented. No focal neurological deficits. Extremities: Left hip with tenderness to palpation.  Skin: No rashes, lesions or  ulcers Psychiatry: Judgement and insight appear normal. Mood & affect appropriate.     Data Reviewed: I have personally reviewed following labs and imaging studies  CBC: Recent Labs  Lab 12/28/21 0831 12/29/21 0444 12/30/21 0459  WBC 10.5 7.4 7.6  NEUTROABS 7.3  --  5.4  HGB 10.7* 10.5* 11.6*  HCT 35.2* 34.7* 37.6  MCV 84.8 84.4 83.2  PLT 190 189 196     Basic Metabolic Panel: Recent Labs  Lab 12/28/21 0831 12/29/21 0444 12/30/21 0459  NA 138 137 137  K 3.7 3.8 3.7  CL 107 104 103  CO2 23 28 27   GLUCOSE 89 100* 120*  BUN 18 12 10   CREATININE 0.64 0.69 0.59  CALCIUM 8.2* 8.6* 9.0  MG  --   --  2.0     GFR: Estimated Creatinine Clearance: 78.7 mL/min (by C-G formula based on SCr of 0.59 mg/dL).  Liver Function Tests: Recent Labs  Lab 12/28/21 0831  AST 22  ALT 15  ALKPHOS 77  BILITOT 0.4  PROT 6.4*  ALBUMIN 3.3*     CBG: Recent Labs  Lab 12/28/21 0654 12/28/21 2703  JKKXFG 18 29      Recent Results (from the past 240 hour(s))  Resp Panel by RT-PCR (Flu A&B, Covid) Nasopharyngeal Swab     Status: None   Collection Time: 12/28/21 10:11 AM   Specimen: Nasopharyngeal Swab; Nasopharyngeal(NP) swabs in vial transport medium  Result Value Ref Range Status   SARS Coronavirus 2 by RT PCR NEGATIVE NEGATIVE Final    Comment: (NOTE) SARS-CoV-2 target nucleic acids are NOT DETECTED.  The SARS-CoV-2 RNA is generally detectable in upper respiratory specimens during the acute phase of infection. The lowest concentration of SARS-CoV-2 viral copies this assay can detect is 138 copies/mL. A negative result does not preclude SARS-Cov-2 infection and should not be used as the sole basis for treatment or other patient management decisions. A negative result may occur with  improper specimen collection/handling, submission of specimen other than nasopharyngeal swab, presence of viral mutation(s) within the areas targeted by this assay, and inadequate number  of viral copies(<138 copies/mL). A negative result must be combined with clinical observations, patient history, and epidemiological information. The expected result is Negative.  Fact Sheet for Patients:  EntrepreneurPulse.com.au  Fact Sheet for Healthcare Providers:  IncredibleEmployment.be  This test is no t yet approved or cleared by the [place] FDA and  has been authorized for detection and/or diagnosis of SARS-CoV-2 by FDA under an Emergency Use Authorization (EUA). This EUA will remain  in effect (meaning this test can be used) for the duration of the COVID-19 declaration under Section 564(b)(1) of the Act, 21 U.S.C.section 360bbb-3(b)(1), unless the authorization is terminated  or revoked sooner.       Influenza A by PCR NEGATIVE NEGATIVE Final   Influenza B by PCR NEGATIVE NEGATIVE Final    Comment: (NOTE) The Xpert Xpress SARS-CoV-2/FLU/RSV plus assay is intended as an aid in the diagnosis of influenza from Nasopharyngeal swab specimens and should not be used as a  sole basis for treatment. Nasal washings and aspirates are unacceptable for Xpert Xpress SARS-CoV-2/FLU/RSV testing.  Fact Sheet for Patients: EntrepreneurPulse.com.au  Fact Sheet for Healthcare Providers: IncredibleEmployment.be  This test is not yet approved or cleared by the [place] FDA and has been authorized for detection and/or diagnosis of SARS-CoV-2 by FDA under an Emergency Use Authorization (EUA). This EUA will remain in effect (meaning this test can be used) for the duration of the COVID-19 declaration under Section 564(b)(1) of the Act, 21 U.S.C. section 360bbb-3(b)(1), unless the authorization is terminated or revoked.  Performed at [facility], [place] [address] [place], [place] [address]   Surgical PCR screen     Status: Abnormal   Collection Time: 12/29/21  7:14 PM   Specimen: Nasal  Mucosa; Nasal Swab  Result Value Ref Range Status   MRSA, PCR POSITIVE (A) NEGATIVE Final   Staphylococcus aureus POSITIVE (A) NEGATIVE Final    Comment: (NOTE) The Xpert SA Assay (FDA approved for NASAL specimens in patients 17 years of age and older), is one component of a comprehensive surveillance program. It is not intended to diagnose infection nor to guide or monitor treatment. Performed at [facility], [place] [address] [place], [place] [address]           Radiology Studies: No results found.      Scheduled Meds:  Chlorhexidine Gluconate Cloth  6 each Topical Daily   enoxaparin (LOVENOX) injection  40 mg Subcutaneous Q24H   gabapentin  600 mg Oral BID   levothyroxine  75 mcg Oral Q0600   mupirocin ointment  1 application Nasal BID   polyethylene glycol  17 g Oral BID   senna-docusate  1 tablet Oral BID   sertraline  100 mg Oral QHS   Continuous Infusions:   LOS: 2 days    Time spent: 35 minutes    Irine Seal, MD [facility]   To contact the attending provider between 7A-7P or the covering provider during after hours 7P-7A, please log into the web site [email or web address] and access using universal [place] password for that web site. If you do not have the password, please call the hospital operator.  12/30/2021, 10:38 AM

## 2021-12-30 NOTE — Interval H&P Note (Signed)
History and Physical Interval Note:  12/30/2021 5:01 PM  Traci Bird  has presented today for surgery, with the diagnosis of LEFT HIP FRACTURE.  The various methods of treatment have been discussed with the patient and family. After consideration of risks, benefits and other options for treatment, the patient has consented to  Procedure(s): TOTAL HIP ARTHROPLASTY ANTERIOR APPROACH (Left) as a surgical intervention.  The patient's history has been reviewed, patient examined, no change in status, stable for surgery.  I have reviewed the patient's chart and labs.  Questions were answered to the patient's satisfaction.    The risks, benefits, and alternatives were discussed with the patient. There are risks associated with the surgery including, but not limited to, problems with anesthesia (death), infection, instability (giving out of the joint), dislocation, differences in leg length/angulation/rotation, fracture of bones, loosening or failure of implants, hematoma (blood accumulation) which may require surgical drainage, blood clots, pulmonary embolism, nerve injury (foot drop and lateral thigh numbness), and blood vessel injury. The patient understands these risks and elects to proceed.    Hilton Cork Barnard Sharps

## 2021-12-30 NOTE — Progress Notes (Signed)
Initial Nutrition Assessment  DOCUMENTATION CODES:   Non-severe (moderate) malnutrition in context of chronic illness  INTERVENTION:   Once diet advanced: -Ensure Plus High Protein po BID, each supplement provides 350 kcal and 20 grams of protein.  -Multivitamin with minerals daily  -Will provide vitamin and mineral recommendation handout for patient's with history of bariatric surgery  NUTRITION DIAGNOSIS:   Moderate Malnutrition related to chronic illness (h/o gastric bypass) as evidenced by mild fat depletion, moderate muscle depletion.  GOAL:   Patient will meet greater than or equal to 90% of their needs  MONITOR:   PO intake, Supplement acceptance, Diet advancement, Labs, Weight trends, I & O's  REASON FOR ASSESSMENT:   Consult Hip fracture protocol  ASSESSMENT:   61 year old female history of chronic pain being followed at the chronic pain clinic, anxiety/depression, hypothyroidism gout up around 4 AM to get something to drink lost her balance and fell on her left side with inability to bear weight and left hip pain.  Patient presented to the ED plain films of the left hip and pelvis done concerning for left femoral neck fracture.  Orthopedics consulted and patient for probable surgical repair 12/30/2021  Patient currently in room with family at bedside. Pt is NPO awaiting surgery for her left hip fracture. Pt's daughter reports pt has not been compliant with her vitamin regimen. She had gastric bypass surgery in 2008, daughter feels like her hip fracture is a result of the pt not taking her protein and vitamin supplements.  Pt denies issues swallowing or chewing.   Pt reports she does take daily vitamin D supplements. Is supposed to get Vitamin B-12 shots monthly but had missed some d/t family responsibilities. 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   Encouraged pt to resume her multivitamins, calcium  and she can continue her vitamin D. Will place recommendations in discharge instructions. Pt's daughter states she plans to help pt get back on a regimen.  Pt is agreeable to receiving protein shakes following her surgery.  Per weight records, weight has remained stable.  Medications: Miralax, Senokot, D5 infusion  Labs reviewed: Iron WNL Vitamin B-12 WNL Folate WNL  NUTRITION - FOCUSED PHYSICAL EXAM:  Flowsheet Row Most Recent Value  Orbital Region No depletion  Upper Arm Region Mild depletion  Thoracic and Lumbar Region Mild depletion  Buccal Region Mild depletion  Temple Region Mild depletion  Clavicle Bone Region Mild depletion  Clavicle and Acromion Bone Region Mild depletion  Scapular Bone Region Mild depletion  Dorsal Hand Mild depletion  Patellar Region Moderate depletion  Anterior Thigh Region Moderate depletion  Posterior Calf Region Moderate depletion  Edema (RD Assessment) --  [generalized]  Hair Reviewed  [reports some hair loss]  Eyes Reviewed  Mouth Reviewed  Skin Reviewed       Diet Order:   Diet Order             Diet NPO time specified Except for: Sips with Meds  Diet effective now                   EDUCATION NEEDS:   Education needs have been addressed  Skin:  Skin Assessment: Reviewed RN Assessment  Last BM:  1/20  Height:   Ht Readings from Last 1 Encounters:  12/28/21 5\' 7"  (1.702 m)    Weight:   Wt Readings from Last 1 Encounters:  12/28/21 78.7 kg  BMI:  Body mass index is 27.18 kg/m.  Estimated Nutritional Needs:   Kcal:  1800-2000  Protein:  80-95g  Fluid:  2L/day   Clayton Bibles, MS, RD, LDN Inpatient Clinical Dietitian Contact information available via Amion

## 2021-12-30 NOTE — Progress Notes (Signed)
Realesed cbc and creatinin order at 1900, found out was dc'd 1930, called lab made sure it was not duplicated order.. lab tech came to draw now.  Report given to floor via phone.

## 2021-12-30 NOTE — Progress Notes (Signed)
Received from pacu at 2035 AOX4

## 2021-12-30 NOTE — Transfer of Care (Signed)
Immediate Anesthesia Transfer of Care Note  Patient: Traci Bird  Procedure(s) Performed: TOTAL HIP ARTHROPLASTY ANTERIOR APPROACH (Left: Hip)  Patient Location: PACU  Anesthesia Type:General  Level of Consciousness: awake, alert , oriented and patient cooperative  Airway & Oxygen Therapy: Patient Spontanous Breathing and Patient connected to face mask oxygen  Post-op Assessment: Report given to RN and Post -op Vital signs reviewed and stable  Post vital signs: Reviewed and stable  Last Vitals:  Vitals Value Taken Time  BP 141/86 12/30/21 1920  Temp    Pulse 112 12/30/21 1921  Resp 12 12/30/21 1921  SpO2 95 % 12/30/21 1921  Vitals shown include unvalidated device data.  Last Pain:  Vitals:   12/30/21 1606  TempSrc: Oral  PainSc:       Patients Stated Pain Goal: 4 (19/75/88 3254)  Complications: No notable events documented.

## 2021-12-31 DIAGNOSIS — D62 Acute posthemorrhagic anemia: Secondary | ICD-10-CM

## 2021-12-31 DIAGNOSIS — E440 Moderate protein-calorie malnutrition: Secondary | ICD-10-CM | POA: Insufficient documentation

## 2021-12-31 LAB — BASIC METABOLIC PANEL
Anion gap: 5 (ref 5–15)
BUN: 15 mg/dL (ref 8–23)
CO2: 28 mmol/L (ref 22–32)
Calcium: 8.5 mg/dL — ABNORMAL LOW (ref 8.9–10.3)
Chloride: 102 mmol/L (ref 98–111)
Creatinine, Ser: 0.66 mg/dL (ref 0.44–1.00)
GFR, Estimated: >60 mL/min (ref >60)
Glucose, Bld: 167 mg/dL — ABNORMAL HIGH (ref 70–99)
Potassium: 4.3 mmol/L (ref 3.5–5.1)
Sodium: 135 mmol/L (ref 135–145)

## 2021-12-31 LAB — FOLATE: Folate: 18.8 ng/mL (ref >5.9)

## 2021-12-31 LAB — PREPARE RBC (CROSSMATCH)

## 2021-12-31 LAB — URINE CULTURE: Culture: NO GROWTH

## 2021-12-31 LAB — IRON AND TIBC
Iron: 21 ug/dL — ABNORMAL LOW (ref 28–170)
Saturation Ratios: 5 % — ABNORMAL LOW (ref 10.4–31.8)
TIBC: 398 ug/dL (ref 250–450)
UIBC: 377 ug/dL

## 2021-12-31 LAB — CBC
HCT: 28.7 % — ABNORMAL LOW (ref 36.0–46.0)
Hemoglobin: 8.8 g/dL — ABNORMAL LOW (ref 12.0–15.0)
MCH: 25.6 pg — ABNORMAL LOW (ref 26.0–34.0)
MCHC: 30.7 g/dL (ref 30.0–36.0)
MCV: 83.4 fL (ref 80.0–100.0)
Platelets: 191 10*3/uL (ref 150–400)
RBC: 3.44 MIL/uL — ABNORMAL LOW (ref 3.87–5.11)
RDW: 14.6 % (ref 11.5–15.5)
WBC: 9.6 10*3/uL (ref 4.0–10.5)
nRBC: 0 % (ref 0.0–0.2)

## 2021-12-31 LAB — HEMOGLOBIN AND HEMATOCRIT, BLOOD
HCT: 25.2 % — ABNORMAL LOW (ref 36.0–46.0)
Hemoglobin: 7.7 g/dL — ABNORMAL LOW (ref 12.0–15.0)

## 2021-12-31 LAB — VITAMIN B12: Vitamin B-12: 195 pg/mL (ref 180–914)

## 2021-12-31 LAB — FERRITIN: Ferritin: 35 ng/mL (ref 11–307)

## 2021-12-31 MED ORDER — CHLORHEXIDINE GLUCONATE CLOTH 2 % EX PADS: 6.0000 | MEDICATED_PAD | Freq: Every day | CUTANEOUS | Status: DC

## 2021-12-31 MED ORDER — SODIUM CHLORIDE 0.9 % IV BOLUS
1000.0000 mL | Freq: Once | INTRAVENOUS | Status: AC
  Administered 2021-12-31: 12:00:00 1000 mL via INTRAVENOUS

## 2021-12-31 MED ORDER — SODIUM CHLORIDE 0.9 % IV SOLN: INTRAVENOUS | Status: DC

## 2021-12-31 MED ORDER — ADULT MULTIVITAMIN W/MINERALS CH
1.0000 | ORAL_TABLET | Freq: Two times a day (BID) | ORAL | Status: DC
  Administered 2021-12-31 – 2022-01-02 (×5): 1 via ORAL
  Filled 2021-12-31 (×5): qty 1

## 2021-12-31 MED ORDER — DIPHENHYDRAMINE HCL 25 MG PO CAPS
25.0000 mg | ORAL_CAPSULE | Freq: Once | ORAL | Status: AC
  Administered 2021-12-31: 19:00:00 25 mg via ORAL
  Filled 2021-12-31: qty 1

## 2021-12-31 MED ORDER — ENSURE ENLIVE PO LIQD
237.0000 mL | Freq: Two times a day (BID) | ORAL | Status: DC
  Administered 2021-12-31: 13:00:00 237 mL via ORAL

## 2021-12-31 MED ORDER — SODIUM CHLORIDE 0.9% IV SOLUTION: Freq: Once | INTRAVENOUS | Status: DC

## 2021-12-31 MED ORDER — ACETAMINOPHEN 325 MG PO TABS
650.0000 mg | ORAL_TABLET | Freq: Once | ORAL | Status: AC
  Administered 2021-12-31: 19:00:00 650 mg via ORAL
  Filled 2021-12-31: qty 2

## 2021-12-31 MED ORDER — VITAMIN B-12 1000 MCG PO TABS
1000.0000 ug | ORAL_TABLET | Freq: Every day | ORAL | Status: DC
  Administered 2021-12-31 – 2022-01-02 (×3): 1000 ug via ORAL
  Filled 2021-12-31 (×3): qty 1

## 2021-12-31 NOTE — Evaluation (Signed)
Physical Therapy Evaluation Patient Details Name: Traci Bird MRN: [id number] DOB: 1961/01/07 Today's Date: 12/31/2021  History of Present Illness  61 yo female admitted with L hip fx after falling at home. 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 Hx of falls, DM, L TKA, chronic pain  Clinical Impression  On eval, pt required Min A for mobility. She stood at bedside with RW for a few minutes before c/o dizziness. BP 80/63 once back seated on bed. Sat EOB for a few more minutes before standing again and taking side steps towards HOB with RW. Assisted pt back to bed. Made RN aware of BP. Will follow pt during hospital stay. PT recommendation is for HHPT f/u at this time.      Recommendations for follow up therapy are one component of a multi-disciplinary discharge planning process, led by the attending physician.  Recommendations may be updated based on patient status, additional functional criteria and insurance authorization.  Follow Up Recommendations Home health PT    Assistance Recommended at Discharge Frequent or constant Supervision/Assistance  Patient can return home with the following  A little help with walking and/or transfers;A little help with bathing/dressing/bathroom;Help with stairs or ramp for entrance;Assistance with cooking/housework;Assist for transportation    Equipment Recommendations None recommended by PT  Recommendations for Other Services       Functional Status Assessment Patient has had a recent decline in their functional status and demonstrates the ability to make significant improvements in function in a reasonable and predictable amount of time.     Precautions / Restrictions Precautions Precautions: Fall Precaution Comments: orthostatic? Restrictions Weight Bearing Restrictions: No LLE Weight Bearing: Weight bearing as tolerated      Mobility  Bed Mobility Overal bed mobility: Needs Assistance Bed Mobility: Supine to Sit, Sit to Supine     Supine to sit: Min  guard, HOB elevated Sit to supine: Min assist   General bed mobility comments: Min guard for supine>sit with pt using gait belt as leg lifter. Min A to get L LE back onto bed. Increased time. Cues provided.    Transfers Overall transfer level: Needs assistance Equipment used: Rolling walker (2 wheels) Transfers: Sit to/from Stand Sit to Stand: Min assist, From elevated surface           General transfer comment: Assist to power up, steady, control descent. Cues for safety, technique, hand placement. Pt c/o dizziness-BP 80/63. Returned to sitting before standing again. .    Ambulation/Gait     Assistive device: Rolling walker (2 wheels)         General Gait Details: Side steps along bedside with RW. Cues provided.  Stairs            Wheelchair Mobility    Modified Rankin (Stroke Patients Only)       Balance Overall balance assessment: Needs assistance, History of Falls         Standing balance support: Bilateral upper extremity supported, Reliant on assistive device for balance, During functional activity Standing balance-Leahy Scale: Poor                               Pertinent Vitals/Pain Pain Assessment Pain Assessment: 0-10 Pain Score: 6  Pain Location: L hip Pain Descriptors / Indicators: Discomfort, Sore, Aching Pain Intervention(s): Limited activity within patient's tolerance, Monitored during session, Ice applied, Repositioned    Home Living Family/patient expects to be discharged to:: Private residence Living Arrangements: Spouse/significant other Available  Help at Discharge: Family Type of Home: House Home Access: Stairs to enter;Ramped entrance Entrance Stairs-Rails: Right Entrance Stairs-Number of Steps: 2+1. Also has a ramp   Home Layout: One level Home Equipment: Conservation officer, nature (2 wheels);Rollator (4 wheels);Cane - single point;BSC/3in1 Additional Comments: pt reports she has poor balance    Prior Function Prior Level  of Function : Independent/Modified Independent             Mobility Comments: using a cane for ambulation.       Hand Dominance        Extremity/Trunk Assessment   Upper Extremity Assessment Upper Extremity Assessment: Overall WFL for tasks assessed    Lower Extremity Assessment Lower Extremity Assessment: Generalized weakness    Cervical / Trunk Assessment Cervical / Trunk Assessment: Normal  Communication   Communication: HOH  Cognition Arousal/Alertness: Awake/alert Behavior During Therapy: WFL for tasks assessed/performed Overall Cognitive Status: Within Functional Limits for tasks assessed                                          General Comments      Exercises General Exercises - Lower Extremity Ankle Circles/Pumps: AROM, Both, 10 reps Quad Sets: AROM, Both, 10 reps Heel Slides: AAROM, Left, 10 reps Hip ABduction/ADduction: AAROM, Left, 10 reps   Assessment/Plan    PT Assessment Patient needs continued PT services  PT Problem List Decreased strength;Decreased mobility;Decreased range of motion;Decreased activity tolerance;Decreased balance;Decreased knowledge of use of DME;Pain       PT Treatment Interventions DME instruction;Gait training;Therapeutic activities;Therapeutic exercise;Patient/family education;Functional mobility training;Balance training;Stair training    PT Goals (Current goals can be found in the Care Plan section)  Acute Rehab PT Goals Patient Stated Goal: to regain independence PT Goal Formulation: With patient Time For Goal Achievement: 01/14/22 Potential to Achieve Goals: Good    Frequency 7X/week     Co-evaluation               AM-PAC PT "6 Clicks" Mobility  Outcome Measure Help needed turning from your back to your side while in a flat bed without using bedrails?: A Little Help needed moving from lying on your back to sitting on the side of a flat bed without using bedrails?: A Little Help  needed moving to and from a bed to a chair (including a wheelchair)?: A Little Help needed standing up from a chair using your arms (e.g., wheelchair or bedside chair)?: A Little Help needed to walk in hospital room?: A Little Help needed climbing 3-5 steps with a railing? : A Little 6 Click Score: 18    End of Session Equipment Utilized During Treatment: Gait belt Activity Tolerance: Patient tolerated treatment well (but limited by orthostatic BP) Patient left: in bed;with call bell/phone within reach;with family/visitor present Nurse Communication:  (orthostatic BP) PT Visit Diagnosis: Other abnormalities of gait and mobility (R26.89);Pain;History of falling (Z91.81);Repeated falls (R29.6) Pain - Right/Left: Left Pain - part of body: Hip    Time: 8546-2703 PT Time Calculation (min) (ACUTE ONLY): 33 min   Charges:   PT Evaluation $PT Eval Moderate Complexity: 1 Mod PT Treatments $Therapeutic Activity: 8-22 mins          Doreatha Massed, PT Acute Rehabilitation  Office: [phone] Pager: [phone]

## 2021-12-31 NOTE — Anesthesia Postprocedure Evaluation (Signed)
Anesthesia Post Note  Patient: Traci Bird  Procedure(s) Performed: TOTAL HIP ARTHROPLASTY ANTERIOR APPROACH (Left: Hip)     Patient location during evaluation: PACU Anesthesia Type: General Level of consciousness: awake and alert and oriented Pain management: pain level controlled Vital Signs Assessment: post-procedure vital signs reviewed and stable Respiratory status: spontaneous breathing, nonlabored ventilation and respiratory function stable Cardiovascular status: blood pressure returned to baseline Postop Assessment: no apparent nausea or vomiting Anesthetic complications: no   No notable events documented.               Marthenia Rolling

## 2021-12-31 NOTE — Progress Notes (Signed)
Physical Therapy Treatment Patient Details Name: Traci Bird MRN: [id number] DOB: 22-Dec-1960 Today's Date: 12/31/2021   History of Present Illness 61 yo female admitted with L hip fx after falling at home. 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 Hx of falls, DM, L TKA, chronic pain    PT Comments    Pt's progress has been limited by dizziness. She has been orthostatic on today. Because of this, she has not been able to begin gait training. Will continue to follow and progress activity as safely able.    Recommendations for follow up therapy are one component of a multi-disciplinary discharge planning process, led by the attending physician.  Recommendations may be updated based on patient status, additional functional criteria and insurance authorization.  Follow Up Recommendations  Home health PT     Assistance Recommended at Discharge Frequent or constant Supervision/Assistance  Patient can return home with the following A little help with walking and/or transfers;A little help with bathing/dressing/bathroom;Help with stairs or ramp for entrance;Assistance with cooking/housework;Assist for transportation   Equipment Recommendations  None recommended by PT    Recommendations for Other Services       Precautions / Restrictions Precautions Precautions: Fall Precaution Comments: orthostatic Restrictions Weight Bearing Restrictions: No LLE Weight Bearing: Weight bearing as tolerated     Mobility  Bed Mobility Overal bed mobility: Needs Assistance Bed Mobility: Supine to Sit, Sit to Supine     Supine to sit: Min guard Sit to supine: Min assist   General bed mobility comments: Min guard for supine>sit with pt using gait belt as leg lifter. Min A to get L LE back onto bed. Increased time. Cues provided.    Transfers Overall transfer level: Needs assistance Equipment used: Rolling walker (2 wheels) Transfers: Sit to/from Stand, Bed to chair/wheelchair/BSC Sit to Stand: Min assist, From  elevated surface Stand pivot transfers: Min assist         General transfer comment: Assist to power up, steady, control descent. Cues for safety, technique, hand placement. Pt c/o dizziness after standing from bsc.    Ambulation/Gait Ambulation/Gait assistance: Min assist, +2 safety/equipment   Assistive device: Rolling walker (2 wheels)         General Gait Details: Side steps along bedside with RW. Cues provided. Pt reported dizziness and had to sit down   Stairs             Wheelchair Mobility    Modified Rankin (Stroke Patients Only)       Balance Overall balance assessment: Needs assistance, History of Falls         Standing balance support: Bilateral upper extremity supported, Reliant on assistive device for balance, During functional activity Standing balance-Leahy Scale: Poor                              Cognition Arousal/Alertness: Awake/alert Behavior During Therapy: WFL for tasks assessed/performed Overall Cognitive Status: Within Functional Limits for tasks assessed                                          Exercises General Exercises - Lower Extremity Ankle Circles/Pumps: AROM, Both, 10 reps Quad Sets: AROM, Both, 10 reps Heel Slides: AAROM, Left, 10 reps Hip ABduction/ADduction: AAROM, Left, 10 reps    General Comments        Pertinent Vitals/Pain Pain Assessment Pain  Assessment: 0-10 Pain Score: 6  Pain Location: L hip/thigh Pain Descriptors / Indicators: Discomfort, Sore, Aching Pain Intervention(s): Limited activity within patient's tolerance, Monitored during session, Ice applied, Repositioned    Home Living                          Prior Function            PT Goals (current goals can now be found in the care plan section) Acute Rehab PT Goals Patient Stated Goal: to regain independence PT Goal Formulation: With patient Time For Goal Achievement: 01/14/22 Potential to Achieve  Goals: Good Progress towards PT goals: Progressing toward goals    Frequency    7X/week      PT Plan Current plan remains appropriate    Co-evaluation              AM-PAC PT "6 Clicks" Mobility   Outcome Measure  Help needed turning from your back to your side while in a flat bed without using bedrails?: A Little Help needed moving from lying on your back to sitting on the side of a flat bed without using bedrails?: A Little Help needed moving to and from a bed to a chair (including a wheelchair)?: A Little Help needed standing up from a chair using your arms (e.g., wheelchair or bedside chair)?: A Little Help needed to walk in hospital room?: A Lot Help needed climbing 3-5 steps with a railing? : A Lot 6 Click Score: 16    End of Session Equipment Utilized During Treatment: Gait belt Activity Tolerance: Patient tolerated treatment well (limited by dizziness) Patient left: in bed;with call bell/phone within reach;with family/visitor present Nurse Communication:  (orthostatic BP) PT Visit Diagnosis: Other abnormalities of gait and mobility (R26.89);Pain;History of falling (Z91.81);Repeated falls (R29.6) Pain - Right/Left: Left Pain - part of body: Hip     Time: 2248-2500 PT Time Calculation (min) (ACUTE ONLY): 21 min  Charges:  $Therapeutic Activity: 8-22 mins                         Doreatha Massed, PT Acute Rehabilitation  Office: [phone] Pager: [phone]

## 2021-12-31 NOTE — Progress Notes (Signed)
PROGRESS NOTE    Traci Bird  YIR:[id number] DOB: Mar 22, 1961 DOA: 12/28/2021 PCP: [name], NP    Chief Complaint  Patient presents with   Leg Injury    Brief Narrative:  Patient 61 year old female history of chronic pain being followed at the chronic pain clinic, anxiety/depression, hypothyroidism gout up around 4 AM to get something to drink lost her balance and fell on her left side with inability to bear weight and left hip pain.  Patient presented to the ED plain films of the left hip and pelvis done concerning for left femoral neck fracture.  Orthopedics consulted and patient for surgical repair 12/30/2021.   Assessment & Plan:   Principal Problem:   Closed left hip fracture (HCC) Active Problems:   History of major depression   Hypothyroidism   Normocytic anemia   Acute urinary retention   Chronic pain disorder   Malnutrition of moderate degree  #1 impacted and displaced left femoral neck fracture -Secondary to mechanical fall. -Patient with chronic back pain on chronic opioid pain medication. -Plain films of the left hip and pelvis done with moderately impacted acute left femoral neck fracture. -Patient seen in consultation by orthopedics and patient status post left hip arthroplasty per Dr. Lyla Glassing 12/30/2021.   -Pain medications adjusted per orthopedics. -Continue current home pain regimen. -DVT prophylaxis per orthopedics. -Per orthopedics.  2.  Chronic back pain on chronic opioid medications -Patient noted to be in a pain management agreement with Dr Nelva Bush of [facility]. -Continue current home pain regimen of oxycodone 10 mg every 4 hours as needed. -Continue Neurontin twice daily.  -IV Dilaudid dose adjusted per orthopedics and pain being managed per orthopedics. -Continue Senokot-S twice daily, MiraLAX twice daily.   3.  Urinary retention -Foley catheter placed on admission. -Foley catheter removed per RN and patient with good urine  output.  4.  Hypothyroidism -Continue Synthroid.  5.  Depression/anxiety -Continue Zoloft, BuSpar as needed, Klonopin as needed.    6.  Normocytic anemia/postop acute blood loss anemia -Likely secondary to anemia of chronic disease in the setting of postop acute blood loss anemia. -Patient with no overt GI bleed. -Anemia panel consistent with anemia of chronic disease.   -Hemoglobin at 8.8 this morning.   -Repeat H&H this afternoon was 7.7.   -Due to symptomatic anemia with orthostasis and dizziness we will transfuse 2 units packed red blood cells.   -Follow H&H.    DVT prophylaxis: SCDs.  Postop DVT prophylaxis per orthopedics. Code Status: Full Family Communication: Updated patient, husband, daughter at bedside Disposition:   Status is: Inpatient  Remains inpatient appropriate because: Severity of illness       Consultants:  Orthopedics: Dr. Onnie Graham 12/28/2021  Procedures:  Plain films of the left hip and pelvis 12/28/2021 Plain films of the lumbar spine 12/28/2021 Left total hip arthroplasty per Dr. Lyla Glassing 12/30/2021  Antimicrobials:  None   Subjective: Laying in bed.  Complaining of some dizziness and lightheadedness when standing with physical therapy earlier on today.  Per husband's and RN patient noted to have systolic blood pressures dropped into the 80s and 90s when standing.  No chest pain.  No shortness of breath.  No overt bleeding.   Objective: Vitals:   12/30/21 2015 12/30/21 2030 12/30/21 2040 12/31/21 0517  BP: 124/89 125/86 126/82 111/75  Pulse: 98 97  84  Resp: 11 11 16 17   Temp: 99.1 F (37.3 C)  98.8 F (37.1 C) 98.5 F (36.9 C)  TempSrc:  Oral Oral  SpO2: 98% 99%  93%  Weight:      Height:        Intake/Output Summary (Last 24 hours) at 12/31/2021 1203 Last data filed at 12/30/2021 1920 Gross per 24 hour  Intake 1839.07 ml  Output 1175 ml  Net 664.07 ml    Filed Weights   12/28/21 0631  Weight: 78.7 kg     Examination:  General exam: NAD Respiratory system: Lungs clear to auscultation bilaterally.  No wheezes, no crackles, no rhonchi.  Normal respiratory effort.  Speaking in full sentences.   Cardiovascular system: Regular rate rhythm no murmurs rubs or gallops.  No JVD.  No lower extremity edema.  Gastrointestinal system: Abdomen is soft, nontender, nondistended, positive bowel sounds.  No rebound.  No guarding.    Central nervous system: Alert and oriented. No focal neurological deficits. Extremities: Left hip with tenderness to palpation.  Dressing in place. Skin: No rashes, lesions or ulcers Psychiatry: Judgement and insight appear normal. Mood & affect appropriate.     Data Reviewed: I have personally reviewed following labs and imaging studies  CBC: Recent Labs  Lab 12/28/21 0831 12/29/21 0444 12/30/21 0459 12/30/21 2025 12/31/21 0513  WBC 10.5 7.4 7.6 11.3* 9.6  NEUTROABS 7.3  --  5.4  --   --   HGB 10.7* 10.5* 11.6* 10.2* 8.8*  HCT 35.2* 34.7* 37.6 33.3* 28.7*  MCV 84.8 84.4 83.2 83.9 83.4  PLT 190 189 196 222 191     Basic Metabolic Panel: Recent Labs  Lab 12/28/21 0831 12/29/21 0444 12/30/21 0459 12/30/21 2025 12/31/21 0513  NA 138 137 137  --  135  K 3.7 3.8 3.7  --  4.3  CL 107 104 103  --  102  CO2 23 28 27   --  28  GLUCOSE 89 100* 120*  --  167*  BUN 18 12 10   --  15  CREATININE 0.64 0.69 0.59 0.72 0.66  CALCIUM 8.2* 8.6* 9.0  --  8.5*  MG  --   --  2.0  --   --      GFR: Estimated Creatinine Clearance: 78.7 mL/min (by C-G formula based on SCr of 0.66 mg/dL).  Liver Function Tests: Recent Labs  Lab 12/28/21 0831  AST 22  ALT 15  ALKPHOS 77  BILITOT 0.4  PROT 6.4*  ALBUMIN 3.3*     CBG: Recent Labs  Lab 12/28/21 0654 12/28/21 8299  BZJIRC 78 93      Recent Results (from the past 240 hour(s))  Resp Panel by RT-PCR (Flu A&B, Covid) Nasopharyngeal Swab     Status: None   Collection Time: 12/28/21 10:11 AM   Specimen:  Nasopharyngeal Swab; Nasopharyngeal(NP) swabs in vial transport medium  Result Value Ref Range Status   SARS Coronavirus 2 by RT PCR NEGATIVE NEGATIVE Final    Comment: (NOTE) SARS-CoV-2 target nucleic acids are NOT DETECTED.  The SARS-CoV-2 RNA is generally detectable in upper respiratory specimens during the acute phase of infection. The lowest concentration of SARS-CoV-2 viral copies this assay can detect is 138 copies/mL. A negative result does not preclude SARS-Cov-2 infection and should not be used as the sole basis for treatment or other patient management decisions. A negative result may occur with  improper specimen collection/handling, submission of specimen other than nasopharyngeal swab, presence of viral mutation(s) within the areas targeted by this assay, and inadequate number of viral copies(<138 copies/mL). A negative result must be combined with clinical observations, patient  history, and epidemiological information. The expected result is Negative.  Fact Sheet for Patients:  EntrepreneurPulse.com.au  Fact Sheet for Healthcare Providers:  IncredibleEmployment.be  This test is no t yet approved or cleared by the [place] FDA and  has been authorized for detection and/or diagnosis of SARS-CoV-2 by FDA under an Emergency Use Authorization (EUA). This EUA will remain  in effect (meaning this test can be used) for the duration of the COVID-19 declaration under Section 564(b)(1) of the Act, 21 U.S.C.section 360bbb-3(b)(1), unless the authorization is terminated  or revoked sooner.       Influenza A by PCR NEGATIVE NEGATIVE Final   Influenza B by PCR NEGATIVE NEGATIVE Final    Comment: (NOTE) The Xpert Xpress SARS-CoV-2/FLU/RSV plus assay is intended as an aid in the diagnosis of influenza from Nasopharyngeal swab specimens and should not be used as a sole basis for treatment. Nasal washings and aspirates are unacceptable for  Xpert Xpress SARS-CoV-2/FLU/RSV testing.  Fact Sheet for Patients: EntrepreneurPulse.com.au  Fact Sheet for Healthcare Providers: IncredibleEmployment.be  This test is not yet approved or cleared by the [place] FDA and has been authorized for detection and/or diagnosis of SARS-CoV-2 by FDA under an Emergency Use Authorization (EUA). This EUA will remain in effect (meaning this test can be used) for the duration of the COVID-19 declaration under Section 564(b)(1) of the Act, 21 U.S.C. section 360bbb-3(b)(1), unless the authorization is terminated or revoked.  Performed at [facility], [place] [address], [place], [place] [address]   Surgical PCR screen     Status: Abnormal   Collection Time: 12/29/21  7:14 PM   Specimen: Nasal Mucosa; Nasal Swab  Result Value Ref Range Status   MRSA, PCR POSITIVE (A) NEGATIVE Final   Staphylococcus aureus POSITIVE (A) NEGATIVE Final    Comment: (NOTE) The Xpert SA Assay (FDA approved for NASAL specimens in patients 84 years of age and older), is one component of a comprehensive surveillance program. It is not intended to diagnose infection nor to guide or monitor treatment. Performed at [facility], [place] [address], [place], [place] [address]   Urine Culture     Status: None   Collection Time: 12/30/21  9:20 AM   Specimen: Urine, Catheterized  Result Value Ref Range Status   Specimen Description   Final    URINE, CATHETERIZED Performed at [facility] [address] [place], [place] [address]    Special Requests   Final    NONE Performed at [facility], [place] [address] [place], [place] [address]    Culture   Final    NO GROWTH Performed at [facility], [place] [address], [place], [place] [address]    Report Status 12/31/2021 FINAL  Final          Radiology Studies: Pelvis Portable  Result Date:  12/30/2021 CLINICAL DATA:  Status post left total hip replacement, in PACU. EXAM: PORTABLE PELVIS 1-2 VIEWS COMPARISON:  Preoperative radiograph 12/28/2021 FINDINGS: Left hip arthroplasty in expected alignment. No periprosthetic lucency or fracture. Recent postsurgical change includes air and edema in the soft tissues. Lateral skin staples in place. IMPRESSION: Left hip arthroplasty without immediate postoperative complication. Electronically Signed   By: Keith Rake M.D.   On: 12/30/2021 21:36   DG C-Arm 1-60 Min-No Report  Result Date: 12/30/2021 Fluoroscopy was utilized by the requesting physician.  No radiographic interpretation.   DG C-Arm 1-60 Min-No Report  Result Date: 12/30/2021  Fluoroscopy was utilized by the requesting physician.  No radiographic interpretation.   DG HIP OPERATIVE UNILAT WITH PELVIS LEFT  Result Date: 12/30/2021 CLINICAL DATA:  Hip arthroplasty EXAM: OPERATIVE left HIP (WITH PELVIS IF PERFORMED) 2 VIEWS TECHNIQUE: Fluoroscopic spot image(s) were submitted for interpretation post-operatively. COMPARISON:  12/28/2021 FINDINGS: Total fluoroscopy time was 15 seconds, fluoroscopy dose of 1.56 mGy. The images demonstrate a left hip replacement with intact hardware and normal alignment. IMPRESSION: Intraoperative fluoroscopic assistance provided during left hip replacement. Electronically Signed   By: Donavan Foil M.D.   On: 12/30/2021 19:48        Scheduled Meds:  Chlorhexidine Gluconate Cloth  6 each Topical Daily   docusate sodium  100 mg Oral BID   enoxaparin (LOVENOX) injection  40 mg Subcutaneous Q24H   feeding supplement  237 mL Oral BID BM   gabapentin  600 mg Oral BID   levothyroxine  75 mcg Oral Q0600   multivitamin with minerals  1 tablet Oral BID   mupirocin ointment  1 application Nasal BID   polyethylene glycol  17 g Oral BID   senna  1 tablet Oral BID   sertraline  100 mg Oral QHS   vitamin B-12  1,000 mcg Oral Daily   Continuous  Infusions:   LOS: 3 days    Time spent: 35 minutes    Irine Seal, MD [facility]   To contact the attending provider between 7A-7P or the covering provider during after hours 7P-7A, please log into the web site [email or web address] and access using universal [place] password for that web site. If you do not have the password, please call the hospital operator.  12/31/2021, 12:03 PM

## 2022-01-01 LAB — TYPE AND SCREEN
ABO/RH(D): A POS
Antibody Screen: NEGATIVE
Unit division: 0
Unit division: 0

## 2022-01-01 LAB — CBC
HCT: 29.6 % — ABNORMAL LOW (ref 36.0–46.0)
Hemoglobin: 9.3 g/dL — ABNORMAL LOW (ref 12.0–15.0)
MCH: 25.9 pg — ABNORMAL LOW (ref 26.0–34.0)
MCHC: 31.4 g/dL (ref 30.0–36.0)
MCV: 82.5 fL (ref 80.0–100.0)
Platelets: 178 10*3/uL (ref 150–400)
RBC: 3.59 MIL/uL — ABNORMAL LOW (ref 3.87–5.11)
RDW: 15.4 % (ref 11.5–15.5)
WBC: 9.5 10*3/uL (ref 4.0–10.5)
nRBC: 0 % (ref 0.0–0.2)

## 2022-01-01 LAB — BASIC METABOLIC PANEL
Anion gap: 5 (ref 5–15)
BUN: 20 mg/dL (ref 8–23)
CO2: 29 mmol/L (ref 22–32)
Calcium: 8 mg/dL — ABNORMAL LOW (ref 8.9–10.3)
Chloride: 106 mmol/L (ref 98–111)
Creatinine, Ser: 0.59 mg/dL (ref 0.44–1.00)
GFR, Estimated: >60 mL/min (ref >60)
Glucose, Bld: 113 mg/dL — ABNORMAL HIGH (ref 70–99)
Potassium: 4 mmol/L (ref 3.5–5.1)
Sodium: 140 mmol/L (ref 135–145)

## 2022-01-01 LAB — BPAM RBC
Blood Product Expiration Date: 202301262359
Blood Product Expiration Date: 202301272359
ISSUE DATE / TIME: 202301241928
ISSUE DATE / TIME: 202301242237
Unit Type and Rh: 6200
Unit Type and Rh: 6200

## 2022-01-01 MED ORDER — HYDROMORPHONE HCL 2 MG PO TABS: 1.0000 mg | ORAL_TABLET | ORAL | 0 refills | Status: AC | PRN

## 2022-01-01 MED ORDER — APIXABAN 2.5 MG PO TABS: 2.5000 mg | ORAL_TABLET | Freq: Two times a day (BID) | ORAL | 0 refills | Status: AC

## 2022-01-01 MED ORDER — HYDROMORPHONE HCL 2 MG PO TABS
1.0000 mg | ORAL_TABLET | ORAL | Status: DC | PRN
Start: 2022-01-01 — End: 2022-01-02
  Administered 2022-01-01 – 2022-01-02 (×3): 1 mg via ORAL
  Filled 2022-01-01 (×3): qty 1

## 2022-01-01 NOTE — Progress Notes (Addendum)
Physical Therapy Treatment Patient Details Name: Traci Bird MRN: [id number] DOB: 06/26/1961 Today's Date: 01/01/2022   History of Present Illness 61 yo female admitted with L hip fx after falling at home. 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 Hx of falls, DM, L TKA, chronic pain    PT Comments    Progressing well. Discussed d/c plan-pt will return home where she lives with her husband. She prefers to go to OP PT for f/u therapy so I have updated PT recommendation. Will continue to follow and progress activity as tolerated.    Recommendations for follow up therapy are one component of a multi-disciplinary discharge planning process, led by the attending physician.  Recommendations may be updated based on patient status, additional functional criteria and insurance authorization.  Follow Up Recommendations  Outpatient PT     Assistance Recommended at Discharge Frequent or constant Supervision/Assistance  Patient can return home with the following A little help with walking and/or transfers;A little help with bathing/dressing/bathroom;Help with stairs or ramp for entrance;Assistance with cooking/housework;Assist for transportation   Equipment Recommendations  None recommended by PT    Recommendations for Other Services       Precautions / Restrictions Precautions Precautions: Fall Restrictions Weight Bearing Restrictions: No LLE Weight Bearing: Weight bearing as tolerated     Mobility  Bed Mobility Overal bed mobility: Needs Assistance Bed Mobility: Supine to Sit, Sit to Supine     Supine to sit: HOB elevated, Supervision Sit to supine: HOB elevated, Supervision   General bed mobility comments: Supv for safety. Increased time. Pt used gait belt as leg lifter.    Transfers Overall transfer level: Needs assistance Equipment used: Rolling walker (2 wheels) Transfers: Sit to/from Stand Sit to Stand: Min guard, From elevated surface           General transfer comment: Min guard  for safety. Cues for hand placement.    Ambulation/Gait Ambulation/Gait assistance: Min assist Gait Distance (Feet): 75 Feet Assistive device: Rolling walker (2 wheels) Gait Pattern/deviations: Step-through pattern, Decreased stride length       General Gait Details: Intermittent assist to steady. Pt tolerated increased distance well. Did not have to use recliner this session.   Stairs             Wheelchair Mobility    Modified Rankin (Stroke Patients Only)       Balance Overall balance assessment: Needs assistance, History of Falls         Standing balance support: Bilateral upper extremity supported, Reliant on assistive device for balance, During functional activity Standing balance-Leahy Scale: Poor                              Cognition Arousal/Alertness: Awake/alert Behavior During Therapy: WFL for tasks assessed/performed Overall Cognitive Status: Within Functional Limits for tasks assessed                                 General Comments: HOH        Exercises Total Joint Exercises Ankle Circles/Pumps: AROM, Both, 10 reps Quad Sets: AROM, Both, 10 reps Heel Slides: AAROM, Left, 10 reps Hip ABduction/ADduction: AAROM, Left, 10 reps    General Comments        Pertinent Vitals/Pain Pain Assessment Pain Assessment: Faces Faces Pain Scale: Hurts little more Pain Location: L hip/thigh Pain Descriptors / Indicators: Discomfort, Sore, Aching Pain Intervention(s): Monitored during  session, Repositioned    Home Living                          Prior Function            PT Goals (current goals can now be found in the care plan section) Progress towards PT goals: Progressing toward goals    Frequency    7X/week      PT Plan Discharge plan needs to be updated    Co-evaluation              AM-PAC PT "6 Clicks" Mobility   Outcome Measure  Help needed turning from your back to your side while  in a flat bed without using bedrails?: A Little Help needed moving from lying on your back to sitting on the side of a flat bed without using bedrails?: A Little Help needed moving to and from a bed to a chair (including a wheelchair)?: A Little Help needed standing up from a chair using your arms (e.g., wheelchair or bedside chair)?: A Little Help needed to walk in hospital room?: A Little Help needed climbing 3-5 steps with a railing? : A Little 6 Click Score: 18    End of Session Equipment Utilized During Treatment: Gait belt Activity Tolerance: Patient tolerated treatment well Patient left: in bed;with call bell/phone within reach;with family/visitor present   PT Visit Diagnosis: Other abnormalities of gait and mobility (R26.89);Pain;History of falling (Z91.81);Repeated falls (R29.6) Pain - Right/Left: Left Pain - part of body: Hip     Time: 8563-1497 PT Time Calculation (min) (ACUTE ONLY): 15 min  Charges:  $Gait Training: 8-22 mins $Therapeutic Exercise: 8-22 mins                        Doreatha Massed, PT Acute Rehabilitation  Office: [phone] Pager: [phone]

## 2022-01-01 NOTE — Evaluation (Signed)
Occupational Therapy Evaluation Patient Details Name: Traci Bird MRN: [id number] DOB: 1961-10-09 Today's Date: 01/01/2022   History of Present Illness 61 yo female admitted with L hip fx after falling at home. 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 Hx of falls, DM, L TKA, chronic pain   Clinical Impression   Patient is a pleasant 61 year old female who was admitted for above. Currently, patient is min A for transfers with increased time and noted self limiting WB during transfers with RW. Patient was mod A for LB dressing, min A for transfers and noted to have decreased functional activity tolerance. Patient would continue to benefit from skilled OT services at this time while admitted and after d/c to address noted deficits in order to improve overall safety and independence in ADLs.        Recommendations for follow up therapy are one component of a multi-disciplinary discharge planning process, led by the attending physician.  Recommendations may be updated based on patient status, additional functional criteria and insurance authorization.   Follow Up Recommendations  Home health OT    Assistance Recommended at Discharge Frequent or constant Supervision/Assistance  Patient can return home with the following A little help with walking and/or transfers;A little help with bathing/dressing/bathroom;Assistance with cooking/housework;Direct supervision/assist for financial management;Direct supervision/assist for medications management;Help with stairs or ramp for entrance    Functional Status Assessment  Patient has had a recent decline in their functional status and demonstrates the ability to make significant improvements in function in a reasonable and predictable amount of time.  Equipment Recommendations  Other (comment) (has all recommended items at home)    Recommendations for Other Services       Precautions / Restrictions Precautions Precautions: Fall Precaution Comments:  orthostatic Restrictions Weight Bearing Restrictions: No LLE Weight Bearing: Weight bearing as tolerated      Mobility Bed Mobility Overal bed mobility: Needs Assistance Bed Mobility: Supine to Sit, Sit to Supine     Supine to sit: HOB elevated, Min assist     General bed mobility comments: patient neeed physical assist to advance LLE to edge of bed with increased time.    Transfers                          Balance Overall balance assessment: Needs assistance, History of Falls Sitting-balance support: Feet supported, No upper extremity supported Sitting balance-Leahy Scale: Good     Standing balance support: Bilateral upper extremity supported, Reliant on assistive device for balance, During functional activity Standing balance-Leahy Scale: Poor                             ADL either performed or assessed with clinical judgement   ADL Overall ADL's : Needs assistance/impaired Eating/Feeding: Set up;Sitting   Grooming: Wash/dry face;Wash/dry hands;Sitting Grooming Details (indicate cue type and reason): EOB Upper Body Bathing: Set up;Sitting Upper Body Bathing Details (indicate cue type and reason): EOB Lower Body Bathing: Moderate assistance;Sit to/from stand;Sitting/lateral leans   Upper Body Dressing : Set up;Sitting   Lower Body Dressing: Sit to/from stand;Sitting/lateral leans;Moderate assistance   Toilet Transfer: Minimal assistance;BSC/3in1;Rolling walker (2 wheels) Toilet Transfer Details (indicate cue type and reason): patient was able to transfer to and from 3 in 1 commode at bedside with RW Toileting- Clothing Manipulation and Hygiene: Sit to/from stand;Sitting/lateral lean;Minimal assistance               Vision  Baseline Vision/History: 1 Wears glasses Patient Visual Report: No change from baseline       Perception     Praxis      Pertinent Vitals/Pain Pain Assessment Pain Assessment: Faces Faces Pain Scale: Hurts  even more Pain Location: L hip/thigh Pain Descriptors / Indicators: Discomfort, Sore, Aching Pain Intervention(s): Limited activity within patient's tolerance, Monitored during session, Premedicated before session     Hand Dominance Right   Extremity/Trunk Assessment Upper Extremity Assessment Upper Extremity Assessment: Overall WFL for tasks assessed   Lower Extremity Assessment Lower Extremity Assessment: Defer to PT evaluation   Cervical / Trunk Assessment Cervical / Trunk Assessment: Normal   Communication Communication Communication: HOH   Cognition Arousal/Alertness: Awake/alert Behavior During Therapy: WFL for tasks assessed/performed Overall Cognitive Status: Within Functional Limits for tasks assessed                                 General Comments: HOH     General Comments       Exercises     Shoulder Instructions      Home Living Family/patient expects to be discharged to:: Private residence Living Arrangements: Spouse/significant other Available Help at Discharge: Family Type of Home: House Home Access: Stairs to enter;Ramped entrance Entrance Stairs-Number of Steps: 2+1. Also has a ramp Entrance Stairs-Rails: Right Home Layout: One level     Bathroom Shower/Tub: Occupational psychologist: Standard Bathroom Accessibility: No   Home Equipment: Conservation officer, nature (2 wheels);Rollator (4 wheels);Cane - single point;BSC/3in1          Prior Functioning/Environment Prior Level of Function : Independent/Modified Independent             Mobility Comments: using a cane for ambulation. ADLs Comments: patient reported having AE for LB Dressing at home and being well versed in how to use it        OT Problem List: Impaired balance (sitting and/or standing);Decreased safety awareness;Decreased knowledge of precautions;Decreased knowledge of use of DME or AE;Cardiopulmonary status limiting activity      OT  Treatment/Interventions: Self-care/ADL training;Therapeutic exercise;DME and/or AE instruction;Therapeutic activities;Balance training;Patient/family education    OT Goals(Current goals can be found in the care plan section) Acute Rehab OT Goals Patient Stated Goal: to get back home with husband and dogs OT Goal Formulation: With patient Time For Goal Achievement: 01/15/22 Potential to Achieve Goals: Good  OT Frequency: Min 2X/week    Co-evaluation              AM-PAC OT "6 Clicks" Daily Activity     Outcome Measure Help from another person eating meals?: None Help from another person taking care of personal grooming?: A Little Help from another person toileting, which includes using toliet, bedpan, or urinal?: A Little Help from another person bathing (including washing, rinsing, drying)?: A Lot Help from another person to put on and taking off regular upper body clothing?: A Little Help from another person to put on and taking off regular lower body clothing?: A Lot 6 Click Score: 17   End of Session Equipment Utilized During Treatment: Gait belt;Rolling walker (2 wheels) Nurse Communication: Mobility status  Activity Tolerance: Patient tolerated treatment well Patient left: in chair;with call bell/phone within reach;with chair alarm set  OT Visit Diagnosis: Unsteadiness on feet (R26.81);Muscle weakness (generalized) (M62.81);Pain                Time: 9798-9211 OT Time Calculation (  min): 32 min Charges:  OT General Charges $OT Visit: 1 Visit OT Evaluation $OT Eval Low Complexity: 1 Low OT Treatments $Self Care/Home Management : 8-22 mins  Jackelyn Poling OTR/L, MS Acute Rehabilitation Department Office# [phone] Pager# [phone]   Marcellina Millin 01/01/2022, 9:05 AM

## 2022-01-01 NOTE — TOC Initial Note (Signed)
Transition of Care [facility]) - Initial/Assessment Note    Patient Details  Name: Traci Bird MRN: [id number] Date of Birth: October 07, 1961  Transition of [facility]) CM/SW Contact:    Dessa Phi, RN Phone Number: 01/01/2022, 3:48 PM  Clinical Narrative: Spoke to patient about d/c plans-prefer otpt PT-used [facility] in past-CM contacted Marja Kays will contact office to put in f/u appt to contact patient for otpt PT. No further CM needs.                    Barriers to Discharge: Continued Medical Work up   Patient Goals and CMS Choice Patient states their goals for this hospitalization and ongoing recovery are:: home   Choice offered to / list presented to : Patient  Expected Discharge Plan and Services     Discharge Planning Services: CM Consult   Living arrangements for the past 2 months: Single Family Home                                      Prior Living Arrangements/Services Living arrangements for the past 2 months: Single Family Home Lives with:: Spouse   Do you feel safe going back to the place where you live?: Yes          Current home services: DME (cane, rw)    Activities of Daily Living Home Assistive Devices/Equipment: Cane (specify quad or straight), Walker (specify type) ADL Screening (condition at time of admission) Patient's cognitive ability adequate to safely complete daily activities?: Yes Is the patient deaf or have difficulty hearing?: Yes Does the patient have difficulty seeing, even when wearing glasses/contacts?: No Does the patient have difficulty concentrating, remembering, or making decisions?: No Patient able to express need for assistance with ADLs?: Yes Does the patient have difficulty dressing or bathing?: No Independently performs ADLs?: Yes (appropriate for developmental age) Does the patient have difficulty walking or climbing stairs?: No Weakness of Legs: None Weakness of Arms/Hands: None  Permission  Sought/Granted Permission sought to share information with : Case Manager Permission granted to share information with : Yes, Verbal Permission Granted              Emotional Assessment              Admission diagnosis:  Pain [R52] Closed left hip fracture ([place]) [S72.002A] Patient Active Problem List   Diagnosis Date Noted   Malnutrition of moderate degree 12/31/2021   Hypothyroidism 12/29/2021   Normocytic anemia 12/29/2021   Acute urinary retention 12/29/2021   Chronic pain disorder 12/29/2021   Closed left hip fracture ([place]) 12/28/2021   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   Failed total knee, left ([place]) 07/05/2020   Status post arthroscopy of right knee 04/16/2020   Status post left knee replacement 04/16/2020   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

## 2022-01-01 NOTE — Progress Notes (Signed)
Physical Therapy Treatment Patient Details Name: Traci Bird MRN: [id number] DOB: 02-Dec-1961 Today's Date: 01/01/2022   History of Present Illness 61 yo female admitted with L hip fx after falling at home. 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 Hx of falls, DM, L TKA, chronic pain    PT Comments    Progressing with mobility. Seated rest break needed after 25 feet. Pt walked another 35 feet after resting. O2 98%, BP 115/72 after first walk. Some fatigue and lightheadedness reported but no dizziness like yesterday.   Recommendations for follow up therapy are one component of a multi-disciplinary discharge planning process, led by the attending physician.  Recommendations may be updated based on patient status, additional functional criteria and insurance authorization.  Follow Up Recommendations  Home health PT     Assistance Recommended at Discharge Frequent or constant Supervision/Assistance  Patient can return home with the following A little help with walking and/or transfers;A little help with bathing/dressing/bathroom;Help with stairs or ramp for entrance;Assistance with cooking/housework;Assist for transportation   Equipment Recommendations  None recommended by PT    Recommendations for Other Services       Precautions / Restrictions Precautions Precautions: Fall Precaution Comments: monitor vitals as needed Restrictions Weight Bearing Restrictions: No LLE Weight Bearing: Weight bearing as tolerated     Mobility  Bed Mobility Overal bed mobility: Needs Assistance         Sit to supine: Min assist   General bed mobility comments: Pt used gait belt as leg lifter. Needed a little help getting LE onto bed. Increased time. Cues provided.    Transfers Overall transfer level: Needs assistance Equipment used: Rolling walker (2 wheels) Transfers: Sit to/from Stand Sit to Stand: Min assist           General transfer comment: Assist to power up, steady, control descent. Cues for  safety, technique, hand placement.    Ambulation/Gait Ambulation/Gait assistance: Min assist Gait Distance (Feet): 35 Feet (25'x1) Assistive device: Rolling walker (2 wheels) Gait Pattern/deviations: Step-to pattern, Step-through pattern, Decreased stride length       General Gait Details: Cues for safety, sequence initially however pt had difficulty advancing L LE first in sequence so allowed her to change up pattern. Followed with recliner. Seated rest break needed after 25 feet. Pt walked another 35 feet after that. O2 98%, BP 115/72 after first walk. Some fatigue and lightheadedness reported but no dizziness like yesterday   Stairs             Wheelchair Mobility    Modified Rankin (Stroke Patients Only)       Balance Overall balance assessment: Needs assistance, History of Falls         Standing balance support: Bilateral upper extremity supported, Reliant on assistive device for balance, During functional activity Standing balance-Leahy Scale: Poor                              Cognition Arousal/Alertness: Awake/alert Behavior During Therapy: WFL for tasks assessed/performed Overall Cognitive Status: Within Functional Limits for tasks assessed                                 General Comments: HOH        Exercises Total Joint Exercises Ankle Circles/Pumps: AROM, Both, 10 reps Quad Sets: AROM, Both, 10 reps Heel Slides: AAROM, Left, 10 reps Hip ABduction/ADduction: AAROM, Left, 10  reps    General Comments        Pertinent Vitals/Pain Pain Assessment Pain Assessment: Faces Faces Pain Scale: Hurts even more Pain Location: L hip/thigh Pain Descriptors / Indicators: Discomfort, Sore, Aching Pain Intervention(s): Limited activity within patient's tolerance, Monitored during session, Ice applied, Repositioned    Home Living Family/patient expects to be discharged to:: Private residence Living Arrangements: Spouse/significant  other Available Help at Discharge: Family Type of Home: House Home Access: Stairs to enter;Ramped entrance Entrance Stairs-Rails: Right Entrance Stairs-Number of Steps: 2+1. Also has a ramp   Home Layout: One level Home Equipment: Conservation officer, nature (2 wheels);Rollator (4 wheels);Cane - single point;BSC/3in1      Prior Function            PT Goals (current goals can now be found in the care plan section) Progress towards PT goals: Progressing toward goals    Frequency    7X/week      PT Plan Current plan remains appropriate    Co-evaluation              AM-PAC PT "6 Clicks" Mobility   Outcome Measure  Help needed turning from your back to your side while in a flat bed without using bedrails?: A Little Help needed moving from lying on your back to sitting on the side of a flat bed without using bedrails?: A Little Help needed moving to and from a bed to a chair (including a wheelchair)?: A Little Help needed standing up from a chair using your arms (e.g., wheelchair or bedside chair)?: A Little Help needed to walk in hospital room?: A Little Help needed climbing 3-5 steps with a railing? : A Lot 6 Click Score: 17    End of Session Equipment Utilized During Treatment: Gait belt Activity Tolerance: Patient tolerated treatment well Patient left: in bed;with call bell/phone within reach;with family/visitor present   PT Visit Diagnosis: Other abnormalities of gait and mobility (R26.89);Pain;History of falling (Z91.81);Repeated falls (R29.6) Pain - Right/Left: Left Pain - part of body: Hip     Time: 0912-0942 PT Time Calculation (min) (ACUTE ONLY): 30 min  Charges:  $Gait Training: 8-22 mins $Therapeutic Exercise: 8-22 mins                        Doreatha Massed, PT Acute Rehabilitation  Office: [phone] Pager: [phone]

## 2022-01-01 NOTE — Progress Notes (Signed)
PROGRESS NOTE    Traci Bird  PXT:[id number] DOB: May 20, 1961 DOA: 12/28/2021 PCP: [name], NP   Brief Narrative: 61 year old female history of chronic pain being followed at the chronic pain clinic, anxiety/depression, hypothyroidism gout up around 4 AM to get something to drink lost her balance and fell on her left side with inability to bear weight and left hip pain.  Patient presented to the ED plain films of the left hip and pelvis done concerning for left femoral neck fracture.  Orthopedics consulted and patient for surgical repair 12/30/2021.    Assessment & Plan:   Principal Problem:   Closed left hip fracture (HCC) Active Problems:   History of major depression   Hypothyroidism   Normocytic anemia   Acute urinary retention   Chronic pain disorder   Malnutrition of moderate degree   #1 impacted and displaced left femoral neck fracture -Secondary to mechanical fall. -Patient with chronic back pain on chronic opioid pain medication. -Plain films of the left hip and pelvis done with moderately impacted acute left femoral neck fracture. -Patient seen in consultation by orthopedics and patient status post left hip arthroplasty per Dr. Lyla Glassing 12/30/2021.   -Pain medications adjusted per orthopedics. -Continue current home pain regimen. -DVT prophylaxis per orthopedics.  2.  Chronic back pain on chronic opioid medications -Patient noted to be in a pain management agreement with Dr Nelva Bush [facility]. -Continue current home pain regimen of oxycodone 10 mg every 4 hours as needed. -Continue Neurontin twice daily.  -IV Dilaudid dose adjusted per orthopedics and pain being managed per orthopedics. -Continue Senokot-S twice daily, MiraLAX twice daily.   3.  Urinary retention -Foley catheter placed on admission. -Foley catheter removed per RN and patient with good urine output.  4.  Hypothyroidism -Continue Synthroid.  5.  Depression/anxiety -Continue Zoloft,  BuSpar as needed, Klonopin as needed.    6.  Normocytic anemia/postop acute blood loss anemia- -Likely secondary to anemia of chronic disease in the setting of postop acute blood loss anemia. -Patient with no overt GI bleed. -Anemia panel consistent with anemia of chronic disease.   -Hemoglobin 9.3 from from 7.7   transfused 2 units packed red blood cells.      Nutrition Problem: Moderate Malnutrition Etiology: chronic illness (h/o gastric bypass)  Signs/Symptoms: mild fat depletion, moderate muscle depletion   Interventions: Ensure Enlive (each supplement provides 350kcal and 20 grams of protein), MVI, Education  Estimated body mass index is 27.18 kg/m as calculated from the following:   Height as of this encounter: 5\' 7"  (1.702 m).   Weight as of this encounter: 78.7 kg.  DVT prophylaxis:lovenox Code Status: full Family Communication: none at bed side Disposition Plan:  Status is: Inpatient  Remains inpatient appropriate because: hip fracture  Consultants:  ortho  Procedures: left hip surgery Antimicrobials: none   Subjective:  C/o left thigh pain C/o dizzy Bp soft Had a bm yesterday Objective: Vitals:   01/01/22 1047 01/01/22 1048 01/01/22 1051 01/01/22 1337  BP: 123/74 113/79 117/80 124/70  Pulse: 87 96 100 86  Resp:    20  Temp:    97.8 F (36.6 C)  TempSrc:    Oral  SpO2: 91% 93% 92% 93%  Weight:      Height:        Intake/Output Summary (Last 24 hours) at 01/01/2022 1512 Last data filed at 01/01/2022 0500 Gross per 24 hour  Intake 3056.11 ml  Output 450 ml  Net 2606.11 ml  Filed Weights   12/28/21 0631  Weight: 78.7 kg    Examination:  General exam: Appears calm and comfortable  Respiratory system: Clear to auscultation. Respiratory effort normal. Cardiovascular system: S1 & S2 heard, RRR. No JVD, murmurs, rubs, gallops or clicks. No pedal edema. Gastrointestinal system: Abdomen is nondistended, soft and nontender. No organomegaly or  masses felt. Normal bowel sounds heard. Central nervous system: Alert and oriented. No focal neurological deficits. Extremities: left hip incision covered with dressing Skin: No rashes, lesions or ulcers Psychiatry: Judgement and insight appear normal. Mood & affect appropriate.     Data Reviewed: I have personally reviewed following labs and imaging studies  CBC: Recent Labs  Lab 12/28/21 0831 12/29/21 0444 12/30/21 0459 12/30/21 2025 12/31/21 0513 12/31/21 1439 01/01/22 0454  WBC 10.5 7.4 7.6 11.3* 9.6  --  9.5  NEUTROABS 7.3  --  5.4  --   --   --   --   HGB 10.7* 10.5* 11.6* 10.2* 8.8* 7.7* 9.3*  HCT 35.2* 34.7* 37.6 33.3* 28.7* 25.2* 29.6*  MCV 84.8 84.4 83.2 83.9 83.4  --  82.5  PLT 190 189 196 222 191  --  606   Basic Metabolic Panel: Recent Labs  Lab 12/28/21 0831 12/29/21 0444 12/30/21 0459 12/30/21 2025 12/31/21 0513 01/01/22 0454  NA 138 137 137  --  135 140  K 3.7 3.8 3.7  --  4.3 4.0  CL 107 104 103  --  102 106  CO2 23 28 27   --  28 29  GLUCOSE 89 100* 120*  --  167* 113*  BUN 18 12 10   --  15 20  CREATININE 0.64 0.69 0.59 0.72 0.66 0.59  CALCIUM 8.2* 8.6* 9.0  --  8.5* 8.0*  MG  --   --  2.0  --   --   --    GFR: Estimated Creatinine Clearance: 78.7 mL/min (by C-G formula based on SCr of 0.59 mg/dL). Liver Function Tests: Recent Labs  Lab 12/28/21 0831  AST 22  ALT 15  ALKPHOS 77  BILITOT 0.4  PROT 6.4*  ALBUMIN 3.3*   No results for input(s): LIPASE, AMYLASE in the last 168 hours. No results for input(s): AMMONIA in the last 168 hours. Coagulation Profile: No results for input(s): INR, PROTIME in the last 168 hours. Cardiac Enzymes: No results for input(s): CKTOTAL, CKMB, CKMBINDEX, TROPONINI in the last 168 hours. BNP (last 3 results) No results for input(s): PROBNP in the last 8760 hours. HbA1C: No results for input(s): HGBA1C in the last 72 hours. CBG: Recent Labs  Lab 12/28/21 0654 12/28/21 0657  GLUCAP 73 77   Lipid  Profile: No results for input(s): CHOL, HDL, LDLCALC, TRIG, CHOLHDL, LDLDIRECT in the last 72 hours. Thyroid Function Tests: No results for input(s): TSH, T4TOTAL, FREET4, T3FREE, THYROIDAB in the last 72 hours. Anemia Panel: Recent Labs    12/30/21 0459 12/31/21 0513  VITAMINB12 197 195  FOLATE 18.5 18.8  FERRITIN 28 35  TIBC 464* 398  IRON 47 21*   Sepsis Labs: No results for input(s): PROCALCITON, LATICACIDVEN in the last 168 hours.  Recent Results (from the past 240 hour(s))  Resp Panel by RT-PCR (Flu A&B, Covid) Nasopharyngeal Swab     Status: None   Collection Time: 12/28/21 10:11 AM   Specimen: Nasopharyngeal Swab; Nasopharyngeal(NP) swabs in vial transport medium  Result Value Ref Range Status   SARS Coronavirus 2 by RT PCR NEGATIVE NEGATIVE Final    Comment: (NOTE)  SARS-CoV-2 target nucleic acids are NOT DETECTED.  The SARS-CoV-2 RNA is generally detectable in upper respiratory specimens during the acute phase of infection. The lowest concentration of SARS-CoV-2 viral copies this assay can detect is 138 copies/mL. A negative result does not preclude SARS-Cov-2 infection and should not be used as the sole basis for treatment or other patient management decisions. A negative result may occur with  improper specimen collection/handling, submission of specimen other than nasopharyngeal swab, presence of viral mutation(s) within the areas targeted by this assay, and inadequate number of viral copies(<138 copies/mL). A negative result must be combined with clinical observations, patient history, and epidemiological information. The expected result is Negative.  Fact Sheet for Patients:  EntrepreneurPulse.com.au  Fact Sheet for Healthcare Providers:  IncredibleEmployment.be  This test is no t yet approved or cleared by the [place] FDA and  has been authorized for detection and/or diagnosis of SARS-CoV-2 by FDA under an  Emergency Use Authorization (EUA). This EUA will remain  in effect (meaning this test can be used) for the duration of the COVID-19 declaration under Section 564(b)(1) of the Act, 21 U.S.C.section 360bbb-3(b)(1), unless the authorization is terminated  or revoked sooner.       Influenza A by PCR NEGATIVE NEGATIVE Final   Influenza B by PCR NEGATIVE NEGATIVE Final    Comment: (NOTE) The Xpert Xpress SARS-CoV-2/FLU/RSV plus assay is intended as an aid in the diagnosis of influenza from Nasopharyngeal swab specimens and should not be used as a sole basis for treatment. Nasal washings and aspirates are unacceptable for Xpert Xpress SARS-CoV-2/FLU/RSV testing.  Fact Sheet for Patients: EntrepreneurPulse.com.au  Fact Sheet for Healthcare Providers: IncredibleEmployment.be  This test is not yet approved or cleared by the [place] FDA and has been authorized for detection and/or diagnosis of SARS-CoV-2 by FDA under an Emergency Use Authorization (EUA). This EUA will remain in effect (meaning this test can be used) for the duration of the COVID-19 declaration under Section 564(b)(1) of the Act, 21 U.S.C. section 360bbb-3(b)(1), unless the authorization is terminated or revoked.  Performed at [facility], [place] [address], [place], [place] [address]   Surgical PCR screen     Status: Abnormal   Collection Time: 12/29/21  7:14 PM   Specimen: Nasal Mucosa; Nasal Swab  Result Value Ref Range Status   MRSA, PCR POSITIVE (A) NEGATIVE Final   Staphylococcus aureus POSITIVE (A) NEGATIVE Final    Comment: (NOTE) The Xpert SA Assay (FDA approved for NASAL specimens in patients 6 years of age and older), is one component of a comprehensive surveillance program. It is not intended to diagnose infection nor to guide or monitor treatment. Performed at [facility], [place] [address] [place], [place] [address]   Urine  Culture     Status: None   Collection Time: 12/30/21  9:20 AM   Specimen: Urine, Catheterized  Result Value Ref Range Status   Specimen Description   Final    URINE, CATHETERIZED Performed at [place] [address] [place], [place] [address]    Special Requests   Final    NONE Performed at [facility], [place] [address] [place], [place] [address]    Culture   Final    NO GROWTH Performed at [facility], [place] [address], [place], [place] [address]    Report Status 12/31/2021 FINAL  Final         Radiology Studies: Pelvis Portable  Result  Date: 12/30/2021 CLINICAL DATA:  Status post left total hip replacement, in PACU. EXAM: PORTABLE PELVIS 1-2 VIEWS COMPARISON:  Preoperative radiograph 12/28/2021 FINDINGS: Left hip arthroplasty in expected alignment. No periprosthetic lucency or fracture. Recent postsurgical change includes air and edema in the soft tissues. Lateral skin staples in place. IMPRESSION: Left hip arthroplasty without immediate postoperative complication. Electronically Signed   By: Keith Rake M.D.   On: 12/30/2021 21:36   DG C-Arm 1-60 Min-No Report  Result Date: 12/30/2021 Fluoroscopy was utilized by the requesting physician.  No radiographic interpretation.   DG C-Arm 1-60 Min-No Report  Result Date: 12/30/2021 Fluoroscopy was utilized by the requesting physician.  No radiographic interpretation.   DG HIP OPERATIVE UNILAT WITH PELVIS LEFT  Result Date: 12/30/2021 CLINICAL DATA:  Hip arthroplasty EXAM: OPERATIVE left HIP (WITH PELVIS IF PERFORMED) 2 VIEWS TECHNIQUE: Fluoroscopic spot image(s) were submitted for interpretation post-operatively. COMPARISON:  12/28/2021 FINDINGS: Total fluoroscopy time was 15 seconds, fluoroscopy dose of 1.56 mGy. The images demonstrate a left hip replacement with intact hardware and normal alignment. IMPRESSION: Intraoperative fluoroscopic assistance provided during left hip  replacement. Electronically Signed   By: Donavan Foil M.D.   On: 12/30/2021 19:48        Scheduled Meds:  sodium chloride   Intravenous Once   Chlorhexidine Gluconate Cloth  6 each Topical Daily   docusate sodium  100 mg Oral BID   enoxaparin (LOVENOX) injection  40 mg Subcutaneous Q24H   feeding supplement  237 mL Oral BID BM   gabapentin  600 mg Oral BID   levothyroxine  75 mcg Oral Q0600   multivitamin with minerals  1 tablet Oral BID   mupirocin ointment  1 application Nasal BID   polyethylene glycol  17 g Oral BID   senna  1 tablet Oral BID   sertraline  100 mg Oral QHS   vitamin B-12  1,000 mcg Oral Daily   Continuous Infusions:  sodium chloride 100 mL/hr at 12/31/21 1704     LOS: 4 days    Georgette Shell, MD 01/01/2022, 3:12 PM

## 2022-01-01 NOTE — Progress Notes (Signed)
° ° °  Subjective:  Patient reports pain as mild.  Denies N/V/CP/SOB. Received 1 units PRBCs for ABLA. Did well with PT today.  Objective:   VITALS:   Vitals:   01/01/22 1047 01/01/22 1048 01/01/22 1051 01/01/22 1337  BP: 123/74 113/79 117/80 124/70  Pulse: 87 96 100 86  Resp:    20  Temp:    97.8 F (36.6 C)  TempSrc:    Oral  SpO2: 91% 93% 92% 93%  Weight:      Height:        NAD ABD soft Sensation intact distally Intact pulses distally Dorsiflexion/Plantar flexion intact Incision: dressing C/D/I Compartment soft   Lab Results  Component Value Date   WBC 9.5 01/01/2022   HGB 9.3 (L) 01/01/2022   HCT 29.6 (L) 01/01/2022   MCV 82.5 01/01/2022   PLT 178 01/01/2022   BMET    Component Value Date/Time   NA 140 01/01/2022 0454   K 4.0 01/01/2022 0454   CL 106 01/01/2022 0454   CO2 29 01/01/2022 0454   GLUCOSE 113 (H) 01/01/2022 0454   BUN 20 01/01/2022 0454   CREATININE 0.59 01/01/2022 0454   CALCIUM 8.0 (L) 01/01/2022 0454   GFRNONAA >60 01/01/2022 0454     Assessment/Plan: 2 Days Post-Op   Principal Problem:   Closed left hip fracture (HCC) Active Problems:   History of major depression   Hypothyroidism   Normocytic anemia   Acute urinary retention   Chronic pain disorder   Malnutrition of moderate degree   WBAT with walker DVT ppx:  lovenox in house --> home on apixaban , SCDs, TEDS PO pain control PT/OT Dispo: D/C home with OPPT when ok with TRH   Traci Bird 01/01/2022, 6:39 PM   Rod Can, MD [phone] [place] [facility]   [place] [address] [place] [address], [place], [place] [address] Phone: [phone] [email or web address]   [employer]

## 2022-01-02 ENCOUNTER — Encounter (HOSPITAL_COMMUNITY): Payer: Self-pay | Admitting: Orthopedic Surgery

## 2022-01-02 DIAGNOSIS — S72002P Fracture of unspecified part of neck of left femur, subsequent encounter for closed fracture with malunion: Secondary | ICD-10-CM

## 2022-01-02 LAB — CBC
HCT: 29 % — ABNORMAL LOW (ref 36.0–46.0)
Hemoglobin: 9.2 g/dL — ABNORMAL LOW (ref 12.0–15.0)
MCH: 26.1 pg (ref 26.0–34.0)
MCHC: 31.7 g/dL (ref 30.0–36.0)
MCV: 82.4 fL (ref 80.0–100.0)
Platelets: 201 10*3/uL (ref 150–400)
RBC: 3.52 MIL/uL — ABNORMAL LOW (ref 3.87–5.11)
RDW: 15.9 % — ABNORMAL HIGH (ref 11.5–15.5)
WBC: 10.4 10*3/uL (ref 4.0–10.5)
nRBC: 0 % (ref 0.0–0.2)

## 2022-01-02 NOTE — Progress Notes (Signed)
Went over discharge instructions w/ pt. Pt verbalized understanding.  

## 2022-01-02 NOTE — Discharge Summary (Signed)
Physician Discharge Summary  Traci Bird JSE:[id number] DOB: Jul 15, 1961 DOA: 12/28/2021  PCP: Traci Booze, NP  Admit date: 12/28/2021 Discharge date: 01/02/2022  Admitted From: Home Disposition: Home  Recommendations for Outpatient Follow-up:  Follow up with PCP in 1-2 weeks Please obtain BMP/CBC in one week Please follow up with Ortho Home Health: Yes Equipment/Devices: None Discharge Condition: Stable CODE STATUS: Full code Diet recommendation: Cardiac Brief/Interim Summary:61 year old female history of chronic pain being followed at the chronic pain clinic, anxiety/depression, hypothyroidism gout up around 4 AM to get something to drink lost her balance and fell on her left side with inability to bear weight and left hip pain.  Patient presented to the ED plain films of the left hip and pelvis done concerning for left femoral neck fracture.  Orthopedics consulted and patient had surgical repair 12/30/2021.     Discharge Diagnoses:  Principal Problem:   Closed left hip fracture ([place]) Active Problems:   History of major depression   Hypothyroidism   Normocytic anemia   Acute urinary retention   Chronic pain disorder   Malnutrition of moderate degree      #1 impacted and displaced left femoral neck fracture -Secondary to mechanical fall. -Patient with chronic back pain on chronic opioid pain medication. -Plain films of the left hip and pelvis done with moderately impacted acute left femoral neck fracture. -Patient seen in consultation by orthopedics and patient status post left hip arthroplasty per Dr. Lyla Glassing 12/30/2021.   Patient was followed by Ortho during this hospital stay and recommended apixaban 2.5 mg twice daily for DVT prophylaxis on discharge.  She received Lovenox during the hospital stay. She is weightbearing as tolerated with walker.  2.  Chronic back pain on chronic opioid medications- -Patient noted to be in a pain management agreement with Dr Nelva Bush  of [facility]. -Continue Senokot-S twice daily, MiraLAX twice daily.   3.  Urinary retention-resolved.  4.  Hypothyroidism -Continue Synthroid.  5.  Depression/anxiety -Continue Zoloft, BuSpar as needed, Klonopin as needed.    6.  Normocytic anemia/postop acute blood loss anemia- -Likely secondary to anemia of chronic disease in the setting of postop acute blood loss anemia. -Patient with no overt GI bleed. -Anemia panel consistent with anemia of chronic disease.   -Hemoglobin 9.3 from from 7.7   transfused 2 units packed red blood cells.      Nutrition Problem: Moderate Malnutrition Etiology: chronic illness (h/o gastric bypass)    Signs/Symptoms: mild fat depletion, moderate muscle depletion     Interventions: Ensure Enlive (each supplement provides 350kcal and 20 grams of protein), MVI, Education  Estimated body mass index is 27.18 kg/m as calculated from the following:   Height as of this encounter: 5\' 7"  (1.702 m).   Weight as of this encounter: 78.7 kg.  Discharge Instructions  Discharge Instructions     Diet - low sodium heart healthy   Complete by: As directed    Increase activity slowly   Complete by: As directed    No wound care   Complete by: As directed       Allergies as of 01/02/2022       Reactions   Nsaids Other (See Comments)   Gastric bypass surgery   Morphine And Related Itching   Pump         Medication List     STOP taking these medications    docusate sodium 100 MG capsule Commonly known as: COLACE   ondansetron 4 MG tablet Commonly known  as: ZOFRAN   Oxycodone HCl 10 MG Tabs   oxyCODONE-acetaminophen 10-325 MG tablet Commonly known as: PERCOCET   zolpidem 10 MG tablet Commonly known as: AMBIEN       TAKE these medications    apixaban 2.5 MG Tabs tablet Commonly known as: ELIQUIS Take 1 tablet (2.5 mg total) by mouth 2 (two) times daily.   busPIRone 15 MG tablet Commonly known as: BUSPAR Take 15 mg by  mouth daily as needed (anxiety).   clonazePAM 0.5 MG tablet Commonly known as: KLONOPIN Take 0.5 mg by mouth 2 (two) times daily as needed for anxiety.   gabapentin 600 MG tablet Commonly known as: NEURONTIN Take 600 mg by mouth at bedtime.   gabapentin 300 MG capsule Commonly known as: NEURONTIN Take 300 mg by mouth 2 (two) times daily as needed for pain.   HYDROmorphone 2 MG tablet Commonly known as: DILAUDID Take 0.5 tablets (1 mg total) by mouth every 4 (four) hours as needed for severe pain (breakthrough pain).   levothyroxine 75 MCG tablet Commonly known as: SYNTHROID Take 75 mcg by mouth at bedtime.   promethazine 25 MG tablet Commonly known as: PHENERGAN Take 25 mg by mouth every 8 (eight) hours as needed for nausea/vomiting.   senna 8.6 MG Tabs tablet Commonly known as: SENOKOT Take 2 tablets (17.2 mg total) by mouth at bedtime.   sertraline 100 MG tablet Commonly known as: ZOLOFT Take 100 mg by mouth at bedtime.   tiZANidine 4 MG tablet Commonly known as: ZANAFLEX Take 4 mg by mouth at bedtime as needed for muscle spasms.   Vitamin D (Ergocalciferol) 1.25 MG (50000 UNIT) Caps capsule Commonly known as: DRISDOL Take 50,000 Units by mouth once a week.        Follow-up Information     Swinteck, Aaron Edelman, MD. Schedule an appointment as soon as possible for a visit in 2 week(s).   Specialty: Orthopedic Surgery Why: For wound re-check, For suture removal Contact information: [address] [place] [address] [phone]         Traci Booze, NP Follow up.   Specialty: Family Medicine Contact information: [place] [address] [place] [address] [phone]                Allergies  Allergen Reactions   Nsaids Other (See Comments)    Gastric bypass surgery   Morphine And Related Itching    Pump     Consultations: Ortho   Procedures/Studies: DG Lumbar Spine Complete  Result Date: 12/28/2021 CLINICAL DATA:   61 year old female status post fall stone at home. EXAM: LUMBAR SPINE - COMPLETE 4+ VIEW COMPARISON:  Lumbar radiographs 07/04/2017, CT lumbar myelogram 09/30/2013. FINDINGS: Osteopenia. Normal lumbar segmentation. Stable lumbar lordosis and disc spaces. Lumbar vertebrae appear intact. Visible lower thoracic levels appear grossly intact. Chronic bilateral L5-S1 facet arthropathy. Increased density in the pelvis is presumed to be bladder distension. Negative visible bowel gas. IMPRESSION: 1. No acute osseous abnormality identified in the lumbar spine. 2. Query distended urinary bladder. Electronically Signed   By: Genevie Ann M.D.   On: 12/28/2021 08:44   Pelvis Portable  Result Date: 12/30/2021 CLINICAL DATA:  Status post left total hip replacement, in PACU. EXAM: PORTABLE PELVIS 1-2 VIEWS COMPARISON:  Preoperative radiograph 12/28/2021 FINDINGS: Left hip arthroplasty in expected alignment. No periprosthetic lucency or fracture. Recent postsurgical change includes air and edema in the soft tissues. Lateral skin staples in place. IMPRESSION: Left hip arthroplasty without  immediate postoperative complication. Electronically Signed   By: Keith Rake M.D.   On: 12/30/2021 21:36   DG C-Arm 1-60 Min-No Report  Result Date: 12/30/2021 Fluoroscopy was utilized by the requesting physician.  No radiographic interpretation.   DG C-Arm 1-60 Min-No Report  Result Date: 12/30/2021 Fluoroscopy was utilized by the requesting physician.  No radiographic interpretation.   DG HIP OPERATIVE UNILAT WITH PELVIS LEFT  Result Date: 12/30/2021 CLINICAL DATA:  Hip arthroplasty EXAM: OPERATIVE left HIP (WITH PELVIS IF PERFORMED) 2 VIEWS TECHNIQUE: Fluoroscopic spot image(s) were submitted for interpretation post-operatively. COMPARISON:  12/28/2021 FINDINGS: Total fluoroscopy time was 15 seconds, fluoroscopy dose of 1.56 mGy. The images demonstrate a left hip replacement with intact hardware and normal alignment. IMPRESSION:  Intraoperative fluoroscopic assistance provided during left hip replacement. Electronically Signed   By: Donavan Foil M.D.   On: 12/30/2021 19:48   DG HIP UNILAT WITH PELVIS 2-3 VIEWS LEFT  Result Date: 12/28/2021 CLINICAL DATA:  61 year old female status post fall at home. EXAM: DG HIP (WITH OR WITHOUT PELVIS) 2-3V LEFT COMPARISON:  None available. FINDINGS: Moderately impacted left femoral neck fracture, with mild lateral displacement. Femoral head remains normally located. Left intertrochanteric segment appears intact. No superimposed acute pelvic fracture identified. Grossly intact proximal right femur. Pelvic phleboliths. Negative visible bowel gas. IMPRESSION: Moderately impacted acute left femoral neck fracture. Electronically Signed   By: Genevie Ann M.D.   On: 12/28/2021 08:41   (Echo, Carotid, EGD, Colonoscopy, ERCP)    Subjective: She is resting in bed she feels better dizziness is better pain is better she is anxious to go home  Discharge Exam: Vitals:   01/01/22 2158 01/02/22 0348  BP: 133/85 132/66  Pulse: 99 94  Resp: 17 18  Temp: (!) 100.5 F (38.1 C) 99.3 F (37.4 C)  SpO2: 97% 97%   Vitals:   01/01/22 1051 01/01/22 1337 01/01/22 2158 01/02/22 0348  BP: 117/80 124/70 133/85 132/66  Pulse: 100 86 99 94  Resp:  20 17 18   Temp:  97.8 F (36.6 C) (!) 100.5 F (38.1 C) 99.3 F (37.4 C)  TempSrc:  Oral    SpO2: 92% 93% 97% 97%  Weight:      Height:        General: Pt is alert, awake, not in acute distress Cardiovascular: RRR, S1/S2 +, no rubs, no gallops Respiratory: CTA bilaterally, no wheezing, no rhonchi Abdominal: Soft, NT, ND, bowel sounds + Extremities: no edema, no cyanosis Incision covered with dressing.    The results of significant diagnostics from this hospitalization (including imaging, microbiology, ancillary and laboratory) are listed below for reference.     Microbiology: Recent Results (from the past 240 hour(s))  Resp Panel by RT-PCR (Flu  A&B, Covid) Nasopharyngeal Swab     Status: None   Collection Time: 12/28/21 10:11 AM   Specimen: Nasopharyngeal Swab; Nasopharyngeal(NP) swabs in vial transport medium  Result Value Ref Range Status   SARS Coronavirus 2 by RT PCR NEGATIVE NEGATIVE Final    Comment: (NOTE) SARS-CoV-2 target nucleic acids are NOT DETECTED.  The SARS-CoV-2 RNA is generally detectable in upper respiratory specimens during the acute phase of infection. The lowest concentration of SARS-CoV-2 viral copies this assay can detect is 138 copies/mL. A negative result does not preclude SARS-Cov-2 infection and should not be used as the sole basis for treatment or other patient management decisions. A negative result may occur with  improper specimen collection/handling, submission of specimen other than nasopharyngeal swab, presence  of viral mutation(s) within the areas targeted by this assay, and inadequate number of viral copies(<138 copies/mL). A negative result must be combined with clinical observations, patient history, and epidemiological information. The expected result is Negative.  Fact Sheet for Patients:  EntrepreneurPulse.com.au  Fact Sheet for Healthcare Providers:  IncredibleEmployment.be  This test is no t yet approved or cleared by the [place] FDA and  has been authorized for detection and/or diagnosis of SARS-CoV-2 by FDA under an Emergency Use Authorization (EUA). This EUA will remain  in effect (meaning this test can be used) for the duration of the COVID-19 declaration under Section 564(b)(1) of the Act, 21 U.S.C.section 360bbb-3(b)(1), unless the authorization is terminated  or revoked sooner.       Influenza A by PCR NEGATIVE NEGATIVE Final   Influenza B by PCR NEGATIVE NEGATIVE Final    Comment: (NOTE) The Xpert Xpress SARS-CoV-2/FLU/RSV plus assay is intended as an aid in the diagnosis of influenza from Nasopharyngeal swab specimens  and should not be used as a sole basis for treatment. Nasal washings and aspirates are unacceptable for Xpert Xpress SARS-CoV-2/FLU/RSV testing.  Fact Sheet for Patients: EntrepreneurPulse.com.au  Fact Sheet for Healthcare Providers: IncredibleEmployment.be  This test is not yet approved or cleared by the [place] FDA and has been authorized for detection and/or diagnosis of SARS-CoV-2 by FDA under an Emergency Use Authorization (EUA). This EUA will remain in effect (meaning this test can be used) for the duration of the COVID-19 declaration under Section 564(b)(1) of the Act, 21 U.S.C. section 360bbb-3(b)(1), unless the authorization is terminated or revoked.  Performed at [facility], [place] [address], [place], [place] [address]   Surgical PCR screen     Status: Abnormal   Collection Time: 12/29/21  7:14 PM   Specimen: Nasal Mucosa; Nasal Swab  Result Value Ref Range Status   MRSA, PCR POSITIVE (A) NEGATIVE Final   Staphylococcus aureus POSITIVE (A) NEGATIVE Final    Comment: (NOTE) The Xpert SA Assay (FDA approved for NASAL specimens in patients 3 years of age and older), is one component of a comprehensive surveillance program. It is not intended to diagnose infection nor to guide or monitor treatment. Performed at [facility], [place] [address], [place], [place] [address]   Urine Culture     Status: None   Collection Time: 12/30/21  9:20 AM   Specimen: Urine, Catheterized  Result Value Ref Range Status   Specimen Description   Final    URINE, CATHETERIZED Performed at [place] [address] [place], [place] [address]    Special Requests   Final    NONE Performed at [facility], [place] [address] [place], [place] [address]    Culture   Final    NO GROWTH Performed at [facility], [place] [address] [place],  [address]    Report  Status 12/31/2021 FINAL  Final     Labs: BNP (last 3 results) No results for input(s): BNP in the last 8760 hours. Basic Metabolic Panel: Recent Labs  Lab 12/28/21 0831 12/29/21 0444 12/30/21 0459 12/30/21 2025 12/31/21 0513 01/01/22 0454  NA 138 137 137  --  135 140  K 3.7 3.8 3.7  --  4.3 4.0  CL 107 104 103  --  102 106  CO2 23 28 27   --  28 29  GLUCOSE 89 100* 120*  --  167* 113*  BUN 18 12 10   --  15 20  CREATININE 0.64 0.69 0.59 0.72 0.66 0.59  CALCIUM 8.2* 8.6* 9.0  --  8.5* 8.0*  MG  --   --  2.0  --   --   --    Liver Function Tests: Recent Labs  Lab 12/28/21 0831  AST 22  ALT 15  ALKPHOS 77  BILITOT 0.4  PROT 6.4*  ALBUMIN 3.3*   No results for input(s): LIPASE, AMYLASE in the last 168 hours. No results for input(s): AMMONIA in the last 168 hours. CBC: Recent Labs  Lab 12/28/21 0831 12/29/21 0444 12/30/21 0459 12/30/21 2025 12/31/21 0513 12/31/21 1439 01/01/22 0454 01/02/22 0445  WBC 10.5   < > 7.6 11.3* 9.6  --  9.5 10.4  NEUTROABS 7.3  --  5.4  --   --   --   --   --   HGB 10.7*   < > 11.6* 10.2* 8.8* 7.7* 9.3* 9.2*  HCT 35.2*   < > 37.6 33.3* 28.7* 25.2* 29.6* 29.0*  MCV 84.8   < > 83.2 83.9 83.4  --  82.5 82.4  PLT 190   < > 196 222 191  --  178 201   < > = values in this interval not displayed.   Cardiac Enzymes: No results for input(s): CKTOTAL, CKMB, CKMBINDEX, TROPONINI in the last 168 hours. BNP: Invalid input(s): POCBNP CBG: Recent Labs  Lab 12/28/21 0654 12/28/21 0657  GLUCAP 73 77   D-Dimer No results for input(s): DDIMER in the last 72 hours. Hgb A1c No results for input(s): HGBA1C in the last 72 hours. Lipid Profile No results for input(s): CHOL, HDL, LDLCALC, TRIG, CHOLHDL, LDLDIRECT in the last 72 hours. Thyroid function studies No results for input(s): TSH, T4TOTAL, T3FREE, THYROIDAB in the last 72 hours.  Invalid input(s): FREET3 Anemia work up Recent Labs    12/31/21 0513  VITAMINB12 195  FOLATE 18.8   FERRITIN 35  TIBC 398  IRON 21*   Urinalysis    Component Value Date/Time   COLORURINE YELLOW 12/28/2021 0831   APPEARANCEUR CLEAR 12/28/2021 0831   LABSPEC 1.019 12/28/2021 0831   PHURINE 5.0 12/28/2021 0831   GLUCOSEU NEGATIVE 12/28/2021 0831   HGBUR NEGATIVE 12/28/2021 0831   BILIRUBINUR NEGATIVE 12/28/2021 0831   KETONESUR NEGATIVE 12/28/2021 0831   PROTEINUR NEGATIVE 12/28/2021 0831   UROBILINOGEN 1.0 03/14/2015 0915   NITRITE NEGATIVE 12/28/2021 0831   LEUKOCYTESUR MODERATE (A) 12/28/2021 0831   Sepsis Labs Invalid input(s): PROCALCITONIN,  WBC,  LACTICIDVEN Microbiology Recent Results (from the past 240 hour(s))  Resp Panel by RT-PCR (Flu A&B, Covid) Nasopharyngeal Swab     Status: None   Collection Time: 12/28/21 10:11 AM   Specimen: Nasopharyngeal Swab; Nasopharyngeal(NP) swabs in vial transport medium  Result Value Ref Range Status   SARS Coronavirus 2 by RT PCR NEGATIVE NEGATIVE Final    Comment: (NOTE) SARS-CoV-2 target nucleic acids are NOT DETECTED.  The SARS-CoV-2 RNA is generally detectable in upper respiratory specimens during the acute phase of infection. The lowest concentration of SARS-CoV-2 viral copies this assay can detect is 138 copies/mL. A negative result does not preclude SARS-Cov-2 infection and should not be used as the sole basis for treatment or other patient management decisions. A negative result may occur with  improper specimen collection/handling, submission of specimen other than nasopharyngeal swab, presence of viral mutation(s) within the areas targeted by this assay, and inadequate number of viral copies(<138 copies/mL). A negative result must be combined with clinical observations,  patient history, and epidemiological information. The expected result is Negative.  Fact Sheet for Patients:  EntrepreneurPulse.com.au  Fact Sheet for Healthcare Providers:  IncredibleEmployment.be  This test is  no t yet approved or cleared by the [place] FDA and  has been authorized for detection and/or diagnosis of SARS-CoV-2 by FDA under an Emergency Use Authorization (EUA). This EUA will remain  in effect (meaning this test can be used) for the duration of the COVID-19 declaration under Section 564(b)(1) of the Act, 21 U.S.C.section 360bbb-3(b)(1), unless the authorization is terminated  or revoked sooner.       Influenza A by PCR NEGATIVE NEGATIVE Final   Influenza B by PCR NEGATIVE NEGATIVE Final    Comment: (NOTE) The Xpert Xpress SARS-CoV-2/FLU/RSV plus assay is intended as an aid in the diagnosis of influenza from Nasopharyngeal swab specimens and should not be used as a sole basis for treatment. Nasal washings and aspirates are unacceptable for Xpert Xpress SARS-CoV-2/FLU/RSV testing.  Fact Sheet for Patients: EntrepreneurPulse.com.au  Fact Sheet for Healthcare Providers: IncredibleEmployment.be  This test is not yet approved or cleared by the [place] FDA and has been authorized for detection and/or diagnosis of SARS-CoV-2 by FDA under an Emergency Use Authorization (EUA). This EUA will remain in effect (meaning this test can be used) for the duration of the COVID-19 declaration under Section 564(b)(1) of the Act, 21 U.S.C. section 360bbb-3(b)(1), unless the authorization is terminated or revoked.  Performed at [facility], [place] [address] [place], [place] [address]   Surgical PCR screen     Status: Abnormal   Collection Time: 12/29/21  7:14 PM   Specimen: Nasal Mucosa; Nasal Swab  Result Value Ref Range Status   MRSA, PCR POSITIVE (A) NEGATIVE Final   Staphylococcus aureus POSITIVE (A) NEGATIVE Final    Comment: (NOTE) The Xpert SA Assay (FDA approved for NASAL specimens in patients 64 years of age and older), is one component of a comprehensive surveillance program. It is not intended to diagnose  infection nor to guide or monitor treatment. Performed at [facility], [place] [address] [place], [place] [address]   Urine Culture     Status: None   Collection Time: 12/30/21  9:20 AM   Specimen: Urine, Catheterized  Result Value Ref Range Status   Specimen Description   Final    URINE, CATHETERIZED Performed at [place] [address] [place], [place] [address]    Special Requests   Final    NONE Performed at [facility], [place] [address], [place], [place] [address]    Culture   Final    NO GROWTH Performed at [facility], [place] [address] [place], [place] [address]    Report Status 12/31/2021 FINAL  Final     Time coordinating discharge: 39 minutes  SIGNED:   Georgette Shell, MD  [facility] 01/02/2022, 1:50 PM

## 2022-01-02 NOTE — Progress Notes (Signed)
Physical Therapy Treatment Patient Details Name: Traci Bird MRN: [id number] DOB: [birth year] Today's Date: 01/02/2022   History of Present Illness 61 yo female admitted with L hip fx after falling at home. 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 Hx of falls, DM, L TKA, chronic pain    PT Comments    Pt assisted with ambulating to bathroom and around room.  Pt declined ambulating in hallway as she will d/c home today.  Pt and spouse feel ready for pt to return home.  Spouse brought w/c from home as well.  Pt plans to f/u with OPPT.    Recommendations for follow up therapy are one component of a multi-disciplinary discharge planning process, led by the attending physician.  Recommendations may be updated based on patient status, additional functional criteria and insurance authorization.  Follow Up Recommendations  Outpatient PT     Assistance Recommended at Discharge Frequent or constant Supervision/Assistance  Patient can return home with the following A little help with walking and/or transfers;A little help with bathing/dressing/bathroom;Help with stairs or ramp for entrance;Assistance with cooking/housework;Assist for transportation   Equipment Recommendations  None recommended by PT    Recommendations for Other Services       Precautions / Restrictions Precautions Precautions: Fall Restrictions LLE Weight Bearing: Weight bearing as tolerated     Mobility  Bed Mobility Overal bed mobility: Needs Assistance Bed Mobility: Supine to Sit     Supine to sit: HOB elevated, Supervision     General bed mobility comments: Supv for safety. Increased time. pt self assisted LE    Transfers Overall transfer level: Needs assistance Equipment used: Rolling walker (2 wheels) Transfers: Sit to/from Stand Sit to Stand: Min guard, Supervision           General transfer comment: verbal cues for hand placement    Ambulation/Gait Ambulation/Gait assistance: Min guard, Supervision Gait  Distance (Feet): 20 Feet Assistive device: Rolling walker (2 wheels) Gait Pattern/deviations: Step-through pattern, Decreased stride length       General Gait Details: close guard for safety however pt denies any symptoms, pt ambulated around room and declined further ambulation as she will be discharging home today   Stairs             Wheelchair Mobility    Modified Rankin (Stroke Patients Only)       Balance                                            Cognition Arousal/Alertness: Awake/alert Behavior During Therapy: WFL for tasks assessed/performed Overall Cognitive Status: Within Functional Limits for tasks assessed                                 General Comments: HOH        Exercises      General Comments        Pertinent Vitals/Pain Pain Assessment Pain Assessment: Faces Faces Pain Scale: Hurts a little bit Pain Location: L hip/thigh Pain Descriptors / Indicators: Discomfort, Sore, Aching Pain Intervention(s): Repositioned, Monitored during session    Home Living                          Prior Function            PT Goals (current goals  can now be found in the care plan section) Progress towards PT goals: Progressing toward goals    Frequency    7X/week      PT Plan Current plan remains appropriate    Co-evaluation              AM-PAC PT "6 Clicks" Mobility   Outcome Measure  Help needed turning from your back to your side while in a flat bed without using bedrails?: A Little Help needed moving from lying on your back to sitting on the side of a flat bed without using bedrails?: A Little Help needed moving to and from a bed to a chair (including a wheelchair)?: A Little Help needed standing up from a chair using your arms (e.g., wheelchair or bedside chair)?: A Little Help needed to walk in hospital room?: A Little Help needed climbing 3-5 steps with a railing? : A Little 6 Click  Score: 18    End of Session Equipment Utilized During Treatment: Gait belt Activity Tolerance: Patient tolerated treatment well Patient left: with call bell/phone within reach;with family/visitor present;in chair   PT Visit Diagnosis: Other abnormalities of gait and mobility (R26.89);History of falling (Z91.81)     Time: 5686-1683 PT Time Calculation (min) (ACUTE ONLY): 16 min  Charges:  $Gait Training: 8-22 mins                    Jannette Spanner PT, DPT Acute Rehabilitation Services Pager: [phone] Office: [place] 01/02/2022, 12:56 PM

## 2024-03-02 ENCOUNTER — Other Ambulatory Visit: Payer: Self-pay

## 2024-03-02 ENCOUNTER — Encounter (HOSPITAL_BASED_OUTPATIENT_CLINIC_OR_DEPARTMENT_OTHER): Payer: Self-pay

## 2024-03-02 ENCOUNTER — Emergency Department (HOSPITAL_BASED_OUTPATIENT_CLINIC_OR_DEPARTMENT_OTHER)
Admission: EM | Admit: 2024-03-02 | Discharge: 2024-03-02 | Disposition: A | Discharge status: Home / Self Care | Attending: Emergency Medicine | Admitting: Emergency Medicine

## 2024-03-02 ENCOUNTER — Emergency Department (HOSPITAL_BASED_OUTPATIENT_CLINIC_OR_DEPARTMENT_OTHER): Admitting: Radiology

## 2024-03-02 DIAGNOSIS — Z7901 Long term (current) use of anticoagulants: Secondary | ICD-10-CM | POA: Insufficient documentation

## 2024-03-02 DIAGNOSIS — Z96652 Presence of left artificial knee joint: Secondary | ICD-10-CM | POA: Insufficient documentation

## 2024-03-02 DIAGNOSIS — M7989 Other specified soft tissue disorders: Secondary | ICD-10-CM | POA: Diagnosis not present

## 2024-03-02 DIAGNOSIS — S8002XA Contusion of left knee, initial encounter: Secondary | ICD-10-CM | POA: Insufficient documentation

## 2024-03-02 DIAGNOSIS — W19XXXA Unspecified fall, initial encounter: Secondary | ICD-10-CM | POA: Insufficient documentation

## 2024-03-02 DIAGNOSIS — Y92009 Unspecified place in unspecified non-institutional (private) residence as the place of occurrence of the external cause: Secondary | ICD-10-CM | POA: Diagnosis not present

## 2024-03-02 DIAGNOSIS — S8992XA Unspecified injury of left lower leg, initial encounter: Secondary | ICD-10-CM | POA: Diagnosis present

## 2024-03-02 MED ORDER — OXYCODONE-ACETAMINOPHEN 5-325 MG PO TABS
1.0000 | ORAL_TABLET | Freq: Once | ORAL | Status: AC
  Administered 2024-03-02: 1 via ORAL
  Filled 2024-03-02: qty 1

## 2024-03-02 MED ORDER — OXYCODONE-ACETAMINOPHEN 5-325 MG PO TABS: 1.0000 | ORAL_TABLET | Freq: Four times a day (QID) | ORAL | 0 refills | Status: AC | PRN

## 2024-03-02 NOTE — ED Provider Notes (Signed)
 [place] EMERGENCY DEPARTMENT AT [facility] Provider Note   CSN: [id number] Arrival date & time: 03/02/24  1409     History  Chief Complaint  Patient presents with   Fall   Knee Pain    left    Traci Bird is a 63 y.o. female.  Patient to ED for evaluation of left knee pain after fall this morning, landing on the knee. History of knee replacement. No other injury. Not anticoagulated.   The history is provided by the patient. No language interpreter was used.  Fall  Knee Pain      Home Medications Prior to Admission medications   Medication Sig Start Date End Date Taking? Authorizing Provider  oxyCODONE-acetaminophen (PERCOCET/ROXICET) 5-325 MG tablet Take 1 tablet by mouth every 6 (six) hours as needed for severe pain (pain score 7-10). 03/02/24  Yes Johanny Segers, PA-C  apixaban (ELIQUIS) 2.5 MG TABS tablet Take 1 tablet (2.5 mg total) by mouth 2 (two) times daily. 01/01/22   Swinteck, Arlys John, MD  busPIRone (BUSPAR) 15 MG tablet Take 15 mg by mouth daily as needed (anxiety).     [provider]  clonazePAM (KLONOPIN) 0.5 MG tablet Take 0.5 mg by mouth 2 (two) times daily as needed for anxiety.  03/26/20   [provider]  gabapentin (NEURONTIN) 300 MG capsule Take 300 mg by mouth 2 (two) times daily as needed for pain. 12/14/21   [provider]  gabapentin (NEURONTIN) 600 MG tablet Take 600 mg by mouth at bedtime.    [provider]  HYDROmorphone (DILAUDID) 2 MG tablet Take 0.5 tablets (1 mg total) by mouth every 4 (four) hours as needed for severe pain (breakthrough pain). 01/01/22   Swinteck, Arlys John, MD  levothyroxine (SYNTHROID, LEVOTHROID) 75 MCG tablet Take 75 mcg by mouth at bedtime.  09/09/15   [provider]  promethazine (PHENERGAN) 25 MG tablet Take 25 mg by mouth every 8 (eight) hours as needed for nausea/vomiting. 05/21/20   [provider]  senna (SENOKOT) 8.6 MG TABS tablet Take 2 tablets (17.2 mg  total) by mouth at bedtime. Patient not taking: Reported on 12/28/2021 07/06/20   Samson Frederic, MD  sertraline (ZOLOFT) 100 MG tablet Take 100 mg by mouth at bedtime. 03/01/20   [provider]  tiZANidine (ZANAFLEX) 4 MG tablet Take 4 mg by mouth at bedtime as needed for muscle spasms.     [provider]  Vitamin D, Ergocalciferol, (DRISDOL) 1.25 MG (50000 UNIT) CAPS capsule Take 50,000 Units by mouth once a week. 03/27/20   [provider]      Allergies    Nsaids and Morphine and codeine    Review of Systems   Review of Systems  Physical Exam Updated Vital Signs BP 118/73 (BP Location: Left Arm)   Pulse 63   Temp 98.6 F (37 C)   Resp 18   Ht 5\' 7"  (1.702 m)   Wt 75.8 kg   SpO2 99%   BMI 26.16 kg/m  Physical Exam Constitutional:      General: She is not in acute distress.    Appearance: She is well-developed. She is not ill-appearing.  Pulmonary:     Effort: Pulmonary effort is normal.  Musculoskeletal:        General: Normal range of motion.     Cervical back: Normal range of motion.     Comments: Left knee has a well healed midline surgical scar. No significant swelling, no deformity, no discoloration.  Functional ROM intact with discomfort. Knee joint is stable.   Skin:    General: Skin is warm and dry.  Neurological:     Mental Status: She is alert and oriented to person, place, and time.     ED Results / Procedures / Treatments   Labs (all labs ordered are listed, but only abnormal results are displayed) Labs Reviewed - No data to display  EKG None  Radiology DG Knee Complete 4 Views Left Result Date: 03/02/2024 CLINICAL DATA:  Knee pain.  History of fall. EXAM: LEFT KNEE - COMPLETE 4+ VIEW COMPARISON:  07/05/2020 FINDINGS: Total left knee arthroplasty. Left knee is located without a periprosthetic fracture. No evidence for a large joint effusion. Alignment is within normal limits. 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 Electronically Signed   By: Richarda Overlie M.D.   On: 03/02/2024 16:21    Procedures Procedures    Medications Ordered in ED Medications  oxyCODONE-acetaminophen (PERCOCET/ROXICET) 5-325 MG per tablet 1 tablet (1 tablet Oral Given 03/02/24 1546)    ED Course/ Medical Decision Making/ A&P Clinical Course as of 03/02/24 1647  Wed Mar 02, 2024  1647 Pain improved with single dose percocet. Knee xray unremarkable. Patient reassured. PCP follow up recommended. [SU]    Clinical Course User Index [SU] Elpidio Anis, PA-C                                 Medical Decision Making Amount and/or Complexity of Data Reviewed Radiology: ordered.  Risk Prescription drug management.           Final Clinical Impression(s) / ED Diagnoses Final diagnoses:  Contusion of left knee, initial encounter    Rx / DC Orders ED Discharge Orders          Ordered    oxyCODONE-acetaminophen (PERCOCET/ROXICET) 5-325 MG tablet  Every 6 hours PRN        03/02/24 1643              Elpidio Anis, PA-C 03/02/24 1647    Franne Forts, DO 03/04/24 1545

## 2024-03-02 NOTE — ED Triage Notes (Signed)
 Patient arrives POV with complaints of left leg/knee pain. Patient states that she had a mechanical fall at home, landing on her knee. Patient had the knee replaced x2.

## 2024-03-02 NOTE — ED Notes (Signed)
 Discharge paperwork given and verbally understood.

## 2024-03-02 NOTE — Discharge Instructions (Signed)
 Follow up with your doctor if needed if pain is no better in 3-4 days.
